# Patient Record
Sex: Female | Born: 1950 | Race: White | Hispanic: No | Marital: Married | Smoking: Former smoker
Health system: Southern US, Community
[De-identification: ages and names within clinical notes are randomized; demographics above are authoritative.]

## PROBLEM LIST (undated history)

## (undated) DIAGNOSIS — M797 Fibromyalgia: Secondary | ICD-10-CM

## (undated) DIAGNOSIS — M19072 Primary osteoarthritis, left ankle and foot: Secondary | ICD-10-CM

## (undated) DIAGNOSIS — F419 Anxiety disorder, unspecified: Secondary | ICD-10-CM

## (undated) DIAGNOSIS — F32A Depression, unspecified: Secondary | ICD-10-CM

## (undated) DIAGNOSIS — E785 Hyperlipidemia, unspecified: Secondary | ICD-10-CM

## (undated) DIAGNOSIS — F329 Major depressive disorder, single episode, unspecified: Secondary | ICD-10-CM

## (undated) DIAGNOSIS — D1803 Hemangioma of intra-abdominal structures: Secondary | ICD-10-CM

## (undated) DIAGNOSIS — I219 Acute myocardial infarction, unspecified: Secondary | ICD-10-CM

## (undated) DIAGNOSIS — K760 Fatty (change of) liver, not elsewhere classified: Secondary | ICD-10-CM

## (undated) DIAGNOSIS — T7840XA Allergy, unspecified, initial encounter: Secondary | ICD-10-CM

## (undated) DIAGNOSIS — N3281 Overactive bladder: Secondary | ICD-10-CM

## (undated) DIAGNOSIS — M7731 Calcaneal spur, right foot: Secondary | ICD-10-CM

## (undated) DIAGNOSIS — J45909 Unspecified asthma, uncomplicated: Secondary | ICD-10-CM

## (undated) DIAGNOSIS — M1990 Unspecified osteoarthritis, unspecified site: Secondary | ICD-10-CM

## (undated) DIAGNOSIS — K5790 Diverticulosis of intestine, part unspecified, without perforation or abscess without bleeding: Secondary | ICD-10-CM

## (undated) DIAGNOSIS — D251 Intramural leiomyoma of uterus: Secondary | ICD-10-CM

## (undated) DIAGNOSIS — K219 Gastro-esophageal reflux disease without esophagitis: Secondary | ICD-10-CM

## (undated) DIAGNOSIS — M19071 Primary osteoarthritis, right ankle and foot: Secondary | ICD-10-CM

## (undated) DIAGNOSIS — I639 Cerebral infarction, unspecified: Secondary | ICD-10-CM

## (undated) DIAGNOSIS — I10 Essential (primary) hypertension: Secondary | ICD-10-CM

## (undated) DIAGNOSIS — K648 Other hemorrhoids: Secondary | ICD-10-CM

## (undated) DIAGNOSIS — Z860101 Personal history of adenomatous and serrated colon polyps: Secondary | ICD-10-CM

## (undated) DIAGNOSIS — K222 Esophageal obstruction: Secondary | ICD-10-CM

## (undated) DIAGNOSIS — M7732 Calcaneal spur, left foot: Secondary | ICD-10-CM

## (undated) DIAGNOSIS — J40 Bronchitis, not specified as acute or chronic: Secondary | ICD-10-CM

## (undated) DIAGNOSIS — G250 Essential tremor: Secondary | ICD-10-CM

## (undated) DIAGNOSIS — K2270 Barrett's esophagus without dysplasia: Secondary | ICD-10-CM

## (undated) DIAGNOSIS — Z86010 Personal history of colonic polyps: Secondary | ICD-10-CM

## (undated) HISTORY — DX: Hyperlipidemia, unspecified: E78.5

## (undated) HISTORY — DX: Allergy, unspecified, initial encounter: T78.40XA

## (undated) HISTORY — DX: Primary osteoarthritis, left ankle and foot: M19.072

## (undated) HISTORY — DX: Barrett's esophagus without dysplasia: K22.70

## (undated) HISTORY — DX: Fibromyalgia: M79.7

## (undated) HISTORY — DX: Other hemorrhoids: K64.8

## (undated) HISTORY — DX: Personal history of colonic polyps: Z86.010

## (undated) HISTORY — DX: Esophageal obstruction: K22.2

## (undated) HISTORY — DX: Calcaneal spur, right foot: M77.31

## (undated) HISTORY — DX: Bronchitis, not specified as acute or chronic: J40

## (undated) HISTORY — DX: Hemangioma of intra-abdominal structures: D18.03

## (undated) HISTORY — DX: Primary osteoarthritis, right ankle and foot: M19.071

## (undated) HISTORY — DX: Anxiety disorder, unspecified: F41.9

## (undated) HISTORY — DX: Calcaneal spur, left foot: M77.32

## (undated) HISTORY — DX: Fatty (change of) liver, not elsewhere classified: K76.0

## (undated) HISTORY — DX: Diverticulosis of intestine, part unspecified, without perforation or abscess without bleeding: K57.90

## (undated) HISTORY — DX: Intramural leiomyoma of uterus: D25.1

## (undated) HISTORY — PX: OTHER SURGICAL HISTORY: SHX169

## (undated) HISTORY — PX: INCONTINENCE SURGERY: SHX676

## (undated) HISTORY — DX: Personal history of adenomatous and serrated colon polyps: Z86.0101

## (undated) HISTORY — DX: Cerebral infarction, unspecified: I63.9

## (undated) HISTORY — DX: Depression, unspecified: F32.A

## (undated) HISTORY — DX: Essential (primary) hypertension: I10

## (undated) HISTORY — DX: Acute myocardial infarction, unspecified: I21.9

## (undated) HISTORY — DX: Unspecified asthma, uncomplicated: J45.909

## (undated) HISTORY — DX: Gastro-esophageal reflux disease without esophagitis: K21.9

## (undated) HISTORY — DX: Unspecified osteoarthritis, unspecified site: M19.90

## (undated) HISTORY — DX: Major depressive disorder, single episode, unspecified: F32.9

## (undated) HISTORY — PX: TUBAL LIGATION: SHX77

## (undated) HISTORY — PX: ABDOMINAL HYSTERECTOMY: SHX81

---

## 1998-05-15 ENCOUNTER — Other Ambulatory Visit: Admission: RE | Admit: 1998-05-15 | Discharge: 1998-05-15 | Payer: Self-pay | Admitting: Obstetrics and Gynecology

## 1999-11-07 ENCOUNTER — Other Ambulatory Visit: Admission: RE | Admit: 1999-11-07 | Discharge: 1999-11-07 | Payer: Self-pay | Admitting: Obstetrics and Gynecology

## 1999-12-02 ENCOUNTER — Emergency Department (HOSPITAL_COMMUNITY): Admission: EM | Admit: 1999-12-02 | Discharge: 1999-12-02 | Payer: Self-pay | Admitting: Emergency Medicine

## 1999-12-03 ENCOUNTER — Encounter: Admission: RE | Admit: 1999-12-03 | Discharge: 1999-12-03 | Payer: Self-pay | Admitting: Family Medicine

## 1999-12-03 ENCOUNTER — Encounter: Payer: Self-pay | Admitting: Family Medicine

## 2000-10-01 ENCOUNTER — Encounter: Payer: Self-pay | Admitting: Obstetrics and Gynecology

## 2000-10-01 ENCOUNTER — Encounter: Admission: RE | Admit: 2000-10-01 | Discharge: 2000-10-01 | Payer: Self-pay | Admitting: Obstetrics and Gynecology

## 2001-12-15 ENCOUNTER — Other Ambulatory Visit: Admission: RE | Admit: 2001-12-15 | Discharge: 2001-12-15 | Payer: Self-pay | Admitting: Obstetrics and Gynecology

## 2002-05-16 ENCOUNTER — Emergency Department (HOSPITAL_COMMUNITY): Admission: EM | Admit: 2002-05-16 | Discharge: 2002-05-17 | Payer: Self-pay | Admitting: Emergency Medicine

## 2002-05-16 ENCOUNTER — Encounter: Payer: Self-pay | Admitting: Emergency Medicine

## 2003-01-09 ENCOUNTER — Other Ambulatory Visit: Admission: RE | Admit: 2003-01-09 | Discharge: 2003-01-09 | Payer: Self-pay | Admitting: Obstetrics and Gynecology

## 2004-03-25 ENCOUNTER — Other Ambulatory Visit: Admission: RE | Admit: 2004-03-25 | Discharge: 2004-03-25 | Payer: Self-pay | Admitting: Obstetrics and Gynecology

## 2004-05-16 ENCOUNTER — Encounter: Payer: Self-pay | Admitting: Internal Medicine

## 2004-09-01 DIAGNOSIS — I639 Cerebral infarction, unspecified: Secondary | ICD-10-CM

## 2004-09-01 HISTORY — DX: Cerebral infarction, unspecified: I63.9

## 2005-01-13 ENCOUNTER — Encounter (INDEPENDENT_AMBULATORY_CARE_PROVIDER_SITE_OTHER): Payer: Self-pay | Admitting: *Deleted

## 2005-01-13 ENCOUNTER — Emergency Department (HOSPITAL_COMMUNITY): Admission: EM | Admit: 2005-01-13 | Discharge: 2005-01-14 | Payer: Self-pay | Admitting: Emergency Medicine

## 2005-03-21 ENCOUNTER — Ambulatory Visit: Payer: Self-pay | Admitting: Family Medicine

## 2005-03-27 ENCOUNTER — Ambulatory Visit: Payer: Self-pay | Admitting: Family Medicine

## 2005-04-09 ENCOUNTER — Ambulatory Visit: Payer: Self-pay | Admitting: Neurology

## 2005-04-19 ENCOUNTER — Emergency Department (HOSPITAL_COMMUNITY): Admission: EM | Admit: 2005-04-19 | Discharge: 2005-04-19 | Payer: Self-pay | Admitting: Emergency Medicine

## 2005-04-25 ENCOUNTER — Ambulatory Visit: Payer: Self-pay | Admitting: Family Medicine

## 2005-05-08 ENCOUNTER — Ambulatory Visit (HOSPITAL_COMMUNITY): Admission: RE | Admit: 2005-05-08 | Discharge: 2005-05-08 | Payer: Self-pay | Admitting: Neurology

## 2005-05-08 ENCOUNTER — Encounter (INDEPENDENT_AMBULATORY_CARE_PROVIDER_SITE_OTHER): Payer: Self-pay | Admitting: Cardiology

## 2005-05-30 ENCOUNTER — Ambulatory Visit (HOSPITAL_BASED_OUTPATIENT_CLINIC_OR_DEPARTMENT_OTHER): Admission: RE | Admit: 2005-05-30 | Discharge: 2005-05-30 | Payer: Self-pay | Admitting: Neurology

## 2005-08-14 ENCOUNTER — Emergency Department (HOSPITAL_COMMUNITY): Admission: EM | Admit: 2005-08-14 | Discharge: 2005-08-14 | Payer: Self-pay | Admitting: Emergency Medicine

## 2006-05-26 ENCOUNTER — Encounter: Payer: Self-pay | Admitting: Internal Medicine

## 2006-05-26 ENCOUNTER — Ambulatory Visit: Payer: Self-pay | Admitting: Gastroenterology

## 2006-10-16 ENCOUNTER — Ambulatory Visit: Payer: Self-pay | Admitting: Specialist

## 2006-10-22 ENCOUNTER — Ambulatory Visit: Payer: Self-pay | Admitting: Specialist

## 2006-11-12 ENCOUNTER — Ambulatory Visit: Payer: Self-pay | Admitting: Internal Medicine

## 2006-11-17 ENCOUNTER — Encounter (INDEPENDENT_AMBULATORY_CARE_PROVIDER_SITE_OTHER): Payer: Self-pay | Admitting: *Deleted

## 2006-11-17 ENCOUNTER — Ambulatory Visit (HOSPITAL_COMMUNITY): Admission: RE | Admit: 2006-11-17 | Discharge: 2006-11-17 | Payer: Self-pay | Admitting: Internal Medicine

## 2006-12-09 ENCOUNTER — Ambulatory Visit: Payer: Self-pay | Admitting: Internal Medicine

## 2006-12-09 ENCOUNTER — Encounter (INDEPENDENT_AMBULATORY_CARE_PROVIDER_SITE_OTHER): Payer: Self-pay | Admitting: *Deleted

## 2006-12-09 ENCOUNTER — Encounter (INDEPENDENT_AMBULATORY_CARE_PROVIDER_SITE_OTHER): Payer: Self-pay | Admitting: Specialist

## 2006-12-09 DIAGNOSIS — K222 Esophageal obstruction: Secondary | ICD-10-CM | POA: Insufficient documentation

## 2006-12-09 DIAGNOSIS — K5730 Diverticulosis of large intestine without perforation or abscess without bleeding: Secondary | ICD-10-CM | POA: Insufficient documentation

## 2006-12-09 DIAGNOSIS — K2270 Barrett's esophagus without dysplasia: Secondary | ICD-10-CM | POA: Insufficient documentation

## 2006-12-09 DIAGNOSIS — K219 Gastro-esophageal reflux disease without esophagitis: Secondary | ICD-10-CM | POA: Insufficient documentation

## 2006-12-09 DIAGNOSIS — D126 Benign neoplasm of colon, unspecified: Secondary | ICD-10-CM | POA: Insufficient documentation

## 2007-01-03 ENCOUNTER — Emergency Department (HOSPITAL_COMMUNITY): Admission: EM | Admit: 2007-01-03 | Discharge: 2007-01-03 | Payer: Self-pay | Admitting: Emergency Medicine

## 2007-01-03 ENCOUNTER — Ambulatory Visit: Payer: Self-pay | Admitting: Vascular Surgery

## 2007-01-03 ENCOUNTER — Encounter: Payer: Self-pay | Admitting: Vascular Surgery

## 2007-01-19 ENCOUNTER — Encounter: Payer: Self-pay | Admitting: Physician Assistant

## 2007-02-05 ENCOUNTER — Ambulatory Visit: Payer: Self-pay | Admitting: Internal Medicine

## 2007-02-12 ENCOUNTER — Encounter: Admission: RE | Admit: 2007-02-12 | Discharge: 2007-02-12 | Payer: Self-pay | Admitting: Internal Medicine

## 2007-02-22 ENCOUNTER — Emergency Department (HOSPITAL_COMMUNITY): Admission: EM | Admit: 2007-02-22 | Discharge: 2007-02-22 | Payer: Self-pay | Admitting: Emergency Medicine

## 2009-03-06 ENCOUNTER — Ambulatory Visit: Payer: Self-pay

## 2009-03-22 ENCOUNTER — Encounter: Admission: RE | Admit: 2009-03-22 | Discharge: 2009-03-22 | Payer: Self-pay | Admitting: Obstetrics and Gynecology

## 2009-08-16 ENCOUNTER — Encounter: Admission: RE | Admit: 2009-08-16 | Discharge: 2009-08-16 | Payer: Self-pay | Admitting: Neurology

## 2009-09-24 ENCOUNTER — Ambulatory Visit: Payer: Self-pay | Admitting: Psychology

## 2009-11-02 ENCOUNTER — Encounter (INDEPENDENT_AMBULATORY_CARE_PROVIDER_SITE_OTHER): Payer: Self-pay | Admitting: *Deleted

## 2009-11-07 ENCOUNTER — Telehealth: Payer: Self-pay | Admitting: Internal Medicine

## 2009-11-12 ENCOUNTER — Encounter: Payer: Self-pay | Admitting: Internal Medicine

## 2009-11-20 ENCOUNTER — Ambulatory Visit: Payer: Self-pay | Admitting: Psychology

## 2009-11-21 ENCOUNTER — Encounter (INDEPENDENT_AMBULATORY_CARE_PROVIDER_SITE_OTHER): Payer: Self-pay | Admitting: *Deleted

## 2009-12-26 ENCOUNTER — Ambulatory Visit: Payer: Self-pay | Admitting: Internal Medicine

## 2010-04-08 ENCOUNTER — Encounter: Admission: RE | Admit: 2010-04-08 | Discharge: 2010-04-08 | Payer: Self-pay | Admitting: Obstetrics and Gynecology

## 2010-04-17 ENCOUNTER — Encounter: Payer: Self-pay | Admitting: Internal Medicine

## 2010-04-23 ENCOUNTER — Ambulatory Visit: Payer: Self-pay | Admitting: Internal Medicine

## 2010-04-23 DIAGNOSIS — R197 Diarrhea, unspecified: Secondary | ICD-10-CM | POA: Insufficient documentation

## 2010-04-23 DIAGNOSIS — I679 Cerebrovascular disease, unspecified: Secondary | ICD-10-CM | POA: Insufficient documentation

## 2010-04-30 ENCOUNTER — Ambulatory Visit: Payer: Self-pay | Admitting: Internal Medicine

## 2010-05-01 ENCOUNTER — Encounter: Payer: Self-pay | Admitting: Internal Medicine

## 2010-05-01 ENCOUNTER — Telehealth: Payer: Self-pay | Admitting: Internal Medicine

## 2010-09-22 ENCOUNTER — Encounter: Payer: Self-pay | Admitting: Internal Medicine

## 2010-10-03 NOTE — Progress Notes (Signed)
Summary: EGD?  Phone Note Call from Patient Call back at Work Phone [phone]   Caller: Patient Call For: Dr. Marina Goodell Reason for Call: Talk to Nurse Summary of Call: pt received recall letter for EGD, but is currently experiencing dysphagia... should she be seen in the office first?... please advise pt Initial call taken by: Vallarie Mare,  November 07, 2009 12:19 PM  Follow-up for Phone Call        Having mild dysphagia with solids .Got letter for recall for Barrett's ssys she feels esophageus need stretched.Do you want her to have ov first?. Follow-up by: Teryl Lucy RN,  November 07, 2009 12:32 PM  Additional Follow-up for Phone Call Additional follow up Details #1::        It depends. In reviewing her prior office note, I saw that she was on Plavix. If she is still on Plavix, or any other important medication as it relates to endoscopy, or has unstable significant medical problems, then she needs to be seen in the office. However, if her chronic medical problems are stable and she is on no significant medications (as it relates to endoscopy) then a direct EGD in the [facility] with plans for biopsy and dilation would be okay. She would of course, see a previous address first. Additional Follow-up by: Hilarie Fredrickson MD,  November 07, 2009 12:38 PM    Additional Follow-up for Phone Call Additional follow up Details #2::     Pt. 's message says she is not available at this time   Teryl Lucy RN  November 08, 2009 9:32 AM  Message left with husband for pt. to call back   Teryl Lucy RN  November 09, 2009 9:22 AM letter sent to pt. to call as she has not returned my call. Follow-up by: Teryl Lucy RN,  November 12, 2009 9:42 AM   Appended Document: EGD? Pt. scheduled for office visit as she is on Plavix.

## 2010-10-03 NOTE — Progress Notes (Signed)
Summary: follow up call,pt co hand discomfort  ---- Converted from flag ---- ---- 05/01/2010 7:56 AM, Weston Brass wrote: Dr. Marina Goodell, Follow up call this am. Pt. c/o pain in rt. hand with swelling. Pt. sta5ting her pain is 8/10. Pain mostly in thumb and wrist. Pt. denies redness or streaking.Pt. stating she did not have discomfort on insertion but did notice discomfort during injection of sedating meds. Pt.'s IV was a # 24 in rt. hand. No documentation of co pain or discomfort in nursing notes. Pt. received a total of 1000 ml of ns. Documented site without swelling on dc of iv. I instructed pt. to elevate and apply ice as needed. Please advise any additional orders. Thanks Dr. Marina Goodell, Weston Brass RN,BSN ------------------------------  Phone Note Outgoing Call   Summary of Call: nursing encounter noted. Agree with ice. She could also take anti-inflammatories such as Advil. Would expect discomfort to improve in a few days. If not she can let us know Initial call taken by: Hilarie Fredrickson MD,  May 01, 2010 9:04 AM     Appended Document: follow up call,pt co hand discomfort 12:15Follow up call placed to pt. Pt. stating it is still uncomfortable. Pt stating "I think what happened is that when I was getting my sedation they nurse was having trouble with the IV line and told me I would receive sedation once she got the air out of the IV line. I think that is why I am having this trouble, I got air in my line." Discussed with pt. Dr. Lamar Sprinkles orders, continue with ice, and add Advil for discomfort with follow up call to MD if pain persists  longer than a few days. Pt. stating she cannot take Advil related to Plavix. Instructed pt. to take Tylenol. Pt. agreed to instructions and verbalized understanding. Agreed to inform MD of any continued problems. Weston Brass RN

## 2010-10-03 NOTE — Letter (Signed)
Summary: Patient Notice-Barrett's [facility]  [address] [place], [place] [address]   Phone: [phone]  Fax: [phone]        May 01, 2010 MRN: [id number]    [facility] [place] [address] [place], [place]  [address]    Dear Ms. Nowaczyk,  I am pleased to inform you that the biopsies taken during your recent endoscopic examination did not show any evidence of cancer upon pathologic examination.  However, your biopsies indicate you have a condition known as Barrett's esophagus. While not cancer, it is pre-cancerous (can progress to cancer) and needs to be monitored with repeat endoscopic examination and biopsies.  Fortunately, it is quite rare that this develops into cancer, but careful monitoring of the condition along with taking your medication as prescribed is important in reducing the risk of developing cancer.  It is my recommendation that you have a repeat upper gastrointestinal endoscopic examination in 3 years.  Additional information/recommendations:  __Please call [phone] to schedule a return visit to further      evaluate your condition.  __Continue with treatment plan as outlined the day of your exam.  Please call us if you have or develop heartburn, reflux symptoms, any swallowing problems, or if you have questions about your condition that have not been fully answered at this time.  Sincerely,  Hilarie Fredrickson MD  This letter has been electronically signed by your physician.  Appended Document: Patient Notice-Barrett's Esopghagus letter mailed 9.2.11

## 2010-10-03 NOTE — Letter (Signed)
Summary: Appt Reminder 2  [facility]  [address] [place], [place] [address]   Phone: [phone]  Fax: [phone]        November 21, 2009 MRN: [id number]    [facility] [place] [address] [place], [place]  [address]    Dear Ms. Hosmer,   You have a return appointment with Dr. Yancey Flemings on Thursday 12/13/2009 at 3:45 p.m.  Please remember to bring a complete list of the medicines you are taking, your insurance card and your co-pay.  If you have to cancel or reschedule this appointment, please call before 5:00 pm the evening before to avoid a cancellation fee.  If you have any questions or concerns, please call [phone].    Sincerely,    Teryl Lucy RN

## 2010-10-03 NOTE — Procedures (Signed)
Summary: Upper Endoscopy  Patient: Robin Bryan Note: All result statuses are Final unless otherwise noted.  Tests: (1) Upper Endoscopy (EGD)   EGD Upper Endoscopy       DONE     [facility]     [address]     [place], [place]  [address]           ENDOSCOPY PROCEDURE REPORT           PATIENT:  [name], [name]  MR#:  [id number]     BIRTHDATE:  09/27/1950, 59 yrs. old  GENDER:  female           ENDOSCOPIST:  Wilhemina Bonito. Eda Keys, MD     Referred by:  Office           PROCEDURE DATE:  04/30/2010     PROCEDURE:  EGD with biopsy,     Elease Hashimoto Dilation of Esophagus 46F     ASA CLASS:  Class III     INDICATIONS:  h/o Barrett's Esophagus, dysphagia           MEDICATIONS:   Fentanyl 100 mcg IV, Versed 6 mg IV     TOPICAL ANESTHETIC:  Exactacain Spray           DESCRIPTION OF PROCEDURE:   After the risks benefits and     alternatives of the procedure were thoroughly explained, informed     consent was obtained.  The LB GIF-H180 [id number] endoscope was     introduced through the mouth and advanced to the second portion of     the duodenum, without limitations.  The instrument was slowly     withdrawn as the mucosa was fully examined.     <<PROCEDUREIMAGES>>           2cm segment of Barrett's esophagus was found in the distal     esophagus. Multiple bx taken.  The stomach was entered and closely     examined. The antrum, angularis, and lesser curvature were well     visualized, including a retroflexed view of the cardia and fundus.     The stomach wall was normally distensable. The scope passed easily     through the pylorus into the duodenum.  The duodenal bulb was     normal in appearance, as was the postbulbar duodenum.     Retroflexed views revealed no abnormalities.    The scope was then     withdrawn from the patient and the procedure completed.           THERAPY: 54 F MALONEY DILATION W/O RESISTANCE . + HEME (post-bx)           COMPLICATIONS:  None           ENDOSCOPIC  IMPRESSION:     1) Barrett's esophagus in the distal esophagus     2) Normal stomach     3) Normal duodenum     4) S/P Empiric Maloney dilation 46F           RECOMMENDATIONS:     1) Clear liquids until 2 pm, then soft foods rest of day. Resume     prior diet tomorrow.     2) Continue current medications for GERD     3) Resume Plavix today     4) Repeat EGD in 3 years if no dysplasia on biopsies           ______________________________     Wilhemina Bonito. Eda Keys, MD  CC:  Wonda Cheng, MD; The Patient           n.     eSIGNED:   Wilhemina Bonito. Eda Keys at 04/30/2010 12:20 PM           Armandina Stammer, [id number]  Note: An exclamation mark (!) indicates a result that was not dispersed into the flowsheet. Document Creation Date: 04/30/2010 12:22 PM _______________________________________________________________________  (1) Order result status: Final Collection or observation date-time: 04/30/2010 12:09 Requested date-time:  Receipt date-time:  Reported date-time:  Referring Physician:   Ordering Physician: Fransico Setters [id number]) Specimen Source:  Source: Launa Grill Order Number: [id number] Lab site:   Appended Document: Upper Endoscopy     Procedures Next Due Date:    EGD: 05/2013

## 2010-10-03 NOTE — Procedures (Signed)
Summary: Upper GI Endoscopy/[facility]  Upper GI Endoscopy/[facility]   Imported By: Sherian Rein 12/27/2009 14:22:34  _____________________________________________________________________  External Attachment:    Type:   Image     Comment:   External Document

## 2010-10-03 NOTE — Miscellaneous (Signed)
Summary: Medication Update  Clinical Lists Changes  Medications: Added new medication of BENICAR 20 MG TABS (OLMESARTAN MEDOXOMIL) 1 by mouth once daily Added new medication of NEXIUM 40 MG CPDR (ESOMEPRAZOLE MAGNESIUM) 1 capsule by mouth once daily Added new medication of PLAVIX 75 MG TABS (CLOPIDOGREL BISULFATE) 1 by mouth once daily Added new medication of CRESTOR 10 MG TABS (ROSUVASTATIN CALCIUM) 1 by mouth once daily Added new medication of ASPIRIN 325 MG TABS (ASPIRIN) 1 by mouth once daily

## 2010-10-03 NOTE — Letter (Signed)
Summary: Endoscopy Letter  [facility]  [address] [place], [place] [address]   Phone: [phone]  Fax: [phone]      November 02, 2009 MRN: [id number]   [facility] [place] [address] [place], [place]  [address]   Dear Ms. Hargreaves,   According to your medical record, it is time for you to schedule an Endoscopy. Endoscopic screening is recommended for patients with certain upper digestive tract conditions because of associated increased risk for cancers of the upper digestive system.  This letter has been generated based on the recommendations made at the time of your prior procedure. If you feel that in your particular situation this may no longer apply, please contact our office.  Please call our office at [phone]) to schedule this appointment or to update your records at your earliest convenience.  Thank you for cooperating with [place] to provide you with the very best care possible.   Sincerely,  Wilhemina Bonito. Marina Goodell, M.D.  [facility] [phone]

## 2010-10-03 NOTE — Letter (Signed)
Summary: Appointment Reminder  [facility]  [address] [place], [place] [address]   Phone: [phone]  Fax: [phone]        November 12, 2009 MRN: [id number]    [facility] [place] [address] [place], [place]  [address]    Dear [name],   We have been unable to reach you by phone to schedule an EGD.Dr.Abdulkadir Emmanuel has several questions about your current health history before he can determine if an office visit is needed prior to your endoscopy.Please call our office at [phone] and ask for his nurse-Cheryl. We hope that you allow [place] to participate in your health care needs.     Sincerely,    Teryl Lucy RN

## 2010-10-03 NOTE — Letter (Signed)
Summary: EGD Instructions  [facility]  [address] [place], [place] [address]   Phone: [phone]  Fax: [phone]       KEISA BLOW    17-Nov-1950    MRN: [id number]       Procedure Day /Date:TUESDAY 04/30/10     Arrival Time: 10:30 AM     Procedure Time:11:30 AM     Location of Procedure:                    [facility] (4th Floor)   PREPARATION FOR ENDOSCOPY/DILT   04/30/10 TUESDAY  THE DAY OF THE PROCEDURE:  , 1.   No solid foods, milk or milk products are allowed after midnight the night before your procedure.  2.   Do not drink anything colored red or purple.  Avoid juices with pulp.  No orange juice.  3.  You may drink clear liquids until 9:30 AM which is 2 hours before your procedure.                                                                                                CLEAR LIQUIDS INCLUDE: Water Jello Ice Popsicles Tea (sugar ok, no milk/cream) Powdered fruit flavored drinks Coffee (sugar ok, no milk/cream) Gatorade Juice: apple, white grape, white cranberry  Lemonade Clear bullion, consomm, broth Carbonated beverages (any kind) Strained chicken noodle soup Hard Candy   MEDICATION INSTRUCTIONS  Unless otherwise instructed, you should take regular prescription medications with a small sip of water as early as possible the morning of your procedure.   Stop taking Plavix  on  04/25/10 (5 days before procedure).      Additional medication instructions: _ START TAKING BABY ASPIRIN TODAY.             OTHER INSTRUCTIONS  You will need a responsible adult at least 58 years of age to accompany you and drive you home.   This person must remain in the waiting room during your procedure.  Wear loose fitting clothing that is easily removed.  Leave jewelry and other valuables at home.  However, you may wish to bring a book to read or an iPod/MP3 player to listen to music as you wait for your procedure to  start.  Remove all body piercing jewelry and leave at home.  Total time from sign-in until discharge is approximately 2-3 hours.  You should go home directly after your procedure and rest.  You can resume normal activities the day after your procedure.  The day of your procedure you should not:   Drive   Make legal decisions   Operate machinery   Drink alcohol   Return to work  You will receive specific instructions about eating, activities and medications before you leave.    The above instructions have been reviewed and explained to me by   _______________________    I fully understand and can verbalize these instructions _____________________________ Date _________

## 2010-10-03 NOTE — Assessment & Plan Note (Signed)
Summary: recall Endo (on Plavix) , dark stool   History of Present Illness Visit Type: new patient Primary GI MD: Yancey Flemings MD Primary Provider: Deberah Pelton, MD Chief Complaint: dysphagia-solid and liquid, pt is also having abdominal pain and diarrhea x 4 days History of Present Illness:   60 year old female with multiple medical problems including hypertension, hyperlipidemia, cerebrovascular disease with prior TIA, anxiety disorder, GERD complicated by Barrett esophagus and peptic stricture, degenerative joint disease, and adenomatous colon polyps. She was last seen in June of 2008 after having undergone both upper endoscopy and colonoscopy. As well imaging of the liver to characterize benign lesions. She was to continue Nexium and followup in one year. She has not followed up until this time after receiving a recall letter regarding her upper endoscopy surveillance. She is on Plavix therapy, and had been on Plavix at the time of her last procedures (which was stopped). She said no significant interval medical problems or change in her medical history. She does complain of recurrent intermittent solid and occasional liquid dysphagia. She also reports to me 3-4 day history of cramping abdominal pain associated with loose stools and dark diarrhea. She took Imodium yesterday and has not moved her bowels since. No nausea vomiting. Occasional breakthrough reflux for which she takes an additional Nexium. Chronic Celebrex but no other NSAIDs or aspirin.   GI Review of Systems    Reports abdominal pain, acid reflux, belching, chest pain, dysphagia with liquids, and  dysphagia with solids.      Denies bloating, heartburn, loss of appetite, nausea, vomiting, vomiting blood, weight loss, and  weight gain.      Reports black tarry stools, diarrhea, diverticulosis, and  irritable bowel syndrome.     Denies anal fissure, change in bowel habit, constipation, fecal incontinence, heme positive stool,  hemorrhoids, jaundice, light color stool, liver problems, rectal bleeding, and  rectal pain. Preventive Screening-Counseling & Management  Alcohol-Tobacco     Smoking Status: quit      Drug Use:  no.      Current Medications (verified): 1)  Nexium 40 Mg Cpdr (Esomeprazole Magnesium) .Marland Kitchen.. 1 Capsule By Mouth Once Daily 2)  Plavix 75 Mg Tabs (Clopidogrel Bisulfate) .Marland Kitchen.. 1 By Mouth Once Daily 3)  Simvastatin 10 Mg Tabs (Simvastatin) .... Take 1 Tablet By Mouth Once A Day 4)  Cozaar 25 Mg Tabs (Losartan Potassium) .... Take 1 Tablet By Mouth Once A Day 5)  Toviaz 4 Mg Xr24h-Tab (Fesoterodine Fumarate) .... Take 1 Tablet By Mouth Once A Day 6)  Celebrex 200 Mg Caps (Celecoxib) .... As Needed For Joint Pain 7)  Zoloft 100 Mg Tabs (Sertraline Hcl) .... Take 1 Tablet By Mouth Once A Day  Allergies (verified): No Known Drug Allergies  Past History:  Past Medical History: Current Problems:  GERD (ICD-530.81) ESOPHAGEAL STRICTURE (ICD-530.3) BARRETTS ESOPHAGUS (ICD-530.85) COLONIC POLYPS, ADENOMATOUS (ICD-211.3) DIVERTICULOSIS, COLON (ICD-562.10) Anxiety Disorder Hyperlipidemia Hypertension Stroke (mini) 2005  Past Surgical History: hysterectomy Tubal Ligation bladder sling w/rectal repair  Family History: Family History of Ovarian Cancer: Sister Family History of Colon Polyps: Mother Family History of Diabetes: Mother, sister  Social History: Married, 1 girl, 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  Alcohol Use - no Illicit Drug Use - no Smoking Status:  quit Drug Use:  no  Review of Systems       The patient complains of sore throat.  The patient denies allergy/sinus, anemia, anxiety-new, arthritis/joint pain, back pain, blood in urine, breast changes/lumps, confusion, cough,  coughing up blood, depression-new, fainting, fatigue, fever, headaches-new, hearing problems, heart murmur, heart rhythm changes, itching, menstrual pain, muscle pains/cramps, night sweats,  nosebleeds, pregnancy symptoms, shortness of breath, skin rash, sleeping problems, swelling of feet/legs, swollen lymph glands, thirst - excessive, urination - excessive, urination changes/pain, urine leakage, vision changes, and voice change.    Vital Signs:  Patient profile:   60 year old female Height:      68 inches Weight:      214 pounds BMI:     32.66 Pulse rate:   76 / minute Pulse rhythm:   regular BP sitting:   120 / 82  (left arm) Cuff size:   regular  Vitals Entered By: Francee Piccolo CMA Duncan Dull) (April 23, 2010 10:17 AM)  Physical Exam  General:  Well developed, well nourished, no acute distress. Head:  Normocephalic and atraumatic. Eyes:  PERRLA, no icterus. Mouth:  No deformity or lesions Lungs:  Clear throughout to auscultation. Heart:  Regular rate and rhythm; no murmurs, rubs,  or bruits. Abdomen:  Soft, obese,nontender and nondistended. No masses, hepatosplenomegaly or hernias noted. Normal bowel sounds. Rectal:  Normal exam without mass or tenderness. Stool is soft brown and Hemoccult negative. Msk:  Symmetrical with no gross deformities. Normal posture. Pulses:  Normal pulses noted. Extremities:  No clubbing, cyanosis, edema or deformities noted. Neurologic:  Alert and  oriented x4;  grossly normal neurologically. Skin:  Intact without significant lesions or rashes. Psych:  Alert and cooperative. Normal mood and affect.   Impression & Recommendations:  Problem # 1:  BARRETTS ESOPHAGUS (ICD-530.85) Barrett's esophagus without dysplasia. Last upper endoscopy 3 years ago. Due for routine surveillance.  Plan: #1. Upper endoscopy with surveillance biopsies. The nature of the procedure as well as the risks, benefits, and alternatives were reviewed. She understood and agreed to proceed #2. The patient is at high risk given her comorbidities and the need to address her Plavix therapy. We discussed the potential for bleeding complications with biopsies and  dilation. As well the possible risk for cerebrovascular event off drug temporarily. After weighing these considerations, patient wished to hold her Plavix 5 days prior to the procedure as was done previously. I instructed her to begin a baby aspirin one week prior to the procedures.  Problem # 2:  ESOPHAGEAL STRICTURE (ICD-530.3) now with significant recurrent dysphagia likely due to known peptic stricture.. She benefited previously from dilation and is interested in the same therapy.  Plan: #1. Esophageal dilation. The nature of the procedure as well as the risks, benefits, and alternatives were reviewed. She understood and agreed to proceed. #2. See above discussion regarding Plavix therapy  Problem # 3:  CEREBROVASCULAR DISEASE (ICD-437.9) history of TIA on Plavix. See above discussion regarding its impact on procedure work and the accompanying plan  Problem # 4:  GERD (ICD-530.81) on Nexium daily. Some breakthrough symptoms.in addition to continue Nexium, optimizing the time that it is taken (in a.m. 30 minutes before first meal), and strict adherence to reflux precautions with attention to weight loss  Problem # 5:  COLONIC POLYPS, ADENOMATOUS (ICD-211.3) due for routine followup in 2013.  Problem # 6:  DIARRHEA-PRESUMED INFECTIOUS (ICD-009.3) complaints dark loose stools and abdominal cramping. Somewhat better today without bowel movement. No evidence for blood on rectal exam. Suspect self-limited infectious diarrhea, likely viral. Supportive care only at this point.  Other Orders: EGD SAV (EGD SAV)  Patient Instructions: 1)  EGD with [facility] 04/30/10 11:30 am arrive at 10:30 am 2)  Hold Plavix 5 days prior starting 04/25/10 3)  Start baby aspirin today. 4)  Upper Endoscopy brochure given.  5)  The medication list was reviewed and reconciled.  All changed / newly prescribed medications were explained.  A complete medication list was provided to the patient / caregiver. 6)  Copy:  Dr. Deberah Pelton

## 2010-10-03 NOTE — Procedures (Signed)
Summary: Upper GI Endoscopy/[facility]  Upper GI Endoscopy/[facility]   Imported By: Sherian Rein 12/27/2009 14:18:07  _____________________________________________________________________  External Attachment:    Type:   Image     Comment:   External Document

## 2010-10-03 NOTE — Procedures (Signed)
Summary: Colonoscopy/[facility]  Colonoscopy/[facility]   Imported By: Sherian Rein 12/27/2009 14:19:48  _____________________________________________________________________  External Attachment:    Type:   Image     Comment:   External Document

## 2011-01-14 NOTE — Assessment & Plan Note (Signed)
[facility]                         GASTROENTEROLOGY OFFICE NOTE   Robin Bryan, Robin Bryan                          MRN:          [id number]  DATE:02/05/2007                            DOB:          1950/12/18    Robin Bryan presents today for followup.  She was evaluated November 12, 2006 for  reflux disease, dysphagia, abnormal hepatic imaging, and surveillance  colonoscopy.  See that dictation for details.  She underwent colonoscopy  December 09, 2006.  She was found to have a diminutive colon polyp and  diverticulosis.  Followup in 5 years recommended.  Upper endoscopy  revealed Barrett's esophagus.  Biopsies did not reveal dysplasia.  She  did have a distal esophageal stricture and was dilated with a 54 [place]  Maloney dilator.  She continues on Nexium for reflux.  She follows up at  this time.  Post procedure, she has done well.  No recurrent dysphagia.  No heartburn.  No lower abdominal complaints.  We have also been  following liver lesion in the right posterior lobe measuring 2.2 cm.  This was last evaluated with CT scan November 17, 2006.  Characteristics  were a little atypical for hemangioma, and followup MRI recommended.   CURRENT MEDICATIONS:  Benicar, Nexium, Plavix.  Crestor.  Aspirin.  Xanax p.r.n.   PHYSICAL EXAM:  Well-appearing female, in no acute distress.  Blood pressure 142/68, heart rate 88, weight 212.8 pounds.  HEENT:  Sclerae anicteric.  LUNGS:  Clear.  HEART:  Regular.  ABDOMEN:  Soft without tenderness.   IMPRESSION:  1. Gastroesophageal reflux disease complicated by Barrett's      esophagus.On chronic Nexium.  2. 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  Currently asymptomatic      post dilation.  3. History of adenomatous colon polyps.  4. Uncharacterized lesion of the right liver.   RECOMMENDATIONS:  1. Continue Nexium.  2. Surveillance endoscopy in 3 years.  3. Surveillance colonoscopy in 5 years.  4. Set up MRI of the liver in  the near future.  I have given her      limitted Xanax to help during the study.  5. Routine GI followup in 1 year assuming she continues to do well.     Wilhemina Bonito. Marina Goodell, MD  Electronically Signed   JNP/MedQ  DD: 02/05/2007  DT: 02/05/2007  Job #: [id number]   cc:   Wonda Cheng

## 2011-01-17 NOTE — Procedures (Signed)
Robin Bryan, Robin Bryan                 ACCOUNT NO.:  [id number]   MEDICAL RECORD NO.:  [id number]          PATIENT TYPE:  OUT   LOCATION:  SLEEP CENTER                 FACILITY:  [facility]   PHYSICIAN:  Melvyn Novas, M.D.  DATE OF BIRTH:  1950/10/19   DATE OF STUDY:  05/30/2005                              NOCTURNAL POLYSOMNOGRAM   [name] underwent this polysomnogram at the [facility] for evaluation of morning headaches and excessive sleepiness. The  patient had indicated that she also suffers from sleep insomnia, having  trouble with sleep initiation as well as sleep maintenance. She found her  sleep nonrefreshing. The question of apnea/hypopneas was raised.   This polysomnogram documents a sleep latency of 23 minutes, after which the  patient reached sleep with an efficiency of 89% for the total recorded time.  The patient wakeful during the night for 73 minutes out of the recorded for  464 minutes. Stage 1 and stage 2 sleep seem to have been over-represented in  review of sleep architecture. The patient showed a very low around of non-  REM sleep stage 3 and 4 at only 1% of the total recorded time, and a mild  decrease in REM sleep at 17% of the total recorded time.   Respiratory review showed a respiratory disturbance index of 6.1 which  increased during REM sleep phases to a disturbance the index of 16.9. The  overall respiratory disturbance index of 6.1 would be rated as mild. All  events seen were obstructive in nature. There were two frank obstructive  apneas and 19 hypopneas. During REM sleep there were six frank apneas and 13  hypopneas. Respiratory events lasted between 19 and 34 seconds and led to  mild oxygen desaturation at nadir of 91%.   Periodic limb movements in sleep were intermittently seen but were not  arousal causing with an a periodic limb movement index of 0.3 events per  hour. Review of the sleep EEG shows medial amplitude normal  shape sleep  architecture that is symmetric and synchronous. EKG shows normal sinus  rhythm throughout the night. The lowest heart rate at 54 beats per minute  occurred during REM sleep and was related to a preceding apnea.   Sleep fragmentation was seen and was related to so-called spontaneous  arousals which do not relate to physiologic factors during sleep. These  arousals occurs most frequently in the first 3 hours of the nocturnal study  but were still not infrequent for the last half of the study.   IMPRESSION:  The study shows fragmented sleep in a 60 year old female whose  medical history was not related to me. The reduction especially in slow wave  sleep is abnormal for a 60 year old female and could be related to a pain  syndrome, fibromyalgia, or psychological factors.           ______________________________  Melvyn Novas, M.D.  Diplomate, Biomedical engineer of Sleep  Medicine     CD/MEDQ  D:  07/02/2005 12:06:48  T:  07/02/2005 14:04:01  Job:  [id number]

## 2011-01-17 NOTE — Assessment & Plan Note (Signed)
HEALTHCARE                         GASTROENTEROLOGY OFFICE NOTE   Robin Bryan, Robin Bryan                          MRN:          [id number]  DATE:11/12/2006                            DOB:          July 27, 1951    REASON FOR CONSULTATION:  Abnormal hepatic imaging, dysphagia, and a  history of colon polyps.   HISTORY:  This is a 60 year old white female with hypertension,  hyperlipidemia, prior transient ischemic attack, for which she is on  Plavix and aspirin, gastroesophageal reflux disease complicated by  Barrett's esophagus, adenomatous colon polyps, and morbid obesity.  She  is referred through the courtesy of Dr. Marguerite Olea regarding the above  listed issues.  First, patient reports longstanding problems with  indigestion and heartburn.  She is currently on Nexium, which controls  her symptoms well.  She does report intermittent problems with solid  food dysphagia, easily causing food impaction.  Upper endoscopy  performed at the [facility] in September of 2005 revealed Barrett's  esophagus.  Biopsies confirmed Barrett's esophagus without dysplasia.  A  followup surveillance endoscopy in September of 2007 again revealed a 4-  cm segment of Barrett's esophagus.  Biopsies were taken, and no  dysplasia found.  Followup in 2 to 3 years recommended.  Patient has  undergone prior screening colonoscopy in September of 2005.  She was  found to have diverticulosis as well as an 8-mm sigmoid colon polyp,  which was removed and found to be an adenoma.  Followup in 2 to 3 years  recommended.  She inquires about followup.  Her GI review of systems  reveals occasional blood on the tissue, which she is not certain  represents  either hemorrhoidal rectal bleeding or possible gynecologic  bleeding.  She is planning to see her gynecologist next week.  She tends  to be constipated, and for this she uses stool softeners.  Finally,  after having had choked on a piece of  broccoli, she was referred to a  pulmonologist.  She underwent a CT scan of the chest.  No acute  pulmonary abnormalities noted.  However, she was incidentally noted to  have a mass-like area in the right lobe of the liver measuring about 1.5  cm in diameter.  To further evaluate this, MRI was recommended and  obtained at [facility] October 22, 2006.  The  exam was said to be limited due to the patient's inability to adequately  hold her breath.  The final impression suggested that the aforementioned  lesion in the liver lightly represents the sequelae of a hemangioma.  As well, there was a smaller lesion near the dome of the liver that was  likely the same.  With some level of uncertainty, consideration of  repeat or different imaging study such as triphasic CT scan was  suggested.  The patient has no symptoms referable to her liver.  Liver  function tests over the past several years have been normal, as have her  complete blood counts.  No personal history of hepatitis.  An ultrasound  obtained in May of 2006 revealed changes  in the liver consistent with  fatty liver.  No lesion was noted or mentioned on that exam.   PAST MEDICAL HISTORY:  1. Hypertension.  2. Asthma.  3. Dyslipidemia.  4. Transient ischemic attack.  5. Anxiety with depression.  6. Chronic headaches.  7. Gastroesophageal reflux disease with Barrett's esophagus.  8. History of adenomatous colon polyps.  9. Incidental diverticulosis.   PAST SURGICAL HISTORY:  1. Hysterectomy (ovaries remain intact).  2. Tubal ligation.   ALLERGIES:  NO KNOWN DRUG ALLERGIES.   CURRENT MEDICATIONS:  1. Benicar 20 mg daily.  2. Nexium 40 mg daily.  3. Plavix 75 mg daily.  4. Aspirin 325 mg daily.  5. Crestor 10 mg daily.  6. She also uses stool softeners p.r.n.  7. Xanax p.r.n.   FAMILY HISTORY:  No family history of gastrointestinal malignancy.  Sister with uterine cancer.   SOCIAL HISTORY:  Patient  is married with 2 children.  She lives with her  spouse.  She is currently employed in Insurance account manager for [employer]  She is a former smoker, though reports quitting about 4 years ago.  She  rarely uses alcohol when on vacation.   REVIEW OF SYSTEMS:  Per diagnostic evaluation form.   PHYSICAL EXAMINATION:  Well-appearing female in no acute distress.  She  is alert and oriented.  Blood pressure is 100/80.  Heart rate 72.  Weight is 218.4 pounds.  She  is 5 feet 8 inches in height.  HEENT:  Sclerae anicteric.  Conjunctivae are pink.  Oral mucosa intact.  There is no adenopathy.  LUNGS:  Clear.  HEART:  Regular.  ABDOMEN:  Obese and soft without tenderness, mass, or hernia.  No  organomegaly.  Good bowel sounds heard.  EXTREMITIES:  Without edema.   IMPRESSION:  1. Incidental lesion of the right lobe of the liver incidentally found      on recent imaging studies.  Radiographic interpretation suggests      hemangioma, though some level of uncertainty as described above.      Additional imaging to clarify the issue seems reasonable,      particularly since no mention of the lesion was made on an      ultrasound 18 months previous.  2. Gastroesophageal reflux disease complicated by Barrett's esophagus.      Chronic problems with intermittent dysphagia likely due to subtle      esophageal stricture that was not appreciated on previous      endoscopy.  3. Adenomatous colon polyps.  Due for surveillance per previous      recommendation.  4. Antiplatelet therapy in the form of Plavix and aspirin due to prior      history of transient ischemic attack.  Patient reports having gone      off Plavix temporarily in the past for procedural work, and prefers      to do the same if therapy is planned.  5. Multiple other general medical problems.   RECOMMENDATIONS:  1. Upper endoscopy with esophageal dilation.  The nature of the     procedure, as well as the risks, benefits, and alternatives  have      been reviewed.  She understood and agreed to proceed.  2. Schedule surveillance colonoscopy with polypectomy if necessary.      The nature of the procedure as well as the risks, benefits, and      alternatives have been reviewed.  She understood and agreed to      proceed.Procedures  will be planned together due anticoagulation      issues.  3. Continue Nexium for reflux. As well, reflux precautions.  4. Schedule triphasic CT scan of the liver to further elucidate right      hepatic lesion.  5. Ongoing general medical care with Dr. Marguerite Olea.     Wilhemina Bonito. Marina Goodell, MD  Electronically Signed    JNP/MedQ  DD: 11/13/2006  DT: 11/14/2006  Job #: [id number]   cc:   Wonda Cheng

## 2011-04-22 ENCOUNTER — Other Ambulatory Visit: Payer: Self-pay | Admitting: Family Medicine

## 2011-04-22 DIAGNOSIS — Z1231 Encounter for screening mammogram for malignant neoplasm of breast: Secondary | ICD-10-CM

## 2011-04-24 ENCOUNTER — Ambulatory Visit
Admission: RE | Admit: 2011-04-24 | Discharge: 2011-04-24 | Disposition: A | Discharge status: Home / Self Care | Payer: Managed Care, Other (non HMO) | Source: Ambulatory Visit | Attending: Family Medicine | Admitting: Family Medicine

## 2011-04-24 DIAGNOSIS — Z1231 Encounter for screening mammogram for malignant neoplasm of breast: Secondary | ICD-10-CM

## 2011-05-12 DIAGNOSIS — R413 Other amnesia: Secondary | ICD-10-CM

## 2011-06-18 LAB — URINALYSIS, ROUTINE W REFLEX MICROSCOPIC
Ketones, ur: NEGATIVE
Nitrite: NEGATIVE
Protein, ur: NEGATIVE
Urobilinogen, UA: 0.2
pH: 6.5

## 2011-06-18 LAB — CBC
MCV: 88.7
Platelets: 336
WBC: 16.9 — ABNORMAL HIGH

## 2011-07-03 ENCOUNTER — Other Ambulatory Visit: Payer: Self-pay | Admitting: Neurology

## 2011-07-03 DIAGNOSIS — R200 Anesthesia of skin: Secondary | ICD-10-CM

## 2011-07-03 DIAGNOSIS — R9389 Abnormal findings on diagnostic imaging of other specified body structures: Secondary | ICD-10-CM

## 2011-07-03 DIAGNOSIS — M791 Myalgia, unspecified site: Secondary | ICD-10-CM

## 2011-07-19 ENCOUNTER — Emergency Department: Payer: Self-pay | Admitting: Emergency Medicine

## 2011-08-10 ENCOUNTER — Other Ambulatory Visit: Payer: Self-pay | Admitting: Internal Medicine

## 2011-08-11 ENCOUNTER — Other Ambulatory Visit: Payer: Self-pay

## 2011-08-11 MED ORDER — ESOMEPRAZOLE MAGNESIUM 40 MG PO CPDR: 40.0000 mg | DELAYED_RELEASE_CAPSULE | Freq: Every day | ORAL | Status: DC

## 2011-08-11 NOTE — Telephone Encounter (Signed)
Refill of nexium

## 2011-08-19 ENCOUNTER — Telehealth: Payer: Self-pay | Admitting: Internal Medicine

## 2011-08-19 NOTE — Telephone Encounter (Signed)
Pt states that when she is eating she is choking easy. Reports she is having difficulty swallowing and that her throat burns at times. Pt scheduled to see Dr. Marina Goodell 08/22/11@2 :30pm. Pt aware of appt date and time.

## 2011-08-22 ENCOUNTER — Ambulatory Visit: Payer: Managed Care, Other (non HMO) | Admitting: Internal Medicine

## 2011-10-14 ENCOUNTER — Encounter: Payer: Self-pay | Admitting: Internal Medicine

## 2011-10-28 ENCOUNTER — Encounter: Payer: Self-pay | Admitting: Internal Medicine

## 2011-10-28 ENCOUNTER — Ambulatory Visit (INDEPENDENT_AMBULATORY_CARE_PROVIDER_SITE_OTHER): Payer: BC Managed Care – PPO | Admitting: Internal Medicine

## 2011-10-28 VITALS — BP 114/76 | HR 76 | Ht 68.0 in | Wt 234.6 lb

## 2011-10-28 DIAGNOSIS — I679 Cerebrovascular disease, unspecified: Secondary | ICD-10-CM

## 2011-10-28 DIAGNOSIS — R1310 Dysphagia, unspecified: Secondary | ICD-10-CM

## 2011-10-28 DIAGNOSIS — K219 Gastro-esophageal reflux disease without esophagitis: Secondary | ICD-10-CM

## 2011-10-28 DIAGNOSIS — Z86010 Personal history of colonic polyps: Secondary | ICD-10-CM

## 2011-10-28 DIAGNOSIS — K2270 Barrett's esophagus without dysplasia: Secondary | ICD-10-CM

## 2011-10-28 MED ORDER — PEG-KCL-NACL-NASULF-NA ASC-C 100 G PO SOLR: 1.0000 | Freq: Once | ORAL | Status: DC

## 2011-10-28 NOTE — Patient Instructions (Signed)
You have been scheduled for an endoscopy with dilation and colonoscopy with propofol. Please follow the written instructions given to you at your visit today. Please pick up your prep at the pharmacy within the next 1-3 days.  Please hold your plavix for 5 days prior to the procedure  In the mean time you may take one baby aspirin a day  We are increasing your nexium to twice a day; You may start with the samples we are supplying, let us know if you need a new prescription

## 2011-10-28 NOTE — Progress Notes (Signed)
HISTORY OF PRESENT ILLNESS:  Robin Bryan is a 61 y.o. female with multiple medical problems including hypertension, hyperlipidemia, obesity, cerebrovascular disease with prior TIA, degenerative joint disease, anxiety disorder, GERD complicated by Barrett esophagus and peptic stricture requiring dilation, and adenomatous colon polyps. She presents today regarding several issues. First, she received a recall letter for surveillance colonoscopy. She has a history of adenomatous colon polyps on her index colonoscopy in 2005. Adenomatous colon polyp along with diverticulosis on followup April 2008. Due for surveillance at this time. Next, she reports breakthrough reflux symptoms despite once daily Nexium. She describes pyrosis as well as regurgitation, particularly at night. She has had recurrent dysphagia since her last esophageal dilation in August of 2011 (54 [place] Maloney dilation). She did well with her swallowing until recent months. She did have biopsies at that time, for Barrett's esophagus. Routine followup in 3 years. Her GI review of systems is otherwise negative. She states that her medical health has been stable since her last endoscopy. No new medical problems, hospitalizations, or deterioration of chronic medical conditions.Marland Kitchen  REVIEW OF SYSTEMS:  All non-GI ROS reported to be entirely negative  Past Medical History  Diagnosis Date  . GERD (gastroesophageal reflux disease)   . Esophageal stricture   . Barrett's esophagus   . Hx of adenomatous colonic polyps   . Diverticulosis   . Anxiety   . Hyperlipidemia   . Hypertension   . Stroke     Past Surgical History  Procedure Date  . Abdominal hysterectomy   . Tubal ligation   . Incontinence surgery     Social History Robin Bryan  reports that she has quit smoking. She has never used smokeless tobacco. She reports that she does not drink alcohol or use illicit drugs.  family history includes Colon polyps in her mother; Diabetes in  her mother and sister; and Ovarian cancer in her sister.  No Known Allergies     PHYSICAL EXAMINATION: Vital signs: BP 114/76  Pulse 76  Ht 5\' 8"  (1.727 m)  Wt 234 lb 9.6 oz (106.414 kg)  BMI 35.67 kg/m2  Constitutional: obese,generally well-appearing, no acute distress Psychiatric: pleasant,alert and oriented x3, cooperative Eyes: extraocular movements intact, anicteric, conjunctiva pink Mouth: oral pharynx moist, no lesions Neck: supple no lymphadenopathy Cardiovascular: heart regular rate and rhythm, no murmur Lungs: clear to auscultation bilaterally Abdomen: soft, nontender, nondistended, no obvious ascites, no peritoneal signs, normal bowel sounds, no organomegaly Rectal:deferred until colonoscopy Extremities: no lower extremity edema bilaterally Skin: no lesions on visible extremities Neuro: No focal deficits.   ASSESSMENT:  #1. Personal history of adenomatous colon polyps. Due for surveillance colonoscopy. High-Risk given the need to address Plavix therapy for prior history of TIA. #2. GERD. Complicated by peptic stricture and Barrett's esophagus. Breakthrough symptoms despite once daily PPI #3. Recurrent dysphagia secondary to peptic stricture #4. Barrett's esophagus. Last surveillance exam 2011 #5. Multiple general medical problems. Stable   PLAN:  #1. Colonoscopy.The nature of the procedure, as well as the risks, benefits, and alternatives were carefully and thoroughly reviewed with the patient. Ample time for discussion and questions allowed. The patient understood, was satisfied, and agreed to proceed.  #2. Upper endoscopy with surveillance biopsies and esophageal dilation.The nature of the procedure, as well as the risks, benefits, and alternatives were carefully and thoroughly reviewed with the patient. Ample time for discussion and questions allowed. The patient understood, was satisfied, and agreed to proceed.  #3. Discussed pros and cons of holding Plavix  therapy.  Given the therapeutic nature of the exam, we will hold Plavix 5 days prior to the procedure parentheses as we have done previously) and initiate daily baby aspirin as overlap therapy, to be started several days prior to discontinuing Plavix and continue for several days after resuming Plavix post procedure #4. Reflux precautions #5. Increase Nexium to 40 mg twice a day. Multiple samples have been given for her to try until her procedures. At that time, if increased dosage of PPI helpful, we will prescribe such.

## 2011-11-25 ENCOUNTER — Ambulatory Visit (AMBULATORY_SURGERY_CENTER): Payer: BC Managed Care – PPO | Admitting: Internal Medicine

## 2011-11-25 ENCOUNTER — Encounter: Payer: Self-pay | Admitting: Internal Medicine

## 2011-11-25 VITALS — BP 125/75 | HR 62 | Temp 96.8°F | Resp 20 | Ht 68.0 in | Wt 234.0 lb

## 2011-11-25 DIAGNOSIS — D126 Benign neoplasm of colon, unspecified: Secondary | ICD-10-CM

## 2011-11-25 DIAGNOSIS — K2270 Barrett's esophagus without dysplasia: Secondary | ICD-10-CM

## 2011-11-25 DIAGNOSIS — Z1211 Encounter for screening for malignant neoplasm of colon: Secondary | ICD-10-CM

## 2011-11-25 DIAGNOSIS — R1310 Dysphagia, unspecified: Secondary | ICD-10-CM

## 2011-11-25 DIAGNOSIS — K222 Esophageal obstruction: Secondary | ICD-10-CM

## 2011-11-25 DIAGNOSIS — K219 Gastro-esophageal reflux disease without esophagitis: Secondary | ICD-10-CM

## 2011-11-25 DIAGNOSIS — Z86010 Personal history of colonic polyps: Secondary | ICD-10-CM

## 2011-11-25 MED ORDER — SODIUM CHLORIDE 0.9 % IV SOLN: 500.0000 mL | INTRAVENOUS | Status: DC

## 2011-11-25 NOTE — Op Note (Signed)
[facility] [address] [place], [place]  [address]  ENDOSCOPY PROCEDURE REPORT  PATIENT:  Diala, Waxman  MR#:  [id number] BIRTHDATE:  [birth year], 61 yrs. old  GENDER:  female  ENDOSCOPIST:  Wilhemina Bonito. Eda Keys, MD Referred by:  Office  PROCEDURE DATE:  11/25/2011 PROCEDURE:  EGD with biopsy, 43239, Maloney Dilation of Esophagus - 21F ASA CLASS:  Class III INDICATIONS:  dilation of esophageal stricture, dysphagia, h/o Barrett's Esophagus, GERD ; last exam 04-2010 (dilated 54 F)  MEDICATIONS:   MAC sedation, administered by CRNA, propofol (Diprivan) 180 mg IV TOPICAL ANESTHETIC:  none  DESCRIPTION OF PROCEDURE:   After the risks benefits and alternatives of the procedure were thoroughly explained, informed consent was obtained.  The LB GIF-H180 [id number] endoscope was introduced through the mouth and advanced to the second portion of the duodenum, without limitations.  The instrument was slowly withdrawn as the mucosa was fully examined. <<PROCEDUREIMAGES>>  1.5cm Barrett's esophagus was found in the distal esophagus. MULTIPLE BX TAKEN.A benign large caliber stricture was found as well.  Otherwise the examination was normal.    Retroflexed views revealed a hiatal hernia.    The scope was then withdrawn from the patient and the procedure completed.  THERAPY: 21F MALONEY DILATOR PASSED W/O RESISTANCE OR HEME. TOLERATED WELL.  COMPLICATIONS:  None  ENDOSCOPIC IMPRESSION: 1) Barrett's esophagus in the distal esophagus - S/P BX 2) Stricture - S/P DILATION 3) Otherwise normal examination 4) A hiatal hernia  RECOMMENDATIONS: 1) Clear liquids until 5PM, then soft foods rest of day. Resume prior diet tomorrow. 2) Continue NEXIUM 3) REPEAT SURVEILLANCE EGD IN 3 YEARS IF NO DYSPLASIA ON BX  ______________________________ Wilhemina Bonito. Eda Keys, MD  CC:  Wonda Cheng, MD; e Patient  n. eSIGNED:   Wilhemina Bonito. Eda Keys at 11/25/2011 03:54 PM  Armandina Stammer, [phone]

## 2011-11-25 NOTE — Op Note (Signed)
[facility] [address] [place], [place]  [address]  COLONOSCOPY PROCEDURE REPORT  PATIENT:  Robin Bryan, Robin Bryan  MR#:  [id number] BIRTHDATE:  1951-08-30, 61 yrs. old  GENDER:  female ENDOSCOPIST:  Wilhemina Bonito. Eda Keys, MD REF. BY:  Office PROCEDURE DATE:  11/25/2011 PROCEDURE:  Colonoscopy with snare polypectomyx 2 ASA CLASS:  Class III INDICATIONS:  history of pre-cancerous (adenomatous) colon polyps, surveillance and high-risk screening ; index 2005 and f/u 2008 w/ TAs MEDICATIONS:   MAC sedation, administered by CRNA, propofol (Diprivan) 200 mg IV  DESCRIPTION OF PROCEDURE:   After the risks benefits and alternatives of the procedure were thoroughly explained, informed consent was obtained.  Digital rectal exam was performed and revealed no abnormalities.   The LB 180AL [id number] endoscope was introduced through the anus and advanced to the cecum, which was identified by both the appendix and ileocecal valve, without limitations.  The quality of the prep was excellent, using MoviPrep.  The instrument was then slowly withdrawn as the colon was fully examined. <<PROCEDUREIMAGES>>  FINDINGS:  A diminutive polyp was found ascending colon and sigmoid colon. Polyps were snared without cautery. Retrieval was successful. Moderate diverticulosis was found in the sigmoid colon.  Otherwise normal colonoscopy without other polyps, masses, vascular ectasias, or inflammatory changes.   Retroflexed views in the rectum revealed no abnormalities.    The time to cecum = 3:06 minutes. The scope was then withdrawn in 9:30  minutes from the cecum and the procedure completed.  COMPLICATIONS:  None  ENDOSCOPIC IMPRESSION: 1) Diminutive polyp ascending colon and sigmoid colon - removed  2) Moderate diverticulosis in the sigmoid colon 3) Otherwise normal colonoscopy  RECOMMENDATIONS: 1) Follow up colonoscopy in 5 years  ______________________________ Wilhemina Bonito. Eda Keys, MD  CC:  Wonda Cheng, MD;  The Patient  n. eSIGNED:   Wilhemina Bonito. Eda Keys at 11/25/2011 03:42 PM  Armandina Stammer, [phone]

## 2011-11-25 NOTE — Progress Notes (Signed)
Patient did not experience any of the following events: a burn prior to discharge; a fall within the facility; wrong site/side/patient/procedure/implant event; or a hospital transfer or hospital admission upon discharge from the facility. (G8907) Patient did not have preoperative order for IV antibiotic SSI prophylaxis. (G8918)  

## 2011-11-25 NOTE — Patient Instructions (Signed)
YOU HAD AN ENDOSCOPIC PROCEDURE TODAY AT THE [facility]: Refer to the procedure report that was given to you for any specific questions about what was found during the examination.  If the procedure report does not answer your questions, please call your gastroenterologist to clarify.  If you requested that your care partner not be given the details of your procedure findings, then the procedure report has been included in a sealed envelope for you to review at your convenience later.  YOU SHOULD EXPECT: Some feelings of bloating in the abdomen. Passage of more gas than usual.  Walking can help get rid of the air that was put into your GI tract during the procedure and reduce the bloating. If you had a lower endoscopy (such as a colonoscopy or flexible sigmoidoscopy) you may notice spotting of blood in your stool or on the toilet paper. If you underwent a bowel prep for your procedure, then you may not have a normal bowel movement for a few days.  DIET: FOLLOW DILATATION DIET GIVEN TO YOU ,CLEAR LIQUIDS UNTIL 5:00PM , THEN SOFT FOODS ONLY THE REST OF TODAY. RESUME REGULAR DIET TOMORROW. .  Drink plenty of fluids but you should avoid alcoholic beverages for 24 hours.   ACTIVITY: Your care partner should take you home directly after the procedure.  You should plan to take it easy, moving slowly for the rest of the day.  You can resume normal activity the day after the procedure however you should NOT DRIVE or use heavy machinery for 24 hours (because of the sedation medicines used during the test).    SYMPTOMS TO REPORT IMMEDIATELY: A gastroenterologist can be reached at any hour.  During normal business hours, 8:30 AM to 5:00 PM Monday through Friday, call [phone].  After hours and on weekends, please call the GI answering service at [phone] who will take a message and have the physician on call contact you.   Following lower endoscopy (colonoscopy or flexible  sigmoidoscopy):  Excessive amounts of blood in the stool  Significant tenderness or worsening of abdominal pains  Swelling of the abdomen that is new, acute  Fever of 100F or higher  Following upper endoscopy (EGD)  Vomiting of blood or coffee ground material  New chest pain or pain under the shoulder blades  Painful or persistently difficult swallowing  New shortness of breath  Fever of 100F or higher  Black, tarry-looking stools  FOLLOW UP: If any biopsies were taken you will be contacted by phone or by letter within the next 1-3 weeks.  Call your gastroenterologist if you have not heard about the biopsies in 3 weeks.  Our staff will call the home number listed on your records the next business day following your procedure to check on you and address any questions or concerns that you may have at that time regarding the information given to you following your procedure. This is a courtesy call and so if there is no answer at the home number and we have not heard from you through the emergency physician on call, we will assume that you have returned to your regular daily activities without incident.  SIGNATURES/CONFIDENTIALITY: You and/or your care partner have signed paperwork which will be entered into your electronic medical record.  These signatures attest to the fact that that the information above on your After Visit Summary has been reviewed and is understood.  Full responsibility of the confidentiality of this discharge information lies with you and/or  your care-partner.    OK TO RESUME YOUR PLAVIX TODAY.    INFORMATION ON POLYPS,DIVERTICULOSIS, HIGH FIBER DIET , BARRETTS ESOPHAGUS, HIATAL HERNIA, ESOPHAGEAL STRICTURE, AND DILATATION DIET TO FOLLOW TODAY.

## 2011-11-26 ENCOUNTER — Telehealth: Payer: Self-pay | Admitting: *Deleted

## 2011-11-26 NOTE — Telephone Encounter (Signed)
Left message on number given yesterday in admitting as directed by patient. ewm

## 2011-12-08 ENCOUNTER — Encounter: Payer: Self-pay | Admitting: Internal Medicine

## 2012-03-19 ENCOUNTER — Other Ambulatory Visit: Payer: Self-pay | Admitting: Obstetrics and Gynecology

## 2012-03-19 DIAGNOSIS — Z1231 Encounter for screening mammogram for malignant neoplasm of breast: Secondary | ICD-10-CM

## 2012-04-27 ENCOUNTER — Ambulatory Visit: Payer: BC Managed Care – PPO

## 2012-05-10 ENCOUNTER — Ambulatory Visit
Admission: RE | Admit: 2012-05-10 | Discharge: 2012-05-10 | Disposition: A | Discharge status: Home / Self Care | Payer: BC Managed Care – PPO | Source: Ambulatory Visit | Attending: Obstetrics and Gynecology | Admitting: Obstetrics and Gynecology

## 2012-05-10 DIAGNOSIS — Z1231 Encounter for screening mammogram for malignant neoplasm of breast: Secondary | ICD-10-CM

## 2012-05-17 ENCOUNTER — Ambulatory Visit: Payer: BC Managed Care – PPO

## 2012-05-25 LAB — BASIC METABOLIC PANEL
Anion Gap: 11 (ref 7–16)
Chloride: 105 mmol/L (ref 98–107)
Co2: 24 mmol/L (ref 21–32)
Creatinine: 0.46 mg/dL — ABNORMAL LOW (ref 0.60–1.30)
Potassium: 4.1 mmol/L (ref 3.5–5.1)

## 2012-05-25 LAB — CBC
HCT: 38.9 % (ref 35.0–47.0)
MCH: 32.5 pg (ref 26.0–34.0)
MCHC: 35.8 g/dL (ref 32.0–36.0)
MCV: 91 fL (ref 80–100)
RDW: 13 % (ref 11.5–14.5)

## 2012-05-25 LAB — URINALYSIS, COMPLETE
Bacteria: NONE SEEN
Bilirubin,UR: NEGATIVE
Blood: NEGATIVE
Glucose,UR: NEGATIVE mg/dL (ref 0–75)
Leukocyte Esterase: NEGATIVE
Nitrite: NEGATIVE
Ph: 8 (ref 4.5–8.0)
RBC,UR: NONE SEEN /HPF (ref 0–5)
Squamous Epithelial: <1
WBC UR: 1 /HPF (ref 0–5)

## 2012-05-26 ENCOUNTER — Inpatient Hospital Stay: Payer: Self-pay | Admitting: Internal Medicine

## 2012-08-06 ENCOUNTER — Ambulatory Visit
Admission: RE | Admit: 2012-08-06 | Discharge: 2012-08-06 | Disposition: A | Discharge status: Home / Self Care | Payer: PRIVATE HEALTH INSURANCE | Source: Ambulatory Visit | Attending: Neurology | Admitting: Neurology

## 2012-08-06 ENCOUNTER — Other Ambulatory Visit: Payer: Self-pay | Admitting: Neurology

## 2012-08-06 DIAGNOSIS — M25519 Pain in unspecified shoulder: Secondary | ICD-10-CM

## 2012-08-11 ENCOUNTER — Other Ambulatory Visit: Payer: Self-pay | Admitting: Neurology

## 2012-08-11 DIAGNOSIS — R9089 Other abnormal findings on diagnostic imaging of central nervous system: Secondary | ICD-10-CM

## 2012-08-11 DIAGNOSIS — R200 Anesthesia of skin: Secondary | ICD-10-CM

## 2012-08-17 ENCOUNTER — Ambulatory Visit
Admission: RE | Admit: 2012-08-17 | Discharge: 2012-08-17 | Disposition: A | Discharge status: Home / Self Care | Payer: PRIVATE HEALTH INSURANCE | Source: Ambulatory Visit | Attending: Neurology | Admitting: Neurology

## 2012-08-17 ENCOUNTER — Other Ambulatory Visit: Payer: PRIVATE HEALTH INSURANCE

## 2012-08-17 VITALS — BP 111/72 | HR 61

## 2012-08-17 DIAGNOSIS — R9089 Other abnormal findings on diagnostic imaging of central nervous system: Secondary | ICD-10-CM

## 2012-08-17 DIAGNOSIS — R200 Anesthesia of skin: Secondary | ICD-10-CM

## 2012-08-17 NOTE — Progress Notes (Signed)
Blood drawn to go with spinal fluid as ordered. #21 g needle to left AC to obtain blood, site unremarkable and pt tolerated procedure well. Discharge instructions explained.

## 2012-08-18 LAB — CSF PANEL 1
Glucose, CSF: 67 mg/dL (ref 43–76)
RBC Count, CSF: 0 cu mm
Total Protein, CSF: 47 mg/dL — ABNORMAL HIGH (ref 15–45)
Tube #: 4
WBC, CSF: 2 cu mm (ref 0–5)

## 2012-08-19 LAB — ANGIOTENSIN CONVERTING ENZYME, CSF: ACE, CSF: 2 U/L (ref <=15)

## 2012-08-20 LAB — CNS IGG SYNTHESIS RATE, CSF+BLOOD
IgG Index, CSF: 0.47 (ref <0.66)
MS CNS IgG Synthesis Rate: -2.4 mg/24 h (ref <=3.3)

## 2012-08-26 ENCOUNTER — Telehealth: Payer: Self-pay | Admitting: Internal Medicine

## 2012-08-26 MED ORDER — ESOMEPRAZOLE MAGNESIUM 40 MG PO CPDR: 40.0000 mg | DELAYED_RELEASE_CAPSULE | Freq: Two times a day (BID) | ORAL | Status: DC

## 2012-08-26 NOTE — Telephone Encounter (Signed)
Refilled Nexium to [facility] in [place]

## 2012-10-16 ENCOUNTER — Other Ambulatory Visit: Payer: Self-pay

## 2012-12-01 ENCOUNTER — Telehealth: Payer: Self-pay | Admitting: Nurse Practitioner

## 2013-03-23 NOTE — Telephone Encounter (Signed)
noted 

## 2013-04-11 ENCOUNTER — Other Ambulatory Visit: Payer: Self-pay

## 2013-04-11 DIAGNOSIS — Z1231 Encounter for screening mammogram for malignant neoplasm of breast: Secondary | ICD-10-CM

## 2013-05-12 ENCOUNTER — Ambulatory Visit
Admission: RE | Admit: 2013-05-12 | Discharge: 2013-05-12 | Disposition: A | Discharge status: Home / Self Care | Payer: BC Managed Care – PPO | Source: Ambulatory Visit

## 2013-05-12 DIAGNOSIS — Z1231 Encounter for screening mammogram for malignant neoplasm of breast: Secondary | ICD-10-CM

## 2013-05-18 ENCOUNTER — Ambulatory Visit: Payer: Self-pay | Admitting: Family Medicine

## 2013-05-26 ENCOUNTER — Telehealth: Payer: Self-pay | Admitting: Internal Medicine

## 2013-05-26 MED ORDER — ESOMEPRAZOLE MAGNESIUM 40 MG PO CPDR: 40.0000 mg | DELAYED_RELEASE_CAPSULE | Freq: Two times a day (BID) | ORAL | Status: DC

## 2013-05-26 NOTE — Telephone Encounter (Signed)
Refilled Nexium 

## 2013-07-07 ENCOUNTER — Other Ambulatory Visit: Payer: Self-pay

## 2013-08-16 ENCOUNTER — Encounter: Payer: Self-pay | Admitting: Neurology

## 2013-08-16 ENCOUNTER — Encounter (INDEPENDENT_AMBULATORY_CARE_PROVIDER_SITE_OTHER): Payer: Self-pay

## 2013-08-16 ENCOUNTER — Ambulatory Visit (INDEPENDENT_AMBULATORY_CARE_PROVIDER_SITE_OTHER): Payer: BC Managed Care – PPO | Admitting: Neurology

## 2013-08-16 VITALS — BP 131/84 | HR 79 | Temp 98.5°F | Ht 67.5 in | Wt 230.0 lb

## 2013-08-16 DIAGNOSIS — M25561 Pain in right knee: Secondary | ICD-10-CM

## 2013-08-16 DIAGNOSIS — M797 Fibromyalgia: Secondary | ICD-10-CM

## 2013-08-16 DIAGNOSIS — Z8673 Personal history of transient ischemic attack (TIA), and cerebral infarction without residual deficits: Secondary | ICD-10-CM

## 2013-08-16 DIAGNOSIS — R251 Tremor, unspecified: Secondary | ICD-10-CM

## 2013-08-16 DIAGNOSIS — IMO0001 Reserved for inherently not codable concepts without codable children: Secondary | ICD-10-CM

## 2013-08-16 DIAGNOSIS — M25519 Pain in unspecified shoulder: Secondary | ICD-10-CM

## 2013-08-16 DIAGNOSIS — R259 Unspecified abnormal involuntary movements: Secondary | ICD-10-CM

## 2013-08-16 DIAGNOSIS — M25569 Pain in unspecified knee: Secondary | ICD-10-CM

## 2013-08-16 NOTE — Patient Instructions (Signed)
Continue walking regularly, and your medications as directed. As discussed, secondary prevention is key after a TIA. This means: taking care of blood sugar values or diabetes management, good blood pressure (hypertension) control and optimizing cholesterol management, exercising daily or regularly within your own mobility limitations of course and overall cardiovascular risk factor reduction, which includes screening for and treatment of obstructive sleep apnea (OSA); you have had a sleep study. I will recommend a referral to rheumatology.

## 2013-08-16 NOTE — Progress Notes (Signed)
Subjective:    Patient ID: Robin Bryan is a 62 y.o. female.  HPI    Interim history:   Dear Dr. Marguerite Olea,   I saw your patient, Robin Bryan, upon your kind request in my neurologic clinic today for followup consultation of her history of TIA. The patient is unaccompanied today. As you know, Ms. Duell is a very pleasant 62 year old right-handed woman with an underlying medical history of hypertension, fibromyalgia, hyperlipidemia and a TIA in October 2013 who previously followed with Dr. Avie Echevaria and was last seen by him on 08/06/2012, at which time he felt that she had fibromyalgia. He tapered her sertraline and started her on Cymbalta with build up to 60 mg daily. He did blood work including ESR and suggested x-rays of her shoulder. He also requested a lumbar puncture to rule out multiple sclerosis. She had a lumbar puncture done on 08/17/2012 and I reviewed those test results. She had normal findings, with a borderline protein of 47 and negative oligoclonal bands. She had a normal IgG index. Her blood work showed normal rheumatoid factor and ESR of 3. She had SE with the Cymbalta and had to stop that and went back to Zoloft, which recently was increased from 50 to 100 mg daily. She has no recent weakness, numbness or tingling, but reports nocturnal leg cramps. She had a sleep study and has no OSA, she states. She c/o joint pain in both shoulders and both knees and had shots under Dr. Sheppard Penton. She has never seen a rheumatologist.  I reviewed Dr. Imagene Gurney prior note. She was first seen by him in August 2006 for an abnormal brain MRI and complaints of memory loss, dizziness, headaches, weakness, balance and speech problems. She had episodes of blurry vision and episodes of difficulty getting her words out. She does not have a history of recurrent headaches. She was having about 3-4 episodes per month. She apparently has had prior strokes. She has small vessel disease on her brain MRI. She has been on Plavix.  She had further workup of cardiovascular disease with a 2-D echocardiogram which was reportedly normal per Dr. Imagene Gurney note and MR a head. She has been on cholesterol medication, first with atorvastatin, now with Crestor. She had neuropsychological studies in 2011 and 2012 showing some inconsistent findings and no evidence of a decline in her memory. She had EMG and nerve conduction velocity testing in October 2012 which were normal with the exception of isolated denervation of the right lower lumbosacral paraspinal muscles. Brain MRI and C-spine MRI in October 2012 showed minimal chronic small vessel disease. She had moderate disc bulging at C5-6 and C6-7. She [facility] October last year with slurring of speech and slowness in her thinking. Today, she reports no new TIA like Sx. She mainly complaints of joint achiness.   Her Past Medical History Is Significant For: Past Medical History  Diagnosis Date  . GERD (gastroesophageal reflux disease)   . Esophageal stricture   . Barrett's esophagus   . Hx of adenomatous colonic polyps   . Diverticulosis   . Anxiety   . Hyperlipidemia   . Hypertension   . Stroke   . Bronchitis     Her Past Surgical History Is Significant For: Past Surgical History  Procedure Laterality Date  . Abdominal hysterectomy    . Tubal ligation    . Incontinence surgery      Her Family History Is Significant For: Family History  Problem Relation Age  of Onset  . Ovarian cancer Sister   . Colon polyps Mother   . Diabetes Mother   . Diabetes Sister   . Parkinson's disease Cousin   . Lung cancer Sister     Her Social History Is Significant For: History   Social History  . Marital Status: Married    Spouse Name: N/A    Number of Children: N/A  . Years of Education: N/A   Social History Main Topics  . Smoking status: Former Games developer  . Smokeless tobacco: Never Used  . Alcohol Use: No  . Drug Use: No  . Sexual Activity: None    Other Topics Concern  . None   Social History Narrative  . None    Her Allergies Are:  No Known Allergies:   Her Current Medications Are:  Outpatient Encounter Prescriptions as of 08/16/2013  Medication Sig  . clopidogrel (PLAVIX) 75 MG tablet Take 75 mg by mouth daily.  Marland Kitchen esomeprazole (NEXIUM) 40 MG capsule Take 1 capsule (40 mg total) by mouth 2 (two) times daily.  Marland Kitchen losartan (COZAAR) 25 MG tablet Take 25 mg by mouth daily.  . NON FORMULARY   . oxybutynin (DITROPAN) 5 MG tablet Take 5 mg by mouth daily.  . rosuvastatin (CRESTOR) 10 MG tablet Take 10 mg by mouth daily.  . sertraline (ZOLOFT) 100 MG tablet Take 100 mg by mouth daily.  . [DISCONTINUED] atorvastatin (LIPITOR) 10 MG tablet Take 10 mg by mouth daily.   Review of Systems:  Out of a complete 14 point review of systems, all are reviewed and negative with the exception of these symptoms as listed below:   Review of Systems  Eyes: Positive for visual disturbance (blurred vision).  Respiratory: Negative.   Cardiovascular: Negative.   Gastrointestinal: Negative.   Endocrine: Negative.   Genitourinary: Negative.   Musculoskeletal: Positive for arthralgias and myalgias.       Muscle cramps  Skin: Negative.   Allergic/Immunologic: Negative.   Neurological: Positive for tremors, weakness, numbness and headaches.  Hematological: Negative.   Psychiatric/Behavioral: Positive for dysphoric mood.    Objective:  Neurologic Exam  Physical Exam Physical Examination:   Filed Vitals:   08/16/13 1005  BP: 131/84  Pulse: 79  Temp: 98.5 F (36.9 C)    General Examination: The patient is a very pleasant 62 y.o. female in no acute distress. She appears well-developed and well-nourished and adequately groomed. She is obese.    HEENT: Normocephalic, atraumatic, pupils are equal, round and reactive to light and accommodation. Funduscopic exam is normal with sharp disc margins noted. Extraocular tracking is good without  limitation to gaze excursion or nystagmus noted. Normal smooth pursuit is noted. Hearing is grossly intact. Tympanic membranes are clear bilaterally. Face is symmetric with normal facial animation and normal facial sensation. Speech is clear with no dysarthria noted. There is no hypophonia. There is no lip, neck/head, jaw or voice tremor. Neck is supple with full range of passive and active motion. There are no carotid bruits on auscultation. Oropharynx exam reveals: moderate mouth dryness, adequate dental hygiene and mild airway crowding, due to larger tongue. Mallampati is class II. Tongue protrudes centrally and palate elevates symmetrically. Tonsils are 1+.   Chest: Clear to auscultation without wheezing, rhonchi or crackles noted.  Heart: S1+S2+0, regular and normal without murmurs, rubs or gallops noted.   Abdomen: Soft, non-tender and non-distended with normal bowel sounds appreciated on auscultation.  Extremities: There is no pitting edema in the distal lower extremities  bilaterally. Pedal pulses are intact.  Skin: Warm and dry without trophic changes noted. There are no varicose veins.  Musculoskeletal: exam reveals no obvious joint deformities, but reports pain in both knees and both shoulders and does indeed have decrease in range of motion in the shoulders.  Neurologically:  Mental status: The patient is awake, alert and oriented in all 4 spheres. Her memory, attention, language and knowledge are appropriate. There is no aphasia, agnosia, apraxia or anomia. Speech is clear with normal prosody and enunciation. Thought process is linear. Mood is congruent and affect is blunted.  Cranial nerves are as described above under HEENT exam. In addition, shoulder shrug is normal with equal shoulder height noted. Motor exam: Normal bulk, strength and tone is noted. There is no drift, resting tremor or rebound, she has a minimal postural tremor in both UEs. Romberg is negative. Reflexes are 2+  throughout. Toes are downgoing bilaterally. Fine motor skills are intact with normal finger taps, normal hand movements, normal rapid alternating patting, normal foot taps and normal foot agility.  Cerebellar testing shows no dysmetria or intention tremor on finger to nose testing. Heel to shin is unremarkable bilaterally. There is no truncal or gait ataxia.  Sensory exam is intact to light touch, pinprick, vibration, temperature sense in the upper and lower extremities.  Gait, station and balance are unremarkable. No veering to one side is noted. No leaning to one side is noted. Posture is age-appropriate and stance is narrow based. No problems turning are noted. She turns en bloc. Tandem walk is unremarkable. Intact toe and heel stance is noted.               Assessment and Plan:   In summary, Larysa Pall is a very pleasant 62 y.o.-year old female with a history of FMS, depression, degenerative arthritis of shoulders and knees, hypertension, hyperlipidemia and history of TIA, who presents for FU consultation of her history of TIA . Her physical exam is stable and non-focal with the exception of a subtle postural tremor in both upper extremities. She reports an improvement in her tremor after she increased her Zoloft from 50-100 mg. Her tremor may be an enhanced physiologic or anxiety driven tremor. I did inform her that sometimes SSRI-type medications can indeed exacerbate tremors and she is just advised to look out for a change in her tremor, but so far she has had improvement in her tremor. She does not report a family history of tremor. As far as her TIA history is concerned she has been stable. She has not had any recurrence of symptoms. She is advised about secondary stroke and TIA prevention at this time. She had appropriate workup in the past and is currently on Plavix and Crestor and her blood pressure is under good control. She is advised to lose weight. She has previously had a sleep study and was  told she does not have sleep apnea she states. She is advised to continue with the current medication regimen. As far as her fibromyalgia symptoms, and joint pain is concerned. She has been following with orthopedics for her degenerative joint disease but she is advised to discuss with you a referral to rheumatology for further help with management of her fibromyalgia symptoms. She is in agreement. At this juncture, I can see her back on an as-needed basis. She is stable on her current medication regimen and I have suggested that she continue her prescriptions through you.  Most of my 30 minute visit today  was spent in counseling and coordination of care, reviewing test results and reviewing medication.  Thank you very much for allowing me to participate in the care of this nice patient. If I can be of any further assistance to you please do not hesitate to call me at [phone].  Sincerely,   Huston Foley, MD, PhD

## 2013-11-10 ENCOUNTER — Ambulatory Visit: Payer: Self-pay | Admitting: Family Medicine

## 2013-11-24 ENCOUNTER — Telehealth: Payer: Self-pay | Admitting: Neurology

## 2013-11-24 DIAGNOSIS — M797 Fibromyalgia: Secondary | ICD-10-CM

## 2013-11-24 NOTE — Telephone Encounter (Signed)
Referral to rheumatology placed for management of fibromyalgia.

## 2013-11-24 NOTE — Telephone Encounter (Signed)
Patient called to state that she was told by her PCP that our office is responsible to help find her a doctor who can treat her fibromyalgia. Please call and advise patient.

## 2013-11-24 NOTE — Telephone Encounter (Signed)
Pt calling stating that her PCP told her that out office is responsible for finding her a physician that would treat her for her fibromyalgia. Please advise

## 2013-11-29 ENCOUNTER — Telehealth: Payer: Self-pay | Admitting: Neurology

## 2013-11-29 NOTE — Telephone Encounter (Signed)
Amy from [facility] calling to state that the referral for patient that we sent over to their office has been declined. If questions, please call.

## 2013-12-07 NOTE — Telephone Encounter (Signed)
I contacted [facility] [phone]) and spoke with "Abigail Butts" regarding Ms. Grunow denied referral.  [facility] does not treat fibromyalgia.  I relayed this information to Ms. Koenigsberg and explained that Dr Rexene Alberts is out of the office this week and that we would attempt another referral for her next week.  I asked the pt. to call us if she has not heard from a referral by Thursday (12-15-13).  Ms. Jewell indicated that she understood and agreed to the plan.

## 2013-12-20 ENCOUNTER — Other Ambulatory Visit: Payer: Self-pay | Admitting: Internal Medicine

## 2013-12-20 ENCOUNTER — Telehealth: Payer: Self-pay

## 2013-12-20 MED ORDER — ESOMEPRAZOLE MAGNESIUM 40 MG PO CPDR: 40.0000 mg | DELAYED_RELEASE_CAPSULE | Freq: Two times a day (BID) | ORAL | Status: DC

## 2013-12-20 NOTE — Telephone Encounter (Signed)
Refilled Nexium 

## 2013-12-27 ENCOUNTER — Other Ambulatory Visit: Payer: Self-pay | Admitting: Neurology

## 2013-12-27 DIAGNOSIS — M797 Fibromyalgia: Secondary | ICD-10-CM

## 2013-12-27 DIAGNOSIS — IMO0001 Reserved for inherently not codable concepts without codable children: Secondary | ICD-10-CM

## 2014-03-31 ENCOUNTER — Telehealth: Payer: Self-pay | Admitting: Internal Medicine

## 2014-04-03 MED ORDER — ESOMEPRAZOLE MAGNESIUM 40 MG PO CPDR: 40.0000 mg | DELAYED_RELEASE_CAPSULE | Freq: Two times a day (BID) | ORAL | Status: DC

## 2014-04-03 NOTE — Telephone Encounter (Signed)
Refilled nexium 

## 2014-05-17 ENCOUNTER — Ambulatory Visit (INDEPENDENT_AMBULATORY_CARE_PROVIDER_SITE_OTHER)
Admission: RE | Admit: 2014-05-17 | Discharge: 2014-05-17 | Disposition: A | Discharge status: Home / Self Care | Payer: BC Managed Care – PPO | Source: Ambulatory Visit | Attending: Internal Medicine | Admitting: Internal Medicine

## 2014-05-17 ENCOUNTER — Encounter: Payer: Self-pay | Admitting: Internal Medicine

## 2014-05-17 ENCOUNTER — Ambulatory Visit (INDEPENDENT_AMBULATORY_CARE_PROVIDER_SITE_OTHER): Payer: BC Managed Care – PPO | Admitting: Internal Medicine

## 2014-05-17 VITALS — BP 126/86 | HR 84 | Ht 67.25 in | Wt 244.4 lb

## 2014-05-17 DIAGNOSIS — R109 Unspecified abdominal pain: Secondary | ICD-10-CM

## 2014-05-17 DIAGNOSIS — K219 Gastro-esophageal reflux disease without esophagitis: Secondary | ICD-10-CM

## 2014-05-17 DIAGNOSIS — K2270 Barrett's esophagus without dysplasia: Secondary | ICD-10-CM

## 2014-05-17 DIAGNOSIS — Z86010 Personal history of colonic polyps: Secondary | ICD-10-CM

## 2014-05-17 DIAGNOSIS — R1030 Lower abdominal pain, unspecified: Secondary | ICD-10-CM

## 2014-05-17 DIAGNOSIS — R159 Full incontinence of feces: Secondary | ICD-10-CM

## 2014-05-17 DIAGNOSIS — R1310 Dysphagia, unspecified: Secondary | ICD-10-CM

## 2014-05-17 MED ORDER — ESOMEPRAZOLE MAGNESIUM 40 MG PO CPDR: 40.0000 mg | DELAYED_RELEASE_CAPSULE | Freq: Two times a day (BID) | ORAL | Status: DC

## 2014-05-17 MED ORDER — IOHEXOL 300 MG/ML  SOLN
80.0000 mL | Freq: Once | INTRAMUSCULAR | Status: AC | PRN
  Administered 2014-05-17: 80 mL via INTRAVENOUS

## 2014-05-17 NOTE — Progress Notes (Signed)
HISTORY OF PRESENT ILLNESS:  Robin Bryan is a 63 y.o. female  with history of hypertension, hyperlipidemia, prior stroke, adenomatous colon polyps, and GERD complicated by Barrett esophagus and peptic stricture requiring esophageal dilation. The patient is referred today with multiple issues (to be described). She was last evaluated in the office 10/28/2011 at that time, she was due for surveillance colonoscopy and upper endoscopy. As well having issues with dysphagia. Colonoscopy and upper endoscopy were performed 11/25/2011. Colonoscopy revealed diminutive colon polyps and a moderate sigmoid diverticulosis. One polyp was an adenoma. Followup in 5 years recommended. Upper endoscopy revealed short segment Barrett's esophagus without dysplasia. Esophageal dilation was performed. She has continued on PPI. Currently requiring Nexium 40 mg twice daily to control reflux symptoms. She reports good control of reflux symptoms on medication. She does request medication refill. She also reports intermittent solid food dysphagia over the past 6 months. Overall this is minor, and she does not feel that dilation is needed at this time. She has had no weight loss. Actually, weight gain. Next, she reports a 3 month history of lower abdominal discomfort. Severe at times. Often exacerbated by certain movements. She is status post hysterectomy. She tells me that she was evaluated by her gynecologist, Dr. Ouida Bryan, who, am told, reported no abnormalities to explain the pain. Patient denies urologic complaints. No fevers. Finally, she mentions problems with fecal incontinence. She is not aware of the need to defecate. She does wear protective undergarments. Since her last visit, interval imaging and laboratory data of relevance reviewed. She inquires about disability (told to return back to her PCP regarding this issue)  REVIEW OF SYSTEMS:  All non-GI ROS negative except for anxiety, back pain, visual change, confusion, cough,  depression, fatigue, headaches, muscle cramps, night sweats, shortness of breath, sore throat  Past Medical History  Diagnosis Date  . GERD (gastroesophageal reflux disease)   . Esophageal stricture   . Barrett's esophagus   . Hx of adenomatous colonic polyps   . Diverticulosis   . Anxiety   . Hyperlipidemia   . Hypertension   . Stroke   . Bronchitis     Past Surgical History  Procedure Laterality Date  . Abdominal hysterectomy    . Tubal ligation    . Incontinence surgery      Social History Robin Bryan  reports that she has quit smoking. She has never used smokeless tobacco. She reports that she does not drink alcohol or use illicit drugs.  family history includes Colon polyps in her mother; Diabetes in her mother and sister; Heart disease in her sister; Lung cancer in her sister; Ovarian cancer in her sister; Parkinson's disease in her cousin.  No Known Allergies     PHYSICAL EXAMINATION: Vital signs: BP 126/86  Pulse 84  Ht 5' 7.25" (1.708 m)  Wt 244 lb 6 oz (110.848 kg)  BMI 38.00 kg/m2 General: Well-developed, well-nourished, no acute distress HEENT: Sclerae are anicteric, conjunctiva pink. Oral mucosa intact Lungs: Clear Heart: Regular Abdomen: soft, nontender, nondistended, no obvious ascites, no peritoneal signs, normal bowel sounds. No organomegaly. Extremities: No edema Psychiatric: alert and oriented x3. Cooperative Neurologic: No focal deficits   ASSESSMENT:  #1. GERD with a history of short segment Barrett's esophagus (nondysplastic). Last EGD with biopsies March 2013 #2. History of peptic stricture. Previous esophageal dilation with resolution of dysphagia. Now with minor recurrent dysphagia which the patient does not feel warrent repeat dilation currently #3. History of adenomatous colon polyps. Last surveillance  colonoscopy March 2013 #4. 3 month history of lower abdominal discomfort. Etiology uncertain. Question diverticulitis (possibly now  resolved with no pain at this particular time) #5. Fecal incontinence without awareness. May be neurologic related to prior CVA. #6. Multiple medical problems. On Plavix   PLAN:  #1. Reflux precautions with attention to weight loss #2. Refill Nexium 40 mg twice a day #3. Surveillance upper endoscopy with biopsies around March 2016. #4. Esophageal dilation as needed. Patient will contact the office if dysphagia worsens #5. Contrast-enhanced CT scan of the abdomen and pelvis to evaluate new problems with significant lower abdominal pain #6. Protective undergarments for incontinence #7. Surveillance colonoscopy around March 2018 #8. GI followup one year #9. Ongoing general medical care with PCP and other specialists

## 2014-05-17 NOTE — Patient Instructions (Signed)
You have been scheduled for a CT scan of the abdomen and pelvis at [facility] ([address]---this is in the same building as [facility]).   You are scheduled on 05/17/2014 at 1:15. You should arrive 15 minutes prior to your appointment time for registration. Please follow the written instructions below on the day of your exam:  WARNING: IF YOU ARE ALLERGIC TO IODINE/X-RAY DYE, PLEASE NOTIFY RADIOLOGY IMMEDIATELY AT [phone]! YOU WILL BE GIVEN A 13 HOUR PREMEDICATION PREP.  1) Do not drink anything starting now.(4 hours prior to your test) 2) You have been given 2 bottles of oral contrast to drink. The solution may taste  better if refrigerated, but do NOT add ice or any other liquid to this solution. Shake well before drinking.    Drink 1 bottle of contrast @ 11:15am (2 hours prior to your exam)  Drink 1 bottle of contrast @ 12:15am (1 hour prior to your exam)  You may take any medications as prescribed with a small amount of water except for the following: Metformin, Glucophage, Glucovance, Avandamet, Riomet, Fortamet, Actoplus Met, Janumet, Glumetza or Metaglip. The above medications must be held the day of the exam AND 48 hours after the exam.  The purpose of you drinking the oral contrast is to aid in the visualization of your intestinal tract. The contrast solution may cause some diarrhea. Before your exam is started, you will be given a small amount of fluid to drink. Depending on your individual set of symptoms, you may also receive an intravenous injection of x-ray contrast/dye. Plan on being at [facility] for 30 minutes or long, depending on the type of exam you are having performed.   We have sent the following medications to your pharmacy for you to pick up at your convenience: Nexium  Please follow up with Dr. Henrene Pastor in one year  If you have any questions regarding your exam or if you need to reschedule, you may call the CT department at  [phone] between the hours of 8:00 am and 5:00 pm, Monday-Friday.  ________________________________________________________________________

## 2014-05-26 ENCOUNTER — Ambulatory Visit: Payer: BC Managed Care – PPO | Attending: Gynecologic Oncology | Admitting: Gynecologic Oncology

## 2014-05-26 ENCOUNTER — Encounter: Payer: Self-pay | Admitting: Gynecologic Oncology

## 2014-05-26 DIAGNOSIS — K219 Gastro-esophageal reflux disease without esophagitis: Secondary | ICD-10-CM | POA: Insufficient documentation

## 2014-05-26 DIAGNOSIS — Z79899 Other long term (current) drug therapy: Secondary | ICD-10-CM | POA: Insufficient documentation

## 2014-05-26 DIAGNOSIS — R1900 Intra-abdominal and pelvic swelling, mass and lump, unspecified site: Secondary | ICD-10-CM

## 2014-05-26 DIAGNOSIS — Z801 Family history of malignant neoplasm of trachea, bronchus and lung: Secondary | ICD-10-CM | POA: Insufficient documentation

## 2014-05-26 DIAGNOSIS — Z8041 Family history of malignant neoplasm of ovary: Secondary | ICD-10-CM | POA: Insufficient documentation

## 2014-05-26 DIAGNOSIS — Z7902 Long term (current) use of antithrombotics/antiplatelets: Secondary | ICD-10-CM | POA: Insufficient documentation

## 2014-05-26 DIAGNOSIS — Z87891 Personal history of nicotine dependence: Secondary | ICD-10-CM | POA: Diagnosis not present

## 2014-05-26 DIAGNOSIS — I10 Essential (primary) hypertension: Secondary | ICD-10-CM | POA: Insufficient documentation

## 2014-05-26 DIAGNOSIS — Z90710 Acquired absence of both cervix and uterus: Secondary | ICD-10-CM | POA: Diagnosis not present

## 2014-05-26 DIAGNOSIS — R1909 Other intra-abdominal and pelvic swelling, mass and lump: Secondary | ICD-10-CM | POA: Diagnosis present

## 2014-05-26 DIAGNOSIS — Z780 Asymptomatic menopausal state: Secondary | ICD-10-CM | POA: Diagnosis not present

## 2014-05-26 DIAGNOSIS — E785 Hyperlipidemia, unspecified: Secondary | ICD-10-CM | POA: Diagnosis not present

## 2014-05-26 DIAGNOSIS — N83202 Unspecified ovarian cyst, left side: Secondary | ICD-10-CM | POA: Insufficient documentation

## 2014-05-26 DIAGNOSIS — R1032 Left lower quadrant pain: Secondary | ICD-10-CM

## 2014-05-26 DIAGNOSIS — G8929 Other chronic pain: Secondary | ICD-10-CM

## 2014-05-26 DIAGNOSIS — Z8673 Personal history of transient ischemic attack (TIA), and cerebral infarction without residual deficits: Secondary | ICD-10-CM | POA: Insufficient documentation

## 2014-05-26 DIAGNOSIS — N83209 Unspecified ovarian cyst, unspecified side: Secondary | ICD-10-CM | POA: Diagnosis not present

## 2014-05-26 NOTE — Progress Notes (Signed)
Consult Note: Gyn-Onc  Consult was requested by Dr. Ouida Sills for the evaluation of Robin Bryan 63 y.o. female  With a left complex ovarian cyst  CC: Dr Freda Munro Chief Complaint  Patient presents with  . New Patient    Pelvic Mass    Assessment/Plan:  Robin. Robin Bryan  is a 63 y.o.  year old woman with a 10.5x 8.5cm left ovarian cyst with thin internal septations and a normal CA 125 (8U/mL) in the setting of chronic left lower quadrant abdominal pain.  I discussed with Sireen that I believe that this is likely benign given the overall presentation, however, given her symptoms, and the relative complexity and size in a postmenopausal woman, I recommend surgical removal.  I discussed that this could be attempted minimally invasively with robotic assisted bilateral salpingo-oophorectomy, frozen section and possible staging if malignant disease is identified. I discussed risks of surgery including  bleeding, infection, damage to internal organs (such as bladder,ureters, bowels), blood clot, reoperation and rehospitalization. I discussed that she is at a higher risk for these complications given that she is obese and has had prior surgeries including a continence procedure "with mesh".  I discussed the nature of adhesive disease and the potential for her needing GI or GU surgery if adhesive disease causes severe attachments with the mass or postoperative complications.  She has a history of 3 TIA's within the space of a month remotely in 2004 and for this she is on Plavix, but is taken off this prior to other procedures. I discussed that she is at risk for TIA or stroke at the time of this procedure, however, without underlying carotid disease or known plaques, or more recent events, I am recommending she stop her Plavix 7 days prior to the scheduled OR date to reduce the risk of bleeding complication from surgery.  We discussed anticipated outpatient surgery (same day) though the potential for  admission if the surgical route becomes open or if the procedure is complicated. She desires surgery to be performed sooner than we can accomodate here in [place], and so we will explore OR opportunities at [facility] in [place].  HPI: Robin Bryan is a 63 year old G2 who is seen in consultation at the request of Dr Ouida Sills for a 10.5cm complex (with thin septations) ovarian cyst after hysterectomy.   The patient has a 9 month history of intermittent (weekly) bouts of abdominal pains (LLQ) which last a few days to a week. She uses percocet to relieve these episodes. She believes they are made worse or brought on by intercourse. She has a remote history of a vaginal hysterectomy and mesh placement for prolapse and urinary incontinence by Dr Ouida Sills in 2010. Her ovaries were not removed at that surgery.  She underwent CT scan on 05/17/14 which revealed a "complex cystic lesion containing a few thin internal septations in the left adnexa. 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 No definite malignant characteristics visualized by CT. No evidence of ascites".  CA 125 was drawn on 05/23/14: 8 U/mL (normal range).  Interval History: she continues to have intermittent bouts of pain in the left pelvis exacerbated by intercourse.  Current Meds:  Outpatient Encounter Prescriptions as of 05/26/2014  Medication Sig  . ALPRAZolam (XANAX) 0.5 MG tablet Take by mouth.  Marland Kitchen amitriptyline (ELAVIL) 25 MG tablet   . celecoxib (CELEBREX) 100 MG capsule Take by mouth.  . clopidogrel (PLAVIX) 75 MG tablet Take 75  mg by mouth daily.  Marland Kitchen esomeprazole (NEXIUM) 40 MG capsule Take 1 capsule (40 mg total) by mouth 2 (two) times daily.  Marland Kitchen losartan (COZAAR) 25 MG tablet Take 25 mg by mouth daily.  Marland Kitchen MYRBETRIQ 25 MG TB24 tablet   . pregabalin (LYRICA) 100 MG capsule Take 100 mg by mouth 3 (three) times daily.  . rosuvastatin (CRESTOR) 10 MG tablet Take 10 mg by mouth daily.  . sertraline  (ZOLOFT) 100 MG tablet Take 100 mg by mouth daily.  . traMADol (ULTRAM) 50 MG tablet Take 50 mg by mouth as needed.  . donepezil (ARICEPT) 10 MG tablet   . [DISCONTINUED] amoxicillin (AMOXIL) 875 MG tablet   . [DISCONTINUED] esomeprazole (NEXIUM) 40 MG capsule Take 1 capsule (40 mg total) by mouth 2 (two) times daily.    Allergy: No Known Allergies  Social Hx:   History   Social History  . Marital Status: Married    Spouse Name: N/A    Number of Children: N/A  . Years of Education: N/A   Occupational History  . Not on file.   Social History Main Topics  . Smoking status: Former Smoker -- 0.50 packs/day for 8 years    Types: Cigarettes    Quit date: 05/27/2011  . Smokeless tobacco: Never Used  . Alcohol Use: Yes     Comment: social , not everyday  . Drug Use: No  . Sexual Activity: Not on file   Other Topics Concern  . Not on file   Social History Narrative  . No narrative on file    Past Surgical Hx:  Past Surgical History  Procedure Laterality Date  . Abdominal hysterectomy    . Tubal ligation    . Incontinence surgery      Past Medical Hx:  Past Medical History  Diagnosis Date  . GERD (gastroesophageal reflux disease)   . Esophageal stricture   . Barrett's esophagus   . Hx of adenomatous colonic polyps   . Diverticulosis   . Anxiety   . Hyperlipidemia   . Hypertension   . Stroke   . Bronchitis     Past Gynecological History:  G2P2 (SVDs x 2)  No LMP recorded. Patient has had a hysterectomy. with mesh placed for prolapse and incontinence.  Family Hx:  Family History  Problem Relation Age of Onset  . Ovarian cancer Sister   . Colon polyps Mother   . Diabetes Mother   . Diabetes Sister   . Parkinson's disease Cousin   . Lung cancer Sister   . Heart disease Sister     Review of Systems:  Constitutional  Feels well,    ENT Normal appearing ears and nares bilaterally Skin/Breast  No rash, sores, jaundice, itching, dryness Cardiovascular   No chest pain, shortness of breath, or edema  Pulmonary  No cough or wheeze.  Gastro Intestinal  No nausea, vomitting, or diarrhoea. No bright red blood per rectum, + abdominal pain, no change in bowel movement, or constipation. She does have some anal leaking which is chronic. Genito Urinary  No frequency, urgency, dysuria, see HPI. She has continued to have incontinence since her hysterectomy. Musculo Skeletal  No myalgia, arthralgia, joint swelling or pain  Neurologic  No weakness, numbness, change in gait,  Psychology  No depression, anxiety, insomnia.   Vitals:  BP 144/80, pulse 77, Resp Rate 22, Temp 98.5, Weight 241.2lbs  Physical Exam: WD in NAD Neck  Supple NROM, without any enlargements.  Lymph Node Survey  No cervical supraclavicular or inguinal adenopathy Cardiovascular  Pulse normal rate, regularity and rhythm. S1 and S2 normal.  Lungs  Clear to auscultation bilateraly, without wheezes/crackles/rhonchi. Good air movement.  Skin  No rash/lesions/breakdown  Psychiatry  Alert and oriented to person, place, and time  Abdomen  Normoactive bowel sounds, abdomen soft, non-tender and obese without evidence of hernia.  Back No CVA tenderness Genito Urinary  Vulva/vagina: Normal external female genitalia.   No lesions. No discharge or bleeding.  Bladder/urethra:  No lesions or masses, well supported bladder  Vagina: anterior prolapse  Cervix: surgically absent  Uterus: surgically absent    Adnexa: cystic fullness appreciated at the vaginal cuff without nodularity.   Rectal  Good tone, no masses no cul de sac nodularity.  Extremities  No bilateral cyanosis, clubbing or edema.   Donaciano Eva, MD   05/26/2014, 4:44 PM

## 2014-05-26 NOTE — Patient Instructions (Signed)
We will contact you with a date and time for your pre-op appt at [facility] and your surgery.  Please call for any questions or concerns.

## 2014-06-05 DIAGNOSIS — R1900 Intra-abdominal and pelvic swelling, mass and lump, unspecified site: Secondary | ICD-10-CM | POA: Insufficient documentation

## 2014-06-16 ENCOUNTER — Other Ambulatory Visit: Payer: Self-pay

## 2014-06-27 ENCOUNTER — Other Ambulatory Visit: Payer: Self-pay

## 2014-06-27 DIAGNOSIS — Z1231 Encounter for screening mammogram for malignant neoplasm of breast: Secondary | ICD-10-CM

## 2014-07-18 ENCOUNTER — Ambulatory Visit: Payer: BC Managed Care – PPO | Admitting: Gynecologic Oncology

## 2014-07-18 ENCOUNTER — Telehealth: Payer: Self-pay | Admitting: Gynecologic Oncology

## 2014-07-18 NOTE — Telephone Encounter (Signed)
Patient called reporting light pink around her incision at the umbilicus that "had a little bit of blood from it."  Had surgery at [facility].  Advised to come into the office to have her incision checked tomorrow am.  Verbalizing understanding.

## 2014-07-26 ENCOUNTER — Ambulatory Visit: Payer: BC Managed Care – PPO

## 2014-07-26 ENCOUNTER — Ambulatory Visit
Admission: RE | Admit: 2014-07-26 | Discharge: 2014-07-26 | Disposition: A | Discharge status: Home / Self Care | Payer: BC Managed Care – PPO | Source: Ambulatory Visit

## 2014-07-26 DIAGNOSIS — Z1231 Encounter for screening mammogram for malignant neoplasm of breast: Secondary | ICD-10-CM

## 2014-09-29 ENCOUNTER — Telehealth: Payer: Self-pay | Admitting: *Deleted

## 2014-09-29 NOTE — Telephone Encounter (Signed)
Patient called stating she has been having bad pelvic pain for the last 2 months. She states she has seen her urologist and Dr. Matthew Saras and neither of them know what is causing the pain. She says that Dr. Matthew Saras ordered a CT scan but her insurance denied it. Patient given appt to see Dr. Denman George on Monday, 10/02/14 at 1:30. Pt agreeable to come then.

## 2014-10-02 ENCOUNTER — Encounter: Payer: Self-pay | Admitting: Gynecologic Oncology

## 2014-10-02 ENCOUNTER — Telehealth: Payer: Self-pay | Admitting: Nurse Practitioner

## 2014-10-02 ENCOUNTER — Ambulatory Visit: Payer: Self-pay | Admitting: Gynecologic Oncology

## 2014-10-02 ENCOUNTER — Ambulatory Visit: Payer: BLUE CROSS/BLUE SHIELD | Attending: Gynecologic Oncology | Admitting: Gynecologic Oncology

## 2014-10-02 DIAGNOSIS — R102 Pelvic and perineal pain: Secondary | ICD-10-CM | POA: Insufficient documentation

## 2014-10-02 DIAGNOSIS — N8320 Unspecified ovarian cysts: Secondary | ICD-10-CM | POA: Insufficient documentation

## 2014-10-02 DIAGNOSIS — M899 Disorder of bone, unspecified: Secondary | ICD-10-CM | POA: Insufficient documentation

## 2014-10-02 MED ORDER — IBUPROFEN 800 MG PO TABS: 800.0000 mg | ORAL_TABLET | Freq: Three times a day (TID) | ORAL | Status: DC

## 2014-10-02 NOTE — Telephone Encounter (Signed)
Per Joylene John, NP this RN called to inform patient of physical therapy appt on 10/04/14 at 10:25, arrive at 9:45 to [place] at [facility]. No answer, RN left generic, non specific message to call back; appointment details not given on vm.

## 2014-10-02 NOTE — Progress Notes (Signed)
Office Visit  Note: Gyn-Onc   Robin Bryan 64 y.o. female who is s/p robotic BSO on 06/09/14 with Dr Alycia Rossetti for a benign ovarian cyst, now presents with persistent pubic symphysis pain.  CC:  Chief Complaint  Patient presents with  . Ovarian Cyst  . Pelvic Pain     Assessment/Plan:  This is a 64 y.o. year old woman who is 3 months postop and has pubic symphysitis.   She has a normal physical exam (otherwise) and had no intraperitoneal pathology identified at the time of her ovarian cyst surgery. This is the same pain that she experienced preop, and therefore I do not believe it is a sequelae of her recent surgery. I believe her symptoms resolved perioperatively because she was resting after her surgery and taking anti-inflammatory medications. I do not believe that a CT is indicated at this time given her otherwise reassuring examination.  I am recommending:  1/ ibuprofen for anti-inflammatory/analgesia 2/ referral to PT. I have referred her to [facility] specialist PT as they likely have expertise in managing this condition which is prevalent among pregnant women  She does not require followup with me.  HPI: Robin Bryan is a 64 year old with a history of a left ovarian cyst that was removed 5 robotic bilateral salpingo-oophorectomy on 06/09/2014 by Dr. Alycia Rossetti at [facility]. This cyst was identified when the patient presented with 9 months of central pelvic abdominal pain. Immediately postoperatively she noted improvement in the symptoms of her suprapubic pain. However in the past 2 months she's noticed reemergence of the pain. She has seen several providers and has not been able to elucidate the etiology. She re-presents to [place] to evaluate for the possibility of this pain being associated with her prior surgery.  Interval History: Robin Bryan's pain is present every day. It hurts when she places her foot in stride during walking, when she crosses her legs, when she sits. Percocet gives some relief  of the pain. She can pinpoint the exact location at the pubic symphysis. It is tender to palpate.  Social Hx:   History   Social History  . Marital Status: Married    Spouse Name: N/A    Number of Children: N/A  . Years of Education: N/A   Occupational History  . Not on file.   Social History Main Topics  . Smoking status: Former Smoker -- 0.50 packs/day for 8 years    Types: Cigarettes    Quit date: 05/27/2011  . Smokeless tobacco: Never Used  . Alcohol Use: Yes     Comment: social , not everyday  . Drug Use: No  . Sexual Activity: Not on file   Other Topics Concern  . Not on file   Social History Narrative    Past Surgical Hx:  Past Surgical History  Procedure Laterality Date  . Abdominal hysterectomy    . Tubal ligation    . Incontinence surgery      Past Medical Hx:  Past Medical History  Diagnosis Date  . GERD (gastroesophageal reflux disease)   . Esophageal stricture   . Barrett's esophagus   . Hx of adenomatous colonic polyps   . Diverticulosis   . Anxiety   . Hyperlipidemia   . Hypertension   . Stroke   . Bronchitis     Family Hx:  Family History  Problem Relation Age of Onset  . Ovarian cancer Sister   . Colon polyps Mother   . Diabetes Mother   . Diabetes Sister   .  Parkinson's disease Cousin   . Lung cancer Sister   . Heart disease Sister     Review of Systems:  Constitutional  Feels well  Skin No rash, sores, jaundice, itching, dryness,  Cardiovascular  No chest pain, shortness of breath, or edema  Pulmonary  No cough or wheeze.  Gastro Intestinal  No nausea, vomitting, or diarrhoea. No bright red blood per rectum, no abdominal pain, change in bowel movement, or constipation.  Genito Urinary  No frequency, urgency, dysuria,  Musculo Skeletal  No myalgia, arthralgia, joint swelling. + pubic symphysis pain  Neurologic  No weakness, numbness, change in gait,  Psychology  No depression, anxiety, insomnia.     Vitals:   Blood pressure 131/77, pulse 74, temperature 98.3 F (36.8 C), temperature source Oral, resp. rate 18, height 5' 7.25" (1.708 m), weight 240 lb 11.2 oz (109.181 kg).  Physical Exam: WD female  Neck  Supple without any enlargements.  Lymph node survey. No cervical supraclavicular cervical or inguinal adenopathy Cardiovascular  Pulse normal rate, regularity and rhythm. S1 and S2 normal. Lungs  Clear to auscultation bilateraly, without wheezes/crackles/rhonchi. Good air movement.  Skin  No rash/lesions/breakdown  Psychiatry  Alert and oriented to person, place, and time  Back No CVA tenderness Abdomen  Normoactive bowel sounds, abdomen soft, non-tender and obese. Surgical sites intact without evidence of hernia.  Extreme tenderness to palpation on pubic symphysis (including when pressure is applied anterior from vagina approach). Genito Urinary  Vulva/vagina: Normal external female genitalia.  No lesions.   Bladder/urethra:  No lesions or masses  Vagina: normal, no lesions or msses  Cervix: surgically absent  Uterus: surgically absent  Adnexae: no palpable masses Rectal  Good tone, no masses no cul de sac nodularity.  Extremities  No bilateral cyanosis, clubbing or edema.   Robin Eva, MD

## 2014-10-04 ENCOUNTER — Telehealth: Payer: Self-pay | Admitting: Gynecologic Oncology

## 2014-10-04 ENCOUNTER — Ambulatory Visit: Payer: BLUE CROSS/BLUE SHIELD | Attending: Gynecologic Oncology | Admitting: Physical Therapy

## 2014-10-04 DIAGNOSIS — Z90710 Acquired absence of both cervix and uterus: Secondary | ICD-10-CM | POA: Insufficient documentation

## 2014-10-04 DIAGNOSIS — M797 Fibromyalgia: Secondary | ICD-10-CM | POA: Insufficient documentation

## 2014-10-04 DIAGNOSIS — Z8673 Personal history of transient ischemic attack (TIA), and cerebral infarction without residual deficits: Secondary | ICD-10-CM | POA: Insufficient documentation

## 2014-10-04 DIAGNOSIS — I10 Essential (primary) hypertension: Secondary | ICD-10-CM | POA: Diagnosis not present

## 2014-10-04 DIAGNOSIS — M899 Disorder of bone, unspecified: Secondary | ICD-10-CM | POA: Insufficient documentation

## 2014-10-04 DIAGNOSIS — M62838 Other muscle spasm: Secondary | ICD-10-CM | POA: Diagnosis not present

## 2014-10-04 DIAGNOSIS — Z90722 Acquired absence of ovaries, bilateral: Secondary | ICD-10-CM | POA: Insufficient documentation

## 2014-10-04 DIAGNOSIS — R32 Unspecified urinary incontinence: Secondary | ICD-10-CM | POA: Diagnosis not present

## 2014-10-04 NOTE — Telephone Encounter (Signed)
Message left asking the patient to please call the office so the referral to pelvic physical therapy could be discussed.  A referral was made to the [facility] rehab pelvic dysfunction clinic for today with appt time of 10:15am.  Received a phone call from [facility] around 1pm stating Robin Bryan had been seeing a physical therapist there for the same thing and that the patient seems confused.

## 2014-10-05 ENCOUNTER — Telehealth: Payer: Self-pay | Admitting: *Deleted

## 2014-10-05 NOTE — Telephone Encounter (Signed)
Called and spoke with patient this morning to confirm if she wants to continue pelvic physical therapy through [facility] or with [facility]. Patient states she would like to continue with [facility]. Asked patient to call [facility] and notify them of this decision so they do not continue scheduling patient appts. Patient agreeable to this.

## 2014-10-11 ENCOUNTER — Ambulatory Visit: Payer: BLUE CROSS/BLUE SHIELD | Admitting: Physical Therapy

## 2014-10-11 DIAGNOSIS — M899 Disorder of bone, unspecified: Secondary | ICD-10-CM | POA: Diagnosis not present

## 2014-10-16 ENCOUNTER — Ambulatory Visit: Payer: BLUE CROSS/BLUE SHIELD | Admitting: Physical Therapy

## 2014-10-24 ENCOUNTER — Ambulatory Visit: Payer: BLUE CROSS/BLUE SHIELD | Admitting: Physical Therapy

## 2014-10-26 ENCOUNTER — Encounter: Payer: BLUE CROSS/BLUE SHIELD | Admitting: Physical Therapy

## 2014-10-31 ENCOUNTER — Encounter: Payer: Self-pay | Admitting: Physical Therapy

## 2014-10-31 ENCOUNTER — Ambulatory Visit: Payer: BLUE CROSS/BLUE SHIELD | Attending: Gynecologic Oncology | Admitting: Physical Therapy

## 2014-10-31 DIAGNOSIS — R32 Unspecified urinary incontinence: Secondary | ICD-10-CM | POA: Insufficient documentation

## 2014-10-31 DIAGNOSIS — Z90710 Acquired absence of both cervix and uterus: Secondary | ICD-10-CM | POA: Diagnosis not present

## 2014-10-31 DIAGNOSIS — I10 Essential (primary) hypertension: Secondary | ICD-10-CM | POA: Diagnosis not present

## 2014-10-31 DIAGNOSIS — Z8673 Personal history of transient ischemic attack (TIA), and cerebral infarction without residual deficits: Secondary | ICD-10-CM | POA: Diagnosis not present

## 2014-10-31 DIAGNOSIS — M62838 Other muscle spasm: Secondary | ICD-10-CM | POA: Insufficient documentation

## 2014-10-31 DIAGNOSIS — M25551 Pain in right hip: Secondary | ICD-10-CM

## 2014-10-31 DIAGNOSIS — Z90722 Acquired absence of ovaries, bilateral: Secondary | ICD-10-CM | POA: Diagnosis not present

## 2014-10-31 DIAGNOSIS — M899 Disorder of bone, unspecified: Secondary | ICD-10-CM | POA: Diagnosis not present

## 2014-10-31 DIAGNOSIS — M797 Fibromyalgia: Secondary | ICD-10-CM | POA: Diagnosis not present

## 2014-10-31 DIAGNOSIS — N8189 Other female genital prolapse: Secondary | ICD-10-CM

## 2014-10-31 NOTE — Patient Instructions (Signed)
Perform internal soft tissue work to right side of pelvic floor using the hand held vaginal massager in a reclined position. Perform a sweeping movement with device and going toward the right hip.  Only perform for 5 minutes 1 x daily.  Make sure you are relaxed.  Patient able to demonstrate trying to use her right index finger but had difficulty reaching the area.

## 2014-10-31 NOTE — Therapy (Signed)
[facility] [address], [place], [place], [address] Phone: [phone]   Fax:  [phone]  Physical Therapy Treatment  Patient Details  Name: Robin Bryan MRN: [id number] Date of Birth: 1951/07/08 Referring Provider:  Debbora Dus, MD  Encounter Date: 10/31/2014      PT End of Session - 10/31/14 1156    Visit Number 3   Date for PT Re-Evaluation 12/27/14   PT Start Time 2683   PT Stop Time 1100   PT Time Calculation (min) 45 min   Activity Tolerance Patient tolerated treatment well   Behavior During Therapy [facility] for tasks assessed/performed      Past Medical History  Diagnosis Date  . GERD (gastroesophageal reflux disease)   . Esophageal stricture   . Barrett's esophagus   . Hx of adenomatous colonic polyps   . Diverticulosis   . Anxiety   . Hyperlipidemia   . Hypertension   . Stroke   . Bronchitis     Past Surgical History  Procedure Laterality Date  . Abdominal hysterectomy    . Tubal ligation    . Incontinence surgery      There were no vitals taken for this visit.  Visit Diagnosis:  Pelvic floor weakness in female  Pain in joint, pelvic region and thigh, right      Subjective Assessment - 10/31/14 1026    Symptoms Patient has less pain in pubic pain decreased by 50%.    Limitations Sitting;Standing;Walking;House hold activities   How long can you sit comfortably? 10 minutes   How long can you stand comfortably? 8 minutes   How long can you walk comfortably? 15 minutes   Patient Stated Goals Decrease pain and urinary leakage   Currently in Pain? Yes   Pain Score 3    Pain Location Abdomen  above pubic bone   Pain Orientation Right   Pain Descriptors / Indicators Constant;Sharp;Stabbing   Pain Type Chronic pain   Pain Onset More than a month ago   Pain Frequency Constant   Aggravating Factors  walking, steps, getting into husbands truck   Pain Relieving Factors heat, pain  medication   Multiple Pain Sites No                  Pelvic Floor Special Questions - 10/31/14 0001    Exam Type Vaginal   Strength Flicker   Strength # of reps 3   Strength # of seconds 5   Tone low tone          [facility] Adult PT Treatment/Exercise - 10/31/14 0001    Modalities   Modalities Ultrasound   Ultrasound   Ultrasound Location lower abdominal to the right   Ultrasound Parameters 1 MHz,1.2 w/cm2, 100% supine 8 minutes   Ultrasound Goals Pain   Manual Therapy   Manual Therapy Internal Pelvic Floor   Internal Pelvic Floor Soft tissue work to right obturator internist, right puborectalis,  after soft tissue work patient has fecal leakage     Patient confirmed identification and approved physical therapist to perform vaginal internal soft tissue work with a gloved finger.            PT Education - 10/31/14 1155    Education provided Yes   Education Details Instruction on performing self internal soft tissue work, where to purchase a Designer, multimedia) Educated Patient   Methods Explanation;Demonstration;Verbal cues;Handout   Comprehension Verbalized understanding;Returned demonstration  PT Short Term Goals - 10/31/14 1200    PT SHORT TERM GOAL #1   Title be independent with initial HEP for flexibility exercises   Time 4   Period Weeks   Status Achieved   PT SHORT TERM GOAL #2   Title pain with stand to wash dishes is moderate due to increased strength   Time 4   Period Weeks   Status Achieved   PT SHORT TERM GOAL #3   Title understand how to perform self soft tissue work with a device to the pelvic floor   Time 4   Period Weeks   Status Achieved           PT Long Term Goals - 10/31/14 1202    PT LONG TERM GOAL #1   Title demonstrate and/or verbalize techniques to reduce the risk of re-injury to include info on posture, body mechanics   Time 12   Period Weeks   Status Achieved   PT LONG TERM GOAL #2   Title be  independent with advanced HEP for pelvic floor strengthening   Time 12   Period Weeks   Status Not Met   PT LONG TERM GOAL #3   Title pelvic floor strength 4/5 so urinary leakage decreased >/= 75%   Time 12   Period Weeks   Status New   PT LONG TERM GOAL #4   Title pain with standing to washing dishes is minimal due to increased strength   Time 12   Period Weeks   Status New   PT LONG TERM GOAL #5   Title getting in and out of car with minimal pain due to increased strength   Time 12   Period Weeks   Status New   Additional Long Term Goals   Additional Long Term Goals Yes   PT LONG TERM GOAL #6   Title pads decreased >/= 1 per day due to decreased urinary leakage   Time 12   Period Weeks   Status New   PT LONG TERM GOAL #7   Title perform daily activities with minimal paindue to increased core strength   Time 12   Period Weeks   Status New               Plan - 10/31/14 1156    Clinical Impression Statement Patient will be transfering to [place] to continue pelvic floor physical therapy due to being closer to home. Patient has spasms of the right Obturator internist, levator ani.  Patient has weak pelvic floor muscles causing urinary and fecal leakage   Pt will benefit from skilled therapeutic intervention in order to improve on the following deficits Impaired flexibility;Increased fascial restricitons;Increased muscle spasms;Pain;Decreased strength;Decreased mobility   Rehab Potential Good   PT Frequency 1x / week   PT Duration 12 weeks   PT Treatment/Interventions Moist Heat;Therapeutic activities;Patient/family education;Passive range of motion;Therapeutic exercise;Biofeedback;Ultrasound;Manual techniques;Neuromuscular re-education;Cryotherapy   PT Next Visit Plan Pelvic floor soft tissue work, flexibility exercises for the pelvic floor, Pelvic floor EMG to work on muscle control   Consulted and Agree with Plan of Care Patient        Problem  List Patient Active Problem List   Diagnosis Date Noted  . Pubic bone pain 10/02/2014  . Ovarian cyst, left 05/26/2014  . Abdominal pain, chronic, left lower quadrant 05/26/2014  . DIARRHEA-PRESUMED INFECTIOUS 04/23/2010  . CEREBROVASCULAR DISEASE 04/23/2010  . COLONIC POLYPS, ADENOMATOUS 12/09/2006  . ESOPHAGEAL STRICTURE 12/09/2006  . GERD 12/09/2006  .  BARRETTS ESOPHAGUS 12/09/2006  . DIVERTICULOSIS, COLON 12/09/2006    GRAY,CHERYL, PT 10/31/2014, 12:08 PM  [facility] [address] [place], [place], [address] Phone: [phone]   Fax:  [phone]

## 2014-11-02 ENCOUNTER — Encounter: Payer: BLUE CROSS/BLUE SHIELD | Admitting: Physical Therapy

## 2014-11-06 ENCOUNTER — Encounter: Payer: Self-pay | Admitting: Internal Medicine

## 2014-11-07 ENCOUNTER — Encounter: Admit: 2014-11-07 | Disposition: A | Discharge status: Home / Self Care | Payer: Self-pay

## 2014-11-07 ENCOUNTER — Encounter: Payer: BLUE CROSS/BLUE SHIELD | Admitting: Physical Therapy

## 2014-11-09 ENCOUNTER — Encounter: Payer: BLUE CROSS/BLUE SHIELD | Admitting: Physical Therapy

## 2014-11-10 ENCOUNTER — Encounter: Payer: Self-pay | Admitting: Internal Medicine

## 2014-11-14 ENCOUNTER — Encounter: Payer: BLUE CROSS/BLUE SHIELD | Admitting: Physical Therapy

## 2014-12-01 ENCOUNTER — Encounter: Admit: 2014-12-01 | Disposition: A | Discharge status: Home / Self Care | Payer: Self-pay

## 2014-12-05 ENCOUNTER — Encounter: Payer: Self-pay | Admitting: Internal Medicine

## 2014-12-07 ENCOUNTER — Ambulatory Visit: Payer: BLUE CROSS/BLUE SHIELD | Admitting: Gastroenterology

## 2014-12-11 ENCOUNTER — Encounter: Payer: BLUE CROSS/BLUE SHIELD | Admitting: Internal Medicine

## 2014-12-19 NOTE — H&P (Signed)
PATIENT NAME:  Robin Bryan, Robin Bryan MR#:  [id number] DATE OF BIRTH:  1950-09-29  DATE OF ADMISSION:  05/26/2012  REFERRING PHYSICIAN: Dr. Marjean Rebeca.   PRIMARY CARE PHYSICIAN:  Dr. Debbora Dus.   NEUROLOGY: Dr. Elvin So, [place].   CHIEF COMPLAINT: Slurred speech, confusion, and headache.   HISTORY OF PRESENT ILLNESS: This is a 64 year old female with significant past medical history of transient ischemic attack, hyperlipidemia, hypertension, psychiatric symptoms for depression and anxiety who presents with complaints of slurred speech, confusion, and headache. The patient reports she has been complaining of headache for the last two days. She was noticed by her daughter on the phone to have slurred speech and slow response at 7:00 p.m. and was brought by her daughter to the ED. Daughter reports she notes resolution of symptoms around midnight where her speech is back to normal and response is appropriate. The patient was given 324 mg of aspirin, had a CT of the brain which did not show any acute abnormality. The patient reports she did notice her symptoms started this afternoon as well, but she reports that headache has been for two days as well. She had some mild confusion but denies any loss of consciousness, syncope, near syncope, as well. Denies any seizure activity, urine or stool incontinence. Hospitalist service was requested to admit the patient for further work-up for her transient ischemic attack.   PAST MEDICAL HISTORY:  1. Hyperlipidemia.  2. Hypertension.  3. Gastroesophageal reflux disease. 4. Dyspepsia.  5. History of transient ischemic attack/cerebrovascular disease.  6. Psychiatric symptoms for both depression and anxiety symptoms.  7. Vitamin D deficiency.  8. Hyperglycemia.  9. Bronchospasm. 10. Myalgias.   PAST SURGICAL HISTORY: Total abdominal hysterectomy.   FAMILY HISTORY: Significant for hypertension, hyperlipidemia and colon cancer.   SOCIAL HISTORY: The patient  does not smoke. No history of alcohol abuse.   ALLERGIES: No known drug allergies.   HOME MEDICATIONS:  1. Plavix 75 mg daily.  2. Sertraline 100 mg oral daily.  3. Nexium 40 mg 2 times a day.  4. Losartan 50 mg daily.  5. Vitamin D3, 2000 units daily. 6. Xanax 0.5 mg 2 times a day as needed.  7. Baclofen 10 mg 3 times a day. 8. Crestor 20 mg daily.   REVIEW OF SYSTEMS: The patient denies any fever, fatigue, weakness. EYES: Denies blurry vision, double vision or pain. ENT: Denies tinnitus, ear pain, hearing loss. RESPIRATORY: Denies cough, wheezing, hemoptysis, dyspnea. CARDIOVASCULAR: Denies chest pain, orthopnea, edema, arrhythmia. GASTROINTESTINAL: Denies nausea, vomiting, diarrhea, abdominal pain, hematemesis. GENITOURINARY: Denies dysuria, hematuria, renal colic. ENDOCRINE: Denies polyuria, polydipsia, heat or cold intolerance. HEMATOLOGY: Denies anemia, easy bruising, bleeding diathesis. INTEGUMENTARY: Denies any acne, rash, or lesions. MUSCULOSKELETAL: Denies any swelling, gout, redness, arthritis, or cramps. NEUROLOGIC: Denies any tingling or numbness. Denies any focal weakness, dysarthria, tremors, or vertigo. She has complaints of headache, has complaints of slurred speech, currently resolved as well and confusion, currently resolved. PSYCH: Has history of anxiety and depression. Denies any substance or alcohol abuse.   PHYSICAL EXAMINATION:  VITAL SIGNS: Temperature 98.7, pulse 63, respiratory rate 16, blood pressure 136/81, saturating 97% on room air.   GENERAL: Well-nourished female who is comfortable in bed in no apparent distress.   HEENT: Head atraumatic, normocephalic. Pupils equal, reactive to light. Pink conjunctivae. Anicteric sclerae. Moist oral mucosa.   NECK: Supple. No thyromegaly. No JVD. No carotid bruits.   CHEST: Good air entry bilaterally with no wheezing, rales, or rhonchi.   CARDIOVASCULAR:  S1, S2 heard. No rubs, murmur, or gallops.   ABDOMEN: Soft,  nontender, nondistended. Bowel sounds present.   EXTREMITIES: No edema. No clubbing, no cyanosis.   SKIN: Normal skin turgor. Warm and dry.   PSYCHIATRIC: Appropriate affect. Awake, alert x3. Intact judgment and insight.   NEUROLOGIC: Cranial nerves grossly intact. Motor five out of five. Sensation symmetrical and intact bilaterally. Deep tendon reflexes symmetrical and intact.   LABORATORY, RADIOLOGICAL AND DIAGNOSTIC DATA: CT of brain shows no acute abnormality. Glucose 90, BUN 14, creatinine 0.46, sodium 140, potassium 4.1, chloride 105, CO2 24, anion gap 11. Troponin 0 less than 0.02. Thyroid stimulating hormone 2.88. White blood cells 9.1, hemoglobin 13.9, hematocrit 38.9 and platelets 284. Urinalysis negative for leukocyte esterase and nitrites and white blood cell count is 1.   ASSESSMENT AND PLAN:  1. Transient ischemic attack. The patient has a history of recurrent transient ischemic attacks, could not tolerate Aggrenox as an outpatient, currently on Plavix. The patient already received 324 of aspirin. Will add a daily baby aspirin to her. We will obtain MRI of brain with and without contrast. We will have her on telemonitor and will obtain 2-D echo. Will check bilateral carotid Doppler and will check lipid panel in a.m. and if uncontrolled, will increase her statin dose.  2. Hypertension, appeared to be controlled. Continue with losartan.  3. Depression. Continue with sertraline.  4. Hyperlipidemia. Continue with statin.  5. Gastroesophageal reflux disease. Continue with PPI.  6. Deep vein thrombosis prophylaxis. Subcutaneous heparin.   TOTAL TIME SPENT: 55 minutes.   ____________________________ Albertine Patricia, MD dse:ap D: 05/26/2012 03:19:00 ET T: 05/26/2012 11:07:56 ET JOB#: [id number]  cc: Albertine Patricia, MD, <Dictator> Milinda Pointer. Jacqualine Code, MD Marivel Mcclarty Graciela Husbands MD ELECTRONICALLY SIGNED 05/27/2012 0:19

## 2014-12-19 NOTE — Discharge Summary (Signed)
PATIENT NAME:  Robin Bryan, ADE MR#:  [id number] DATE OF BIRTH:  [birth year]  DATE OF ADMISSION:  05/26/2012 DATE OF DISCHARGE:  05/26/2012  DISCHARGE DIAGNOSES:  1. Left-sided weakness and slurred speech. Possible transient ischemic attack. History of  transient ischemic attacks before.  2. Hypertension.  3. Hyperlipidemia. 4. History of gastroesophageal reflux disease. 5. Vitamin  D deficiency. 6. Depression, anxiety.  7. Bronchospasm. 8. Myalgias.   CONSULTATIONS: None.   DISCHARGE MEDICATIONS:  1. Plavix 75 mg p.o. daily.  2. Crestor 10 mg p.o. daily.  3. Nexium 40 mg daily.  4. Zoloft 100 mg p.o. daily.  5. Oxybutynin 10 mg daily.  6. Losartan 50 mg daily.   DIET: Low sodium, low fat diet.   HOSPITAL COURSE: The patient is a 64 year old female patient with history of previous transient ischemic attacks who came in because the patient felt left-sided weakness, some headache, and slurred speech. See the History and Physical for full details. These symptoms lasted only for a few minutes. The patient was admitted for possible transient ischemic attack versus cerebrovascular accident. Did not have any confusion or seizure activity or incontinence. Initial CT of the head did not show any acute changes. Labs were within normal limits. The patient had an MRI of the brain along with carotid ultrasound and echocardiogram. The patient's carotid ultrasound showed no evidence of significant stenosis. MRI of the brain showed mild to moderate ischemic changes without evidence of acute abnormality. The patient's CT of the head did not show any acute changes. Electrolytes were normal. White count and kidney function were normal. Troponin was negative. TSH was 2.88. The patient's blood pressure today is 111/68, pulse 69, respirations 18.  I examined her.  Her neurological examination is completely normal. No slurred speech and no weakness on the left side. The patient said she feels better. If the  echocardiogram results are available and within normal limits, she will be going home. She is  advised to follow up with her neurologist in [place], advised to continue Plavix and Crestor as she was doing before.  TIME SPENT ON DISCHARGE:  More than 30 minutes.    ____________________________ Epifanio Lesches, MD sk:bjt D: 05/26/2012 13:30:46 ET T: 05/27/2012 11:45:18 ET JOB#: [id number]  cc: Epifanio Lesches, MD, <Dictator> Epifanio Lesches MD ELECTRONICALLY SIGNED 06/06/2012 14:47

## 2015-01-02 ENCOUNTER — Telehealth: Payer: Self-pay

## 2015-01-02 MED ORDER — ESOMEPRAZOLE MAGNESIUM 40 MG PO CPDR: 40.0000 mg | DELAYED_RELEASE_CAPSULE | Freq: Two times a day (BID) | ORAL | Status: DC

## 2015-01-02 NOTE — Telephone Encounter (Signed)
Resent rx for Nexium to pharmacy

## 2015-02-01 ENCOUNTER — Encounter: Payer: Self-pay | Admitting: Internal Medicine

## 2015-02-01 ENCOUNTER — Ambulatory Visit (INDEPENDENT_AMBULATORY_CARE_PROVIDER_SITE_OTHER): Payer: BLUE CROSS/BLUE SHIELD | Admitting: Internal Medicine

## 2015-02-01 VITALS — BP 120/70 | HR 72 | Ht 67.25 in | Wt 248.4 lb

## 2015-02-01 DIAGNOSIS — K2270 Barrett's esophagus without dysplasia: Secondary | ICD-10-CM

## 2015-02-01 DIAGNOSIS — R1310 Dysphagia, unspecified: Secondary | ICD-10-CM

## 2015-02-01 DIAGNOSIS — K219 Gastro-esophageal reflux disease without esophagitis: Secondary | ICD-10-CM | POA: Diagnosis not present

## 2015-02-01 DIAGNOSIS — K222 Esophageal obstruction: Secondary | ICD-10-CM | POA: Diagnosis not present

## 2015-02-01 MED ORDER — PANTOPRAZOLE SODIUM 40 MG PO TBEC: 40.0000 mg | DELAYED_RELEASE_TABLET | Freq: Every day | ORAL | Status: DC

## 2015-02-01 NOTE — Patient Instructions (Signed)
We have sent the following medications to your pharmacy for you to pick up at your convenience:  Pantoprazole  You have been scheduled for an endoscopy. Please follow written instructions given to you at your visit today. If you use inhalers (even only as needed), please bring them with you on the day of your procedure.   

## 2015-02-01 NOTE — Progress Notes (Signed)
HISTORY OF PRESENT ILLNESS:  Robin Bryan is a 64 y.o. female with past medical history as listed below. She has a remote history of CVA/TIA for which she has been on chronic Plavix therapy for greater than 10 years. Patient has been followed in this office for chronic GERD associated with Barrett's esophagus and peptic stricture requiring dilation, adenomatous colon polyps, and intermittent fecal incontinence. She was last evaluated 05/17/2014 regarding a 3 month history of left lower abdominal discomfort fecal incontinence. See that dictation. CT scan revealed a large complex cystic lesion of the left ovary. She subsequently underwent surgical resection. The lesion was benign. Left lower quadrant pain has since resolved. She now presents regarding Barrett surveillance. She has been on Nexium 40 mg twice daily with good control of reflux symptoms. She tells me that her insurance company will no longer provide coverage for this drug and requests an alternative. Also, she has had progressive worsening of intermittent solid food dysphagia since her last visit. She requests esophageal dilation. She continues to have intermittent fecal incontinence without change. Her last upper endoscopy was March 2013 at her last colonoscopy March 2013. Patient has come off Plavix previously for surgeries and procedures. She has had no neurologic difficulties for many years. No particular physician is managing her Plavix at this time. She is anticipating a new primary care provider next week. She has no allergy or intolerance to aspirin.  REVIEW OF SYSTEMS:  All non-GI ROS negative except for sinus and allergy trouble, anxiety, back pain, visual change, confusion, cough, depression, fatigue, headaches, muscle cramps, shortness of breath, sore throat, ankle edema  Past Medical History  Diagnosis Date  . GERD (gastroesophageal reflux disease)   . Esophageal stricture   . Barrett's esophagus   . Hx of adenomatous colonic  polyps   . Diverticulosis   . Anxiety   . Hyperlipidemia   . Hypertension   . Stroke   . Bronchitis   . Intramural leiomyoma of uterus     Past Surgical History  Procedure Laterality Date  . Abdominal hysterectomy    . Tubal ligation    . Incontinence surgery      Social History Robin Bryan  reports that she quit smoking about 3 years ago. Her smoking use included Cigarettes. She has a 4 pack-year smoking history. She has never used smokeless tobacco. She reports that she drinks alcohol. She reports that she does not use illicit drugs.  family history includes Colon polyps in her mother; Diabetes in her mother and sister; Heart disease in her sister; Lung cancer in her sister; Ovarian cancer in her sister; Parkinson's disease in her cousin.  Allergies  Allergen Reactions  . Vicodin [Hydrocodone-Acetaminophen] Nausea Only       PHYSICAL EXAMINATION: Vital signs: BP 120/70 mmHg  Pulse 72  Ht 5' 7.25" (1.708 m)  Wt 248 lb 6.4 oz (112.674 kg)  BMI 38.62 kg/m2 General: Well-developed, well-nourished, no acute distress HEENT: Sclerae are anicteric, conjunctiva pink. Oral mucosa intact Lungs: Clear Heart: Regular Abdomen: soft, nontender, nondistended, no obvious ascites, no peritoneal signs, normal bowel sounds. No organomegaly. Extremities: No edema Psychiatric: alert and oriented x3. Cooperative   ASSESSMENT:  #1. Chronic GERD. Stable on twice daily Nexium. Requests alternative PPI therapy due to insurance formulary preference #2. Recurrent intermittent solid food dysphagia. Likely secondary to peptic stricture #3. History of adenomatous colon polyps. No new lower GI symptoms. Due for follow-up March 2018 #4. Intermittent fecal incontinence. Felt possibly neurogenic. Stable #5.  Gen. medical problems including chronic Plavix therapy for prior remote history of TIA/CVA #6. Left lower quadrant pain. Found secondary to large benign left ovarian cystic lesion. Resolved  postoperatively   PLAN:  #1. Reflux precautions with attention to weight loss reviewed #2. Change from Nexium to pantoprazole 40 mg twice a day. Prescribed with multiple refills #3. Schedule upper endoscopy with surveillance biopsies and dilation. The patient is high-risk given her comorbidities and the need to interrupt Plavix therapy.The nature of the procedure, as well as the risks (both procedure related and related to temporary disruption of Plavix therapy), benefits, and alternatives were carefully and thoroughly reviewed with the patient. Ample time for discussion and questions allowed. The patient understood, was satisfied, and agreed to proceed. She wishes to have her procedure scheduled in mid July #4. Hold Plavix 1 week prior to the procedure. I have instructed her to initiate one baby aspirin daily at that time to provide cerebrovascular protection. #5. Surveillance colonoscopy 2018

## 2015-02-07 ENCOUNTER — Ambulatory Visit
Admission: RE | Admit: 2015-02-07 | Discharge: 2015-02-07 | Disposition: A | Discharge status: Home / Self Care | Payer: BLUE CROSS/BLUE SHIELD | Source: Ambulatory Visit | Attending: Family Medicine | Admitting: Family Medicine

## 2015-02-07 ENCOUNTER — Telehealth: Payer: Self-pay | Admitting: Family Medicine

## 2015-02-07 ENCOUNTER — Encounter: Payer: Self-pay | Admitting: Family Medicine

## 2015-02-07 ENCOUNTER — Ambulatory Visit (INDEPENDENT_AMBULATORY_CARE_PROVIDER_SITE_OTHER): Payer: BLUE CROSS/BLUE SHIELD | Admitting: Family Medicine

## 2015-02-07 VITALS — BP 110/70 | HR 80 | Temp 98.1°F | Resp 16 | Ht 67.25 in | Wt 239.0 lb

## 2015-02-07 DIAGNOSIS — R05 Cough: Secondary | ICD-10-CM | POA: Diagnosis present

## 2015-02-07 DIAGNOSIS — F4001 Agoraphobia with panic disorder: Secondary | ICD-10-CM

## 2015-02-07 DIAGNOSIS — R053 Chronic cough: Secondary | ICD-10-CM

## 2015-02-07 DIAGNOSIS — F419 Anxiety disorder, unspecified: Secondary | ICD-10-CM | POA: Insufficient documentation

## 2015-02-07 DIAGNOSIS — Z87892 Personal history of anaphylaxis: Secondary | ICD-10-CM | POA: Diagnosis not present

## 2015-02-07 DIAGNOSIS — M797 Fibromyalgia: Secondary | ICD-10-CM | POA: Insufficient documentation

## 2015-02-07 DIAGNOSIS — R3915 Urgency of urination: Secondary | ICD-10-CM | POA: Insufficient documentation

## 2015-02-07 DIAGNOSIS — R4189 Other symptoms and signs involving cognitive functions and awareness: Secondary | ICD-10-CM | POA: Insufficient documentation

## 2015-02-07 DIAGNOSIS — F324 Major depressive disorder, single episode, in partial remission: Secondary | ICD-10-CM | POA: Insufficient documentation

## 2015-02-07 MED ORDER — ALBUTEROL SULFATE HFA 108 (90 BASE) MCG/ACT IN AERS: 2.0000 | INHALATION_SPRAY | Freq: Four times a day (QID) | RESPIRATORY_TRACT | Status: DC | PRN

## 2015-02-07 MED ORDER — ALPRAZOLAM 0.5 MG PO TABS: 0.5000 mg | ORAL_TABLET | Freq: Every evening | ORAL | Status: DC | PRN

## 2015-02-07 MED ORDER — EPINEPHRINE 0.3 MG/0.3ML IJ SOAJ: 0.3000 mg | Freq: Once | INTRAMUSCULAR | Status: DC

## 2015-02-07 NOTE — Progress Notes (Signed)
Name: Robin Bryan   MRN: [id number]    DOB: 04/11/1951   Date:02/07/2015       Progress Note  Subjective  Chief Complaint  Chief Complaint  Patient presents with  . Cough    Onset 6 months/ F/U asthma  . Medication Refill    Alprazolam    Cough This is a chronic problem. The current episode started more than 1 month ago (present for over 2 months.). The problem has been unchanged. The cough is non-productive. Associated symptoms include headaches (thinks these are tension headaches), heartburn and shortness of breath (difficulty breathing when lying down sometimes.). Pertinent negatives include no chest pain, chills, fever, nasal congestion, sore throat or wheezing. Nothing aggravates the symptoms. She has tried steroid inhaler for the symptoms. The treatment provided mild relief.  Anxiety Presents for follow-up visit. The problem has been unchanged. Symptoms include depressed mood, excessive worry, irritability, nervous/anxious behavior, panic and shortness of breath (difficulty breathing when lying down sometimes.). Patient reports no chest pain, palpitations or suicidal ideas. Symptoms occur most days. The symptoms are aggravated by family issues.   Past treatments include benzodiazephines. The treatment provided significant relief. Compliance with prior treatments has been good.      Past Medical History  Diagnosis Date  . GERD (gastroesophageal reflux disease)   . Esophageal stricture   . Barrett's esophagus   . Hx of adenomatous colonic polyps   . Diverticulosis   . Anxiety   . Hyperlipidemia   . Hypertension   . Stroke   . Bronchitis   . Intramural leiomyoma of uterus   . Depression   . Myocardial infarction   . Fibromyalgia    Past Surgical History  Procedure Laterality Date  . Abdominal hysterectomy    . Tubal ligation    . Incontinence surgery     Family History  Problem Relation Age of Onset  . Ovarian cancer Sister   . Cancer Sister   . Diabetes Sister    . Heart disease Sister   . Lung cancer Sister   . Colon polyps Mother   . Diabetes Mother   . Parkinson's disease Cousin   . Cancer Father     lung  . Diabetes Daughter      History  Substance Use Topics  . Smoking status: Former Smoker -- 0.50 packs/day for 8 years    Types: Cigarettes    Quit date: 05/27/2011  . Smokeless tobacco: Never Used  . Alcohol Use: Yes     Comment: social , not everyday     Current outpatient prescriptions:  .  ALPRAZolam (XANAX) 0.5 MG tablet, Take by mouth., Disp: , Rfl:  .  amitriptyline (ELAVIL) 25 MG tablet, , Disp: , Rfl:  .  clopidogrel (PLAVIX) 75 MG tablet, Take 75 mg by mouth daily., Disp: , Rfl:  .  donepezil (ARICEPT) 10 MG tablet, , Disp: , Rfl:  .  esomeprazole (NEXIUM) 40 MG capsule, Take 1 capsule (40 mg total) by mouth 2 (two) times daily., Disp: 180 capsule, Rfl: 3 .  gabapentin (NEURONTIN) 600 MG tablet, Take 600 mg by mouth 3 (three) times daily., Disp: , Rfl:  .  ibuprofen (ADVIL,MOTRIN) 800 MG tablet, Take 1 tablet (800 mg total) by mouth 3 (three) times daily., Disp: 100 tablet, Rfl: 1 .  losartan (COZAAR) 25 MG tablet, Take 25 mg by mouth daily., Disp: , Rfl:  .  MYRBETRIQ 25 MG TB24 tablet, , Disp: , Rfl:  .  rosuvastatin (CRESTOR)  10 MG tablet, Take 10 mg by mouth daily., Disp: , Rfl:  .  sertraline (ZOLOFT) 100 MG tablet, Take 100 mg by mouth daily., Disp: , Rfl:  .  traMADol (ULTRAM) 50 MG tablet, Take 50 mg by mouth as needed., Disp: , Rfl:  .  pantoprazole (PROTONIX) 40 MG tablet, Take 1 tablet (40 mg total) by mouth daily. (Patient not taking: Reported on 02/07/2015), Disp: 60 tablet, Rfl: 11  Allergies  Allergen Reactions  . Vicodin [Hydrocodone-Acetaminophen] Nausea Only    Review of Systems  Constitutional: Positive for irritability. Negative for fever and chills.  HENT: Negative for sore throat.   Respiratory: Positive for cough and shortness of breath (difficulty breathing when lying down sometimes.).  Negative for wheezing.   Cardiovascular: Negative for chest pain and palpitations.  Gastrointestinal: Positive for heartburn.  Neurological: Positive for headaches (thinks these are tension headaches).  Psychiatric/Behavioral: Negative for suicidal ideas. The patient is nervous/anxious.       Objective  Filed Vitals:   02/07/15 1036  BP: 110/70  Pulse: 80  Temp: 98.1 F (36.7 C)  TempSrc: Oral  Resp: 16  Height: 5' 7.25" (1.708 m)  Weight: 239 lb (108.41 kg)  SpO2: 94%     Physical Exam  Constitutional: She is well-developed, well-nourished, and in no distress.  HENT:  Head: Normocephalic and atraumatic.  Nose: Right sinus exhibits no maxillary sinus tenderness and no frontal sinus tenderness. Left sinus exhibits no maxillary sinus tenderness and no frontal sinus tenderness.  Mouth/Throat: Oropharynx is clear and moist and mucous membranes are normal.  Cardiovascular: Normal rate and regular rhythm.   Pulmonary/Chest: Effort normal and breath sounds normal. No respiratory distress. She has no wheezes. She has no rales.  Psychiatric: Memory, affect and judgment normal.         Assessment & Plan  1. Panic disorder with agoraphobia Patient has anxiety and panic attacks. Responding well to present therapy. Refills provided in follow-up in 3 months - ALPRAZolam (XANAX) 0.5 MG tablet; Take 1 tablet (0.5 mg total) by mouth at bedtime as needed for anxiety.  Dispense: 30 tablet; Refill: 0  2. Persistent dry cough Persistent dry cough of unknown etiology. We will obtain a chest x-ray and have started patient on short acting beta agonist. - DG Chest 2 View; Future - albuterol (PROVENTIL HFA;VENTOLIN HFA) 108 (90 BASE) MCG/ACT inhaler; Inhale 2 puffs into the lungs every 6 (six) hours as needed for wheezing or shortness of breath.  Dispense: 1 Inhaler; Refill: 0  3. 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 Prescription for EpiPen  provided. - EPINEPHrine 0.3 mg/0.3 mL IJ SOAJ injection; Inject 0.3 mLs (0.3 mg total) into the muscle once.  Dispense: 1 Device; Refill: 0   Marlon Vonruden [facility] 02/07/2015 10:58 AM

## 2015-02-09 NOTE — Telephone Encounter (Signed)
DR [facility] ORDERED MEDS TO [facility]

## 2015-03-16 ENCOUNTER — Ambulatory Visit: Payer: BLUE CROSS/BLUE SHIELD | Admitting: Family Medicine

## 2015-03-19 ENCOUNTER — Ambulatory Visit (INDEPENDENT_AMBULATORY_CARE_PROVIDER_SITE_OTHER): Payer: BLUE CROSS/BLUE SHIELD | Admitting: Family Medicine

## 2015-03-19 ENCOUNTER — Encounter: Payer: Self-pay | Admitting: Family Medicine

## 2015-03-19 VITALS — BP 112/69 | HR 82 | Temp 98.1°F | Resp 18 | Ht 67.0 in | Wt 235.0 lb

## 2015-03-19 DIAGNOSIS — E785 Hyperlipidemia, unspecified: Secondary | ICD-10-CM | POA: Diagnosis not present

## 2015-03-19 DIAGNOSIS — M6248 Contracture of muscle, other site: Secondary | ICD-10-CM | POA: Diagnosis not present

## 2015-03-19 DIAGNOSIS — R7309 Other abnormal glucose: Secondary | ICD-10-CM

## 2015-03-19 DIAGNOSIS — F410 Panic disorder [episodic paroxysmal anxiety] without agoraphobia: Secondary | ICD-10-CM | POA: Diagnosis not present

## 2015-03-19 DIAGNOSIS — M62838 Other muscle spasm: Secondary | ICD-10-CM

## 2015-03-19 MED ORDER — ALPRAZOLAM 0.5 MG PO TABS: 0.5000 mg | ORAL_TABLET | Freq: Every evening | ORAL | Status: DC | PRN

## 2015-03-19 NOTE — Progress Notes (Signed)
Name: Robin Bryan   MRN: [id number]    DOB: 04/28/1951   Date:03/19/2015       Progress Note  Subjective  Chief Complaint  Chief Complaint  Patient presents with  . Neck Pain    Rt Side    Neck Pain  This is a recurrent problem. The current episode started in the past 7 days (three days ago). The problem has been resolved. The pain is at a severity of 0/10. The patient is experiencing no pain. Associated symptoms include chest pain. Pertinent negatives include no headaches, leg pain or visual change. She has tried NSAIDs (took Ibuprofen 800mg  which helped a great deal) for the symptoms.  Anxiety Presents for follow-up visit. Symptoms include chest pain, dizziness, irritability, nervous/anxious behavior, panic and shortness of breath. Patient reports no insomnia. The severity of symptoms is severe. The quality of sleep is poor.   Past treatments include benzodiazephines and SSRIs. The treatment provided significant relief. Compliance with prior treatments has been good.  Hyperlipidemia This is a chronic problem. The problem is controlled. Recent lipid tests were reviewed and are normal. Associated symptoms include chest pain and shortness of breath. Pertinent negatives include no leg pain or myalgias. Current antihyperlipidemic treatment includes statins. The current treatment provides moderate improvement of lipids. There are no compliance problems.       Past Medical History  Diagnosis Date  . GERD (gastroesophageal reflux disease)   . Esophageal stricture   . Barrett's esophagus   . Hx of adenomatous colonic polyps   . Diverticulosis   . Anxiety   . Hyperlipidemia   . Hypertension   . Stroke   . Bronchitis   . Intramural leiomyoma of uterus   . Depression   . Myocardial infarction   . Fibromyalgia     Past Surgical History  Procedure Laterality Date  . Abdominal hysterectomy    . Tubal ligation    . Incontinence surgery      Family History  Problem Relation Age of  Onset  . Ovarian cancer Sister   . Cancer Sister   . Diabetes Sister   . Heart disease Sister   . Lung cancer Sister   . Colon polyps Mother   . Diabetes Mother   . Parkinson's disease Cousin   . Cancer Father     lung  . Diabetes Daughter     History   Social History  . Marital Status: Married    Spouse Name: N/A  . Number of Children: N/A  . Years of Education: N/A   Occupational History  . Not on file.   Social History Main Topics  . Smoking status: Former Smoker -- 0.50 packs/day for 8 years    Types: Cigarettes    Quit date: 05/27/2011  . Smokeless tobacco: Never Used  . Alcohol Use: Yes     Comment: social , not everyday  . Drug Use: No  . Sexual Activity: Not on file   Other Topics Concern  . Not on file   Social History Narrative     Current outpatient prescriptions:  .  albuterol (PROVENTIL HFA;VENTOLIN HFA) 108 (90 BASE) MCG/ACT inhaler, Inhale 2 puffs into the lungs every 6 (six) hours as needed for wheezing or shortness of breath., Disp: 1 Inhaler, Rfl: 0 .  ALPRAZolam (XANAX) 0.5 MG tablet, Take 1 tablet (0.5 mg total) by mouth at bedtime as needed for anxiety., Disp: 30 tablet, Rfl: 0 .  clopidogrel (PLAVIX) 75 MG tablet, Take 75 mg  by mouth daily., Disp: , Rfl:  .  donepezil (ARICEPT) 10 MG tablet, , Disp: , Rfl: 5 .  EPINEPHrine 0.3 mg/0.3 mL IJ SOAJ injection, Inject 0.3 mLs (0.3 mg total) into the muscle once., Disp: 1 Device, Rfl: 0 .  esomeprazole (NEXIUM) 40 MG capsule, Take 1 capsule (40 mg total) by mouth 2 (two) times daily., Disp: 180 capsule, Rfl: 3 .  gabapentin (NEURONTIN) 600 MG tablet, Take 600 mg by mouth 3 (three) times daily., Disp: , Rfl:  .  ibuprofen (ADVIL,MOTRIN) 800 MG tablet, Take 1 tablet (800 mg total) by mouth 3 (three) times daily., Disp: 100 tablet, Rfl: 1 .  losartan (COZAAR) 25 MG tablet, Take 25 mg by mouth daily., Disp: , Rfl:  .  MYRBETRIQ 25 MG TB24 tablet, , Disp: , Rfl:  .  pantoprazole (PROTONIX) 40 MG tablet,  Take 1 tablet (40 mg total) by mouth daily., Disp: 60 tablet, Rfl: 11 .  rosuvastatin (CRESTOR) 10 MG tablet, Take 10 mg by mouth daily., Disp: , Rfl:  .  sertraline (ZOLOFT) 100 MG tablet, Take 100 mg by mouth daily., Disp: , Rfl:  .  traMADol (ULTRAM) 50 MG tablet, Take 50 mg by mouth as needed., Disp: , Rfl:   Allergies  Allergen Reactions  . Vicodin [Hydrocodone-Acetaminophen] Nausea Only     Review of Systems  Constitutional: Positive for irritability.  Respiratory: Positive for shortness of breath.   Cardiovascular: Positive for chest pain.  Musculoskeletal: Positive for neck pain. Negative for myalgias.  Neurological: Positive for dizziness. Negative for headaches.  Psychiatric/Behavioral: The patient is nervous/anxious. The patient does not have insomnia.       Objective  Filed Vitals:   03/19/15 1033  BP: 112/69  Pulse: 82  Temp: 98.1 F (36.7 C)  TempSrc: Oral  Resp: 18  Height: 5\' 7"  (1.702 m)  Weight: 235 lb (106.595 kg)  SpO2: 93%    Physical Exam  Constitutional: She is oriented to person, place, and time and well-developed, well-nourished, and in no distress.  HENT:  Head: Normocephalic and atraumatic.  Neck: Normal range of motion. Neck supple.  Cardiovascular: Normal rate and regular rhythm.   Murmur heard.  Systolic murmur is present with a grade of 1/6  Pulmonary/Chest: Effort normal and breath sounds normal.  Abdominal: Soft. Bowel sounds are normal. There is tenderness (generalized soreness on palpation.).  Neurological: She is alert and oriented to person, place, and time.  Skin: Skin is warm and dry.  Psychiatric: Mood, memory, affect and judgment normal.  Nursing note and vitals reviewed.     Assessment & Plan 1. Panic disorder without agoraphobia Continue alprazolam 0.5 mg twice a day when necessary for symptoms of insomnia. Patient understands the dependence potential of benzodiazepines and is taking the medication only as needed. -  ALPRAZolam (XANAX) 0.5 MG tablet; Take 1 tablet (0.5 mg total) by mouth at bedtime as needed for anxiety.  Dispense: 30 tablet; Refill: 2  2. Hyperlipidemia  - Lipid Profile - Comprehensive metabolic panel  3. Elevated hemoglobin A1c  - HgB A1c  4. Neck muscle spasm Neck pain has resolved. Based on history and presentation, it was most likely caused by muscle spasm.   Jereld Presti Asad A. [facility] 03/19/2015 10:56 AM

## 2015-03-21 LAB — COMPREHENSIVE METABOLIC PANEL
A/G RATIO: 2.3 (ref 1.1–2.5)
ALK PHOS: 75 IU/L (ref 39–117)
ALT: 21 IU/L (ref 0–32)
AST: 14 IU/L (ref 0–40)
Albumin: 4.5 g/dL (ref 3.6–4.8)
BILIRUBIN TOTAL: 0.4 mg/dL (ref 0.0–1.2)
BUN/Creatinine Ratio: 32 — ABNORMAL HIGH (ref 11–26)
BUN: 20 mg/dL (ref 8–27)
CHLORIDE: 104 mmol/L (ref 97–108)
CO2: 24 mmol/L (ref 18–29)
CREATININE: 0.63 mg/dL (ref 0.57–1.00)
Calcium: 9.3 mg/dL (ref 8.7–10.3)
GFR calc Af Amer: 111 mL/min/{1.73_m2} (ref >59)
GFR, EST NON AFRICAN AMERICAN: 96 mL/min/{1.73_m2} (ref >59)
Globulin, Total: 2 g/dL (ref 1.5–4.5)
Glucose: 126 mg/dL — ABNORMAL HIGH (ref 65–99)
Potassium: 4.7 mmol/L (ref 3.5–5.2)
SODIUM: 143 mmol/L (ref 134–144)
Total Protein: 6.5 g/dL (ref 6.0–8.5)

## 2015-03-21 LAB — HEMOGLOBIN A1C
Est. average glucose Bld gHb Est-mCnc: 131 mg/dL
Hgb A1c MFr Bld: 6.2 % — ABNORMAL HIGH (ref 4.8–5.6)

## 2015-03-21 LAB — LIPID PANEL
CHOL/HDL RATIO: 3.4 ratio (ref 0.0–4.4)
Cholesterol, Total: 124 mg/dL (ref 100–199)
HDL: 36 mg/dL — ABNORMAL LOW (ref >39)
LDL CALC: 63 mg/dL (ref 0–99)
TRIGLYCERIDES: 126 mg/dL (ref 0–149)
VLDL CHOLESTEROL CAL: 25 mg/dL (ref 5–40)

## 2015-03-23 ENCOUNTER — Telehealth: Payer: Self-pay | Admitting: Family Medicine

## 2015-03-23 NOTE — Telephone Encounter (Signed)
PT WANTS LAB RESULTS

## 2015-03-23 NOTE — Telephone Encounter (Signed)
Returned patient call and gave her test results.

## 2015-03-26 ENCOUNTER — Ambulatory Visit: Payer: BLUE CROSS/BLUE SHIELD | Admitting: Family Medicine

## 2015-04-02 ENCOUNTER — Telehealth: Payer: Self-pay | Admitting: Family Medicine

## 2015-04-02 MED ORDER — LOSARTAN POTASSIUM 100 MG PO TABS: 100.0000 mg | ORAL_TABLET | Freq: Every day | ORAL | Status: DC

## 2015-04-02 NOTE — Telephone Encounter (Signed)
Medication has been refilled and sent to [facility] [address]

## 2015-04-05 ENCOUNTER — Encounter: Payer: Self-pay | Admitting: Internal Medicine

## 2015-04-05 ENCOUNTER — Ambulatory Visit (AMBULATORY_SURGERY_CENTER): Payer: BLUE CROSS/BLUE SHIELD | Admitting: Internal Medicine

## 2015-04-05 VITALS — BP 100/56 | HR 61 | Temp 97.2°F | Resp 16 | Ht 67.0 in | Wt 248.0 lb

## 2015-04-05 DIAGNOSIS — K222 Esophageal obstruction: Secondary | ICD-10-CM

## 2015-04-05 DIAGNOSIS — K219 Gastro-esophageal reflux disease without esophagitis: Secondary | ICD-10-CM

## 2015-04-05 DIAGNOSIS — R1310 Dysphagia, unspecified: Secondary | ICD-10-CM | POA: Diagnosis not present

## 2015-04-05 DIAGNOSIS — K2270 Barrett's esophagus without dysplasia: Secondary | ICD-10-CM

## 2015-04-05 NOTE — Patient Instructions (Signed)
YOU HAD AN ENDOSCOPIC PROCEDURE TODAY AT [facility]:   Refer to the procedure report that was given to you for any specific questions about what was found during the examination.  If the procedure report does not answer your questions, please call your gastroenterologist to clarify.  If you requested that your care partner not be given the details of your procedure findings, then the procedure report has been included in a sealed envelope for you to review at your convenience later.  YOU SHOULD EXPECT: Some feelings of bloating in the abdomen. Passage of more gas than usual.  Walking can help get rid of the air that was put into your GI tract during the procedure and reduce the bloating. If you had a lower endoscopy (such as a colonoscopy or flexible sigmoidoscopy) you may notice spotting of blood in your stool or on the toilet paper. If you underwent a bowel prep for your procedure, you may not have a normal bowel movement for a few days.  Please Note:  You might notice some irritation and congestion in your nose or some drainage.  This is from the oxygen used during your procedure.  There is no need for concern and it should clear up in a day or so.  SYMPTOMS TO REPORT IMMEDIATELY:   Following upper endoscopy (EGD)  Vomiting of blood or coffee ground material  New chest pain or pain under the shoulder blades  Painful or persistently difficult swallowing  New shortness of breath  Fever of 100F or higher  Black, tarry-looking stools  For urgent or emergent issues, a gastroenterologist can be reached at any hour by calling [phone].   DIET: Follow Dilation diet instructions  ACTIVITY:  You should plan to take it easy for the rest of today and you should NOT DRIVE or use heavy machinery until tomorrow (because of the sedation medicines used during the test).    FOLLOW UP: Our staff will call the number listed on your records the next business day following your  procedure to check on you and address any questions or concerns that you may have regarding the information given to you following your procedure. If we do not reach you, we will leave a message.  However, if you are feeling well and you are not experiencing any problems, there is no need to return our call.  We will assume that you have returned to your regular daily activities without incident.  If any biopsies were taken you will be contacted by phone or by letter within the next 1-3 weeks.  Please call us at [phone] if you have not heard about the biopsies in 3 weeks.    SIGNATURES/CONFIDENTIALITY: You and/or your care partner have signed paperwork which will be entered into your electronic medical record.  These signatures attest to the fact that that the information above on your After Visit Summary has been reviewed and is understood.  Full responsibility of the confidentiality of this discharge information lies with you and/or your care-partner.  Clear liquids until 10 am, then soft foods for the rest of the day Continue current medication. Resume Plavix today Repeat EGD in 3 years

## 2015-04-05 NOTE — Op Note (Signed)
[place]  [address] [place], [id number]   ENDOSCOPY PROCEDURE REPORT  PATIENT: Robin, Bryan  MR#: [id number] BIRTHDATE: Jun 03, 1951 , 64  yrs. old GENDER: female ENDOSCOPIST: Eustace Quail, MD REFERRED BY:  .  Self / Office PROCEDURE DATE:  04/05/2015 PROCEDURE:  EGD w/ biopsy and Maloney dilation of esophagus  -54 ASA CLASS:     Class III INDICATIONS:  dyspepsia and history of Barrett's esophagus(last examination March 2013 with nondysplastic Barrett's). MEDICATIONS: Monitored anesthesia care and Propofol 200 mg IV TOPICAL ANESTHETIC: none  DESCRIPTION OF PROCEDURE: After the risks benefits and alternatives of the procedure were thoroughly explained, informed consent was obtained.  The LB [id number] endoscope was introduced through the mouth and advanced to the second portion of the duodenum , Without limitations.  The instrument was slowly withdrawn as the mucosa was fully examined.  EXAM:Esophagus revealed a 1.5 cm segment of Barrett's type mucosa in the most distal portion.  No active inflammation, nodules, or other abnormality.  Multiple biopsies taken.  It was mild benign stricturing at the gastroesophageal junction.  The stomach was normal except for some nonspecific antral erythema.  The duodenal bulb was normal.  Retroflexed views revealed a hiatal hernia. The scope was then withdrawn from the patient and the procedure completed. THERAPY: 54 French Maloney dilator was passed into the esophagus without resistance or heme. Tolerated well  COMPLICATIONS: There were no immediate complications.  ENDOSCOPIC IMPRESSION: 1. GERD with short segment Barrett's esophagus status post biopsies 2. Large-caliber esophageal stricture status post [facility] dilator 54 French  RECOMMENDATIONS: 1.  Clear liquids until 10 AM, then soft foods rest of day.  Resume prior diet tomorrow. 2.  Continue current medications. Resume Plavix today 3.  REPEAT  SURVEILLANCE EGD IN 3 YEARS  , if no dysplasia on biopsies  REPEAT EXAM:  eSigned:  Eustace Quail, MD 04/05/2015 8:23 AM    CC:The Patient  ; Keith Rake, MD

## 2015-04-05 NOTE — Progress Notes (Signed)
Called to room to assist during endoscopic procedure.  Patient ID and intended procedure confirmed with present staff. Received instructions for my participation in the procedure from the performing physician.  

## 2015-04-05 NOTE — Progress Notes (Signed)
Report to PACU, RN, vss, BBS= Clear.  

## 2015-04-06 ENCOUNTER — Telehealth: Payer: Self-pay | Admitting: *Deleted

## 2015-04-06 NOTE — Telephone Encounter (Signed)
  Follow up Call-  Call back number 04/05/2015  Post procedure Call Back phone  # [phone]  Permission to leave phone message Yes     Patient questions:  Message left to call if necessary.

## 2015-04-17 ENCOUNTER — Encounter: Payer: Self-pay | Admitting: Internal Medicine

## 2015-04-28 DIAGNOSIS — F419 Anxiety disorder, unspecified: Secondary | ICD-10-CM | POA: Insufficient documentation

## 2015-04-28 DIAGNOSIS — K219 Gastro-esophageal reflux disease without esophagitis: Secondary | ICD-10-CM | POA: Insufficient documentation

## 2015-04-28 DIAGNOSIS — E7800 Pure hypercholesterolemia, unspecified: Secondary | ICD-10-CM | POA: Insufficient documentation

## 2015-04-28 DIAGNOSIS — I10 Essential (primary) hypertension: Secondary | ICD-10-CM | POA: Insufficient documentation

## 2015-05-02 ENCOUNTER — Encounter: Payer: Self-pay | Admitting: Family Medicine

## 2015-05-02 ENCOUNTER — Ambulatory Visit (INDEPENDENT_AMBULATORY_CARE_PROVIDER_SITE_OTHER): Payer: BLUE CROSS/BLUE SHIELD | Admitting: Family Medicine

## 2015-05-02 DIAGNOSIS — F3341 Major depressive disorder, recurrent, in partial remission: Secondary | ICD-10-CM | POA: Diagnosis not present

## 2015-05-02 MED ORDER — SERTRALINE HCL 100 MG PO TABS: 150.0000 mg | ORAL_TABLET | Freq: Every day | ORAL | Status: DC

## 2015-05-02 NOTE — Progress Notes (Signed)
Name: Robin Bryan   MRN: [id number]    DOB: 1950/12/12   Date:05/02/2015       Progress Note  Subjective  Chief Complaint  Chief Complaint  Patient presents with  . Follow-up    Bad nerves / depression  . Hyperlipidemia  . Anxiety    Depression      The patient presents with depression.  This is a chronic problem.  Associated symptoms include fatigue, hopelessness, decreased interest and myalgias.  Past treatments include SSRIs - Selective serotonin reuptake inhibitors.  Compliance with treatment is good.  Previous treatment provided moderate relief.  Past medical history includes fibromyalgia, anxiety and depression.     Past Medical History  Diagnosis Date  . GERD (gastroesophageal reflux disease)   . Esophageal stricture   . Barrett's esophagus   . Hx of adenomatous colonic polyps   . Diverticulosis   . Anxiety   . Hyperlipidemia   . Hypertension   . Stroke   . Bronchitis   . Intramural leiomyoma of uterus   . Depression   . Myocardial infarction   . Fibromyalgia   . Arthritis   . Asthma     Past Surgical History  Procedure Laterality Date  . Abdominal hysterectomy    . Tubal ligation    . Incontinence surgery      Family History  Problem Relation Age of Onset  . Ovarian cancer Sister   . Cancer Sister   . Diabetes Sister   . Heart disease Sister   . Lung cancer Sister   . Colon polyps Mother   . Diabetes Mother   . Parkinson's disease Cousin   . Cancer Father     lung  . Diabetes Daughter     Social History   Social History  . Marital Status: Married    Spouse Name: N/A  . Number of Children: N/A  . Years of Education: N/A   Occupational History  . Not on file.   Social History Main Topics  . Smoking status: Former Smoker -- 0.50 packs/day for 8 years    Types: Cigarettes    Quit date: 05/27/2011  . Smokeless tobacco: Never Used  . Alcohol Use: Yes     Comment: social , not everyday  . Drug Use: No  . Sexual Activity: Not on file    Other Topics Concern  . Not on file   Social History Narrative     Current outpatient prescriptions:  .  albuterol (PROVENTIL HFA;VENTOLIN HFA) 108 (90 BASE) MCG/ACT inhaler, Inhale 2 puffs into the lungs every 6 (six) hours as needed for wheezing or shortness of breath., Disp: 1 Inhaler, Rfl: 0 .  ALPRAZolam (XANAX) 0.5 MG tablet, Take 1 tablet (0.5 mg total) by mouth at bedtime as needed for anxiety., Disp: 30 tablet, Rfl: 2 .  azithromycin (ZITHROMAX) 250 MG tablet, , Disp: , Rfl: 0 .  clopidogrel (PLAVIX) 75 MG tablet, Take 75 mg by mouth daily., Disp: , Rfl:  .  EPINEPHrine 0.3 mg/0.3 mL IJ SOAJ injection, Inject 0.3 mLs (0.3 mg total) into the muscle once., Disp: 1 Device, Rfl: 0 .  esomeprazole (NEXIUM) 40 MG capsule, Take 1 capsule (40 mg total) by mouth 2 (two) times daily., Disp: 180 capsule, Rfl: 3 .  gabapentin (NEURONTIN) 600 MG tablet, Take 600 mg by mouth 3 (three) times daily., Disp: , Rfl:  .  HYDROcodone-homatropine (HYCODAN) 5-1.5 MG/5ML syrup, , Disp: , Rfl: 0 .  ibuprofen (ADVIL,MOTRIN) 800 MG tablet,  Take 1 tablet (800 mg total) by mouth 3 (three) times daily., Disp: 100 tablet, Rfl: 1 .  losartan (COZAAR) 100 MG tablet, Take 1 tablet (100 mg total) by mouth daily., Disp: 90 tablet, Rfl: 0 .  MYRBETRIQ 25 MG TB24 tablet, , Disp: , Rfl:  .  pantoprazole (PROTONIX) 40 MG tablet, Take 1 tablet (40 mg total) by mouth daily., Disp: 60 tablet, Rfl: 11 .  predniSONE (DELTASONE) 20 MG tablet, , Disp: , Rfl: 0 .  rosuvastatin (CRESTOR) 10 MG tablet, Take 10 mg by mouth daily., Disp: , Rfl:  .  sertraline (ZOLOFT) 100 MG tablet, Take 100 mg by mouth daily., Disp: , Rfl:   Allergies  Allergen Reactions  . Vicodin [Hydrocodone-Acetaminophen] Nausea Only     Review of Systems  Constitutional: Positive for fatigue.  Musculoskeletal: Positive for myalgias.  Psychiatric/Behavioral: Positive for depression.   Objective  Filed Vitals:   05/02/15 1338  BP: 118/80   Pulse: 80  Temp: 97.8 F (36.6 C)  TempSrc: Oral  Resp: 18  Height: 5\' 9"  (1.753 m)  Weight: 236 lb 3.2 oz (107.14 kg)  SpO2: 94%    Physical Exam  Constitutional: She is oriented to person, place, and time and well-developed, well-nourished, and in no distress.  Neurological: She is alert and oriented to person, place, and time.  Psychiatric: Affect and judgment normal.  Nursing note and vitals reviewed.  Assessment & Plan  1. Major depressive disorder, recurrent episode, in partial remission Increased Zoloft to 150 mg daily for optimal treatment of symptoms of depression. Follow-up in one month. - sertraline (ZOLOFT) 100 MG tablet; Take 1.5 tablets (150 mg total) by mouth daily.  Dispense: 45 tablet; Refill: 2   Robin Bryan Asad A. [facility] 05/02/2015 1:48 PM

## 2015-05-04 ENCOUNTER — Telehealth: Payer: Self-pay | Admitting: Family Medicine

## 2015-05-04 NOTE — Telephone Encounter (Signed)
PHARM REQUESTING REFILL ON CRESTOR 20 MG AND NEEDS 90 DAY SUPPLY AND THE OTHER CLOPIDOGREL 75MG  90 DAY SUPPLY. [place]

## 2015-05-09 MED ORDER — CLOPIDOGREL BISULFATE 75 MG PO TABS: 75.0000 mg | ORAL_TABLET | Freq: Every day | ORAL | Status: DC

## 2015-05-09 MED ORDER — ROSUVASTATIN CALCIUM 10 MG PO TABS: 10.0000 mg | ORAL_TABLET | Freq: Every day | ORAL | Status: DC

## 2015-05-09 NOTE — Telephone Encounter (Signed)
Crestor 20 mg and clopidegrel 75 mg has been refilled and sent to [facility] [address]

## 2015-06-13 ENCOUNTER — Ambulatory Visit (INDEPENDENT_AMBULATORY_CARE_PROVIDER_SITE_OTHER): Payer: BLUE CROSS/BLUE SHIELD | Admitting: Family Medicine

## 2015-06-13 ENCOUNTER — Encounter: Payer: Self-pay | Admitting: Family Medicine

## 2015-06-13 VITALS — BP 118/76 | HR 89 | Temp 98.5°F | Resp 18 | Ht 69.0 in | Wt 231.2 lb

## 2015-06-13 DIAGNOSIS — F3341 Major depressive disorder, recurrent, in partial remission: Secondary | ICD-10-CM

## 2015-06-13 NOTE — Progress Notes (Signed)
Name: Robin Bryan   MRN: [id number]    DOB: [birth year]   Date:06/13/2015       Progress Note  Subjective  Chief Complaint  Chief Complaint  Patient presents with  . Follow-up    6 wk  . Hyperlipidemia  . Depression  . Anxiety   Depression      The patient presents with depression.  This is a chronic problem.  The problem has been gradually improving since onset.  Associated symptoms include decreased concentration, fatigue, hopelessness, insomnia and body aches.  Past treatments include SSRIs - Selective serotonin reuptake inhibitors.  Previous treatment provided moderate relief.  Past medical history includes chronic pain, fibromyalgia, anxiety and depression.     Past Medical History  Diagnosis Date  . GERD (gastroesophageal reflux disease)   . Esophageal stricture   . Barrett's esophagus   . Hx of adenomatous colonic polyps   . Diverticulosis   . Anxiety   . Hyperlipidemia   . Hypertension   . Stroke ([place])   . Bronchitis   . Intramural leiomyoma of uterus   . Depression   . Myocardial infarction ([place])   . Fibromyalgia   . Arthritis   . Asthma     Past Surgical History  Procedure Laterality Date  . Abdominal hysterectomy    . Tubal ligation    . Incontinence surgery      Family History  Problem Relation Age of Onset  . Ovarian cancer Sister   . Cancer Sister   . Diabetes Sister   . Heart disease Sister   . Lung cancer Sister   . Colon polyps Mother   . Diabetes Mother   . Parkinson's disease Cousin   . Cancer Father     lung  . Diabetes Daughter     Social History   Social History  . Marital Status: Married    Spouse Name: N/A  . Number of Children: N/A  . Years of Education: N/A   Occupational History  . Not on file.   Social History Main Topics  . Smoking status: Former Smoker -- 0.50 packs/day for 8 years    Types: Cigarettes    Quit date: 05/27/2011  . Smokeless tobacco: Never Used  . Alcohol Use: Yes     Comment: social , not  everyday  . Drug Use: No  . Sexual Activity: Not on file   Other Topics Concern  . Not on file   Social History Narrative    Current outpatient prescriptions:  .  albuterol (PROVENTIL HFA;VENTOLIN HFA) 108 (90 BASE) MCG/ACT inhaler, Inhale 2 puffs into the lungs every 6 (six) hours as needed for wheezing or shortness of breath., Disp: 1 Inhaler, Rfl: 0 .  ALPRAZolam (XANAX) 0.5 MG tablet, Take 1 tablet (0.5 mg total) by mouth at bedtime as needed for anxiety., Disp: 30 tablet, Rfl: 2 .  clopidogrel (PLAVIX) 75 MG tablet, Take 1 tablet (75 mg total) by mouth daily., Disp: 30 tablet, Rfl: 2 .  EPINEPHrine 0.3 mg/0.3 mL IJ SOAJ injection, Inject 0.3 mLs (0.3 mg total) into the muscle once., Disp: 1 Device, Rfl: 0 .  esomeprazole (NEXIUM) 40 MG capsule, Take 1 capsule (40 mg total) by mouth 2 (two) times daily., Disp: 180 capsule, Rfl: 3 .  gabapentin (NEURONTIN) 600 MG tablet, Take 600 mg by mouth 3 (three) times daily., Disp: , Rfl:  .  ibuprofen (ADVIL,MOTRIN) 800 MG tablet, Take 1 tablet (800 mg total) by mouth 3 (three) times daily.,  Disp: 100 tablet, Rfl: 1 .  losartan (COZAAR) 100 MG tablet, Take 1 tablet (100 mg total) by mouth daily., Disp: 90 tablet, Rfl: 0 .  MYRBETRIQ 25 MG TB24 tablet, , Disp: , Rfl:  .  pantoprazole (PROTONIX) 40 MG tablet, Take 1 tablet (40 mg total) by mouth daily., Disp: 60 tablet, Rfl: 11 .  rosuvastatin (CRESTOR) 10 MG tablet, Take 1 tablet (10 mg total) by mouth daily., Disp: 30 tablet, Rfl: 2 .  sertraline (ZOLOFT) 100 MG tablet, Take 1.5 tablets (150 mg total) by mouth daily., Disp: 45 tablet, Rfl: 2  Allergies  Allergen Reactions  . Vicodin [Hydrocodone-Acetaminophen] Nausea Only    Review of Systems  Constitutional: Positive for fatigue.  Psychiatric/Behavioral: Positive for depression and decreased concentration. The patient has insomnia.    Objective  Filed Vitals:   06/13/15 1212  BP: 118/76  Pulse: 89  Temp: 98.5 F (36.9 C)  TempSrc:  Oral  Resp: 18  Height: 5\' 9"  (1.753 m)  Weight: 231 lb 3.2 oz (104.872 kg)  SpO2: 96%    Physical Exam  Constitutional: She is oriented to person, place, and time and well-developed, well-nourished, and in no distress.  HENT:  Head: Normocephalic and atraumatic.  Cardiovascular: Normal rate and regular rhythm.   Pulmonary/Chest: Effort normal and breath sounds normal.  Neurological: She is alert and oriented to person, place, and time.  Psychiatric: Mood, memory, affect and judgment normal.  Nursing note and vitals reviewed.  Assessment & Plan  1. Recurrent major depressive disorder, in partial remission (HCC) Symptoms of depression are stable and relatively controlled on Zoloft 150 mg daily. We discussed fibromyalgia and possible therapies including Savella, which may help both her mood and pain symptoms. We will follow-up in one month.   Abdulhadi Stopa Asad A. [facility] 06/13/2015 12:21 PM

## 2015-06-29 ENCOUNTER — Telehealth: Payer: Self-pay | Admitting: Family Medicine

## 2015-06-29 ENCOUNTER — Ambulatory Visit
Admission: RE | Admit: 2015-06-29 | Discharge: 2015-06-29 | Disposition: A | Discharge status: Home / Self Care | Payer: BLUE CROSS/BLUE SHIELD | Source: Ambulatory Visit | Attending: Family Medicine | Admitting: Family Medicine

## 2015-06-29 ENCOUNTER — Encounter: Payer: Self-pay | Admitting: Family Medicine

## 2015-06-29 ENCOUNTER — Other Ambulatory Visit: Payer: Self-pay | Admitting: Family Medicine

## 2015-06-29 ENCOUNTER — Ambulatory Visit (INDEPENDENT_AMBULATORY_CARE_PROVIDER_SITE_OTHER): Payer: BLUE CROSS/BLUE SHIELD | Admitting: Family Medicine

## 2015-06-29 VITALS — BP 124/78 | HR 88 | Temp 97.6°F | Resp 18 | Ht 69.0 in | Wt 224.0 lb

## 2015-06-29 DIAGNOSIS — R222 Localized swelling, mass and lump, trunk: Secondary | ICD-10-CM | POA: Insufficient documentation

## 2015-06-29 DIAGNOSIS — D179 Benign lipomatous neoplasm, unspecified: Secondary | ICD-10-CM | POA: Insufficient documentation

## 2015-06-29 NOTE — Progress Notes (Signed)
Name: Robin Bryan   MRN: [id number]    DOB: Oct 25, 1950   Date:06/29/2015       Progress Note  Subjective  Chief Complaint  Chief Complaint  Patient presents with  . Cyst    on left side of upper body    HPI  Pt. Is here for a mass over the left  Posterior side of her chest wall. It feels round, non-painful except on firm touching, first noticed it 2 weeks ago. It has not grown in size or otherwise changed in character.  Past Medical History  Diagnosis Date  . GERD (gastroesophageal reflux disease)   . Esophageal stricture   . Barrett's esophagus   . Hx of adenomatous colonic polyps   . Diverticulosis   . Anxiety   . Hyperlipidemia   . Hypertension   . Stroke ([place])   . Bronchitis   . Intramural leiomyoma of uterus   . Depression   . Myocardial infarction ([place])   . Fibromyalgia   . Arthritis   . Asthma     Past Surgical History  Procedure Laterality Date  . Abdominal hysterectomy    . Tubal ligation    . Incontinence surgery      Family History  Problem Relation Age of Onset  . Ovarian cancer Sister   . Cancer Sister   . Diabetes Sister   . Heart disease Sister   . Lung cancer Sister   . Colon polyps Mother   . Diabetes Mother   . Parkinson's disease Cousin   . Cancer Father     lung  . Diabetes Daughter     Social History   Social History  . Marital Status: Married    Spouse Name: N/A  . Number of Children: N/A  . Years of Education: N/A   Occupational History  . Not on file.   Social History Main Topics  . Smoking status: Former Smoker -- 0.50 packs/day for 8 years    Types: Cigarettes    Quit date: 05/27/2011  . Smokeless tobacco: Never Used  . Alcohol Use: Yes     Comment: social , not everyday  . Drug Use: No  . Sexual Activity: Not on file   Other Topics Concern  . Not on file   Social History Narrative     Current outpatient prescriptions:  .  albuterol (PROVENTIL HFA;VENTOLIN HFA) 108 (90 BASE) MCG/ACT inhaler, Inhale 2  puffs into the lungs every 6 (six) hours as needed for wheezing or shortness of breath., Disp: 1 Inhaler, Rfl: 0 .  ALPRAZolam (XANAX) 0.5 MG tablet, Take 1 tablet (0.5 mg total) by mouth at bedtime as needed for anxiety., Disp: 30 tablet, Rfl: 2 .  clopidogrel (PLAVIX) 75 MG tablet, Take 1 tablet (75 mg total) by mouth daily., Disp: 30 tablet, Rfl: 2 .  EPINEPHrine 0.3 mg/0.3 mL IJ SOAJ injection, Inject 0.3 mLs (0.3 mg total) into the muscle once., Disp: 1 Device, Rfl: 0 .  esomeprazole (NEXIUM) 40 MG capsule, Take 1 capsule (40 mg total) by mouth 2 (two) times daily., Disp: 180 capsule, Rfl: 3 .  gabapentin (NEURONTIN) 600 MG tablet, Take 600 mg by mouth 3 (three) times daily., Disp: , Rfl:  .  ibuprofen (ADVIL,MOTRIN) 800 MG tablet, Take 1 tablet (800 mg total) by mouth 3 (three) times daily., Disp: 100 tablet, Rfl: 1 .  losartan (COZAAR) 100 MG tablet, Take 1 tablet (100 mg total) by mouth daily., Disp: 90 tablet, Rfl: 0 .  MYRBETRIQ 25 MG TB24 tablet, , Disp: , Rfl:  .  oxyCODONE-acetaminophen (PERCOCET/ROXICET) 5-325 MG tablet, , Disp: , Rfl: 0 .  pantoprazole (PROTONIX) 40 MG tablet, Take 1 tablet (40 mg total) by mouth daily., Disp: 60 tablet, Rfl: 11 .  rosuvastatin (CRESTOR) 10 MG tablet, Take 1 tablet (10 mg total) by mouth daily., Disp: 30 tablet, Rfl: 2 .  sertraline (ZOLOFT) 100 MG tablet, Take 1.5 tablets (150 mg total) by mouth daily., Disp: 45 tablet, Rfl: 2  Allergies  Allergen Reactions  . Vicodin [Hydrocodone-Acetaminophen] Nausea Only     Review of Systems  Constitutional: Negative for fever, chills, weight loss and malaise/fatigue.    Objective  Filed Vitals:   06/29/15 1051  BP: 124/78  Pulse: 88  Temp: 97.6 F (36.4 C)  Resp: 18  Height: 5\' 9"  (1.753 m)  Weight: 224 lb (101.606 kg)  SpO2: 94%    Physical Exam  Pulmonary/Chest: She exhibits mass.    Cystic, mobile, subcutaneous, mildly tender to palpation mass on the left lateral to posterior chest  wall. Location of the mass was marked with Sharpie.  Nursing note and vitals reviewed.   Assessment & Plan  1. Mass of chest wall, left Subcutaneous mobile, non-tender mass. Obtain ultrasound for evaluation. - [place] Chest   Trent Theisen [facility] 06/29/2015 11:02 AM

## 2015-06-29 NOTE — Telephone Encounter (Signed)
Since this order was STAT they had to make a verbal report to on call or ordering provider. Soft tissue chest wall ultrasound of mass reported to be most likely a lipoma however additional imaging may be done if you want further investigation. See official report for details. Patient informed it was most likely a lipoma but you would call her next week and determine if you want to do further testing.

## 2015-07-06 ENCOUNTER — Other Ambulatory Visit: Payer: Self-pay

## 2015-07-06 DIAGNOSIS — Z1231 Encounter for screening mammogram for malignant neoplasm of breast: Secondary | ICD-10-CM

## 2015-07-09 ENCOUNTER — Telehealth: Payer: Self-pay | Admitting: Family Medicine

## 2015-07-09 NOTE — Telephone Encounter (Signed)
Requesting a return call pertaining to her CT referral. I informed her that she had an appointment scheduled for 07-12-15 but she would like to get it done before then because she is leaving town. [phone]

## 2015-07-10 NOTE — Telephone Encounter (Signed)
Spoke with pt and she is ok with her appt, she has follow-appt here with Dr. Manuella Ghazi on friday

## 2015-07-12 ENCOUNTER — Ambulatory Visit
Admission: RE | Admit: 2015-07-12 | Discharge: 2015-07-12 | Disposition: A | Discharge status: Home / Self Care | Payer: BLUE CROSS/BLUE SHIELD | Source: Ambulatory Visit | Attending: Family Medicine | Admitting: Family Medicine

## 2015-07-12 DIAGNOSIS — I313 Pericardial effusion (noninflammatory): Secondary | ICD-10-CM | POA: Insufficient documentation

## 2015-07-12 DIAGNOSIS — D179 Benign lipomatous neoplasm, unspecified: Secondary | ICD-10-CM | POA: Diagnosis not present

## 2015-07-12 DIAGNOSIS — R222 Localized swelling, mass and lump, trunk: Secondary | ICD-10-CM

## 2015-07-13 ENCOUNTER — Ambulatory Visit (INDEPENDENT_AMBULATORY_CARE_PROVIDER_SITE_OTHER): Payer: BLUE CROSS/BLUE SHIELD | Admitting: Family Medicine

## 2015-07-13 ENCOUNTER — Encounter: Payer: Self-pay | Admitting: Family Medicine

## 2015-07-13 VITALS — BP 118/68 | HR 83 | Temp 98.3°F | Resp 18 | Ht 69.0 in | Wt 224.6 lb

## 2015-07-13 DIAGNOSIS — I313 Pericardial effusion (noninflammatory): Secondary | ICD-10-CM

## 2015-07-13 DIAGNOSIS — Z23 Encounter for immunization: Secondary | ICD-10-CM

## 2015-07-13 DIAGNOSIS — D179 Benign lipomatous neoplasm, unspecified: Secondary | ICD-10-CM | POA: Diagnosis not present

## 2015-07-13 DIAGNOSIS — I319 Disease of pericardium, unspecified: Secondary | ICD-10-CM | POA: Diagnosis not present

## 2015-07-13 DIAGNOSIS — I3139 Other pericardial effusion (noninflammatory): Secondary | ICD-10-CM

## 2015-07-13 NOTE — Progress Notes (Signed)
Name: Robin Bryan   MRN: [id number]    DOB: 1950/12/02   Date:07/13/2015       Progress Note  Subjective  Chief Complaint  Chief Complaint  Patient presents with  . Lipoma    pt here for 1 month follow up    HPI  Pt. Is here for follow up of Chest CT. She presented with a soft mobile, non tender mass on her left posterior chest wall. Ultrasound revealed a possible lipoma and a CT scan was orderd for confirmation. CT Scan did confirm the diagnosis. Pt. Will be referred to General Surgery. CT Scan also showed a small pericardial effusion. Pt. Had one episode of left sided chest pain last week, which resolved after a few days. No symptoms today.  Past Medical History  Diagnosis Date  . GERD (gastroesophageal reflux disease)   . Esophageal stricture   . Barrett's esophagus   . Hx of adenomatous colonic polyps   . Diverticulosis   . Anxiety   . Hyperlipidemia   . Hypertension   . Stroke ([place])   . Bronchitis   . Intramural leiomyoma of uterus   . Depression   . Myocardial infarction ([place])   . Fibromyalgia   . Arthritis   . Asthma     Past Surgical History  Procedure Laterality Date  . Abdominal hysterectomy    . Tubal ligation    . Incontinence surgery      Family History  Problem Relation Age of Onset  . Ovarian cancer Sister   . Cancer Sister   . Diabetes Sister   . Heart disease Sister   . Lung cancer Sister   . Colon polyps Mother   . Diabetes Mother   . Parkinson's disease Cousin   . Cancer Father     lung  . Diabetes Daughter     Social History   Social History  . Marital Status: Married    Spouse Name: N/A  . Number of Children: N/A  . Years of Education: N/A   Occupational History  . Not on file.   Social History Main Topics  . Smoking status: Former Smoker -- 0.50 packs/day for 8 years    Types: Cigarettes    Quit date: 05/27/2011  . Smokeless tobacco: Never Used  . Alcohol Use: Yes     Comment: social , not everyday  . Drug Use: No   . Sexual Activity: Not on file   Other Topics Concern  . Not on file   Social History Narrative     Current outpatient prescriptions:  .  albuterol (PROVENTIL HFA;VENTOLIN HFA) 108 (90 BASE) MCG/ACT inhaler, Inhale 2 puffs into the lungs every 6 (six) hours as needed for wheezing or shortness of breath., Disp: 1 Inhaler, Rfl: 0 .  ALPRAZolam (XANAX) 0.5 MG tablet, Take 1 tablet (0.5 mg total) by mouth at bedtime as needed for anxiety., Disp: 30 tablet, Rfl: 2 .  clopidogrel (PLAVIX) 75 MG tablet, Take 1 tablet (75 mg total) by mouth daily., Disp: 30 tablet, Rfl: 2 .  EPINEPHrine 0.3 mg/0.3 mL IJ SOAJ injection, Inject 0.3 mLs (0.3 mg total) into the muscle once., Disp: 1 Device, Rfl: 0 .  esomeprazole (NEXIUM) 40 MG capsule, Take 1 capsule (40 mg total) by mouth 2 (two) times daily., Disp: 180 capsule, Rfl: 3 .  gabapentin (NEURONTIN) 600 MG tablet, Take 600 mg by mouth 3 (three) times daily., Disp: , Rfl:  .  ibuprofen (ADVIL,MOTRIN) 800 MG tablet, Take 1  tablet (800 mg total) by mouth 3 (three) times daily., Disp: 100 tablet, Rfl: 1 .  losartan (COZAAR) 100 MG tablet, Take 1 tablet (100 mg total) by mouth daily., Disp: 90 tablet, Rfl: 0 .  MYRBETRIQ 25 MG TB24 tablet, , Disp: , Rfl:  .  oxyCODONE-acetaminophen (PERCOCET/ROXICET) 5-325 MG tablet, , Disp: , Rfl: 0 .  pantoprazole (PROTONIX) 40 MG tablet, Take 1 tablet (40 mg total) by mouth daily., Disp: 60 tablet, Rfl: 11 .  rosuvastatin (CRESTOR) 10 MG tablet, Take 1 tablet (10 mg total) by mouth daily., Disp: 30 tablet, Rfl: 2 .  sertraline (ZOLOFT) 100 MG tablet, Take 1.5 tablets (150 mg total) by mouth daily., Disp: 45 tablet, Rfl: 2  Allergies  Allergen Reactions  . Vicodin [Hydrocodone-Acetaminophen] Nausea Only     Review of Systems  Constitutional: Negative for fever and chills.  Respiratory: Negative for cough and shortness of breath.   Cardiovascular: Positive for chest pain.    Objective  Filed Vitals:   07/13/15  1134  BP: 118/68  Pulse: 83  Temp: 98.3 F (36.8 C)  Resp: 18  Height: 5\' 9"  (1.753 m)  Weight: 224 lb 9 oz (101.861 kg)  SpO2: 96%    Physical Exam  Constitutional: She is oriented to person, place, and time and well-developed, well-nourished, and in no distress.  HENT:  Head: Normocephalic and atraumatic.  Cardiovascular: Normal rate, regular rhythm, S1 normal and S2 normal.   Murmur heard.  Systolic murmur is present with a grade of 2/6  Pulmonary/Chest: Effort normal and breath sounds normal. She has no wheezes.  Neurological: She is alert and oriented to person, place, and time.  Nursing note and vitals reviewed.  Assessment & Plan  1. Need for influenza vaccination  - Flu Vaccine QUAD 36+ mos PF IM (Fluarix & Fluzone Quad PF)  2. Pericardial effusion New finding on CT scan of chest. Asymptomatic. Referral to cardiology. - Ambulatory referral to Cardiology  3. 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 Referral to Gen. surgery for excision. - Ambulatory referral to General Surgery   Junius Faucett [facility] 07/13/2015 12:10 PM

## 2015-07-16 ENCOUNTER — Encounter: Payer: Self-pay | Admitting: *Deleted

## 2015-07-16 DIAGNOSIS — R945 Abnormal results of liver function studies: Secondary | ICD-10-CM | POA: Insufficient documentation

## 2015-07-16 DIAGNOSIS — G939 Disorder of brain, unspecified: Secondary | ICD-10-CM | POA: Insufficient documentation

## 2015-07-16 DIAGNOSIS — K219 Gastro-esophageal reflux disease without esophagitis: Secondary | ICD-10-CM | POA: Insufficient documentation

## 2015-07-16 DIAGNOSIS — R079 Chest pain, unspecified: Secondary | ICD-10-CM | POA: Insufficient documentation

## 2015-07-16 DIAGNOSIS — E785 Hyperlipidemia, unspecified: Secondary | ICD-10-CM | POA: Insufficient documentation

## 2015-07-16 DIAGNOSIS — M79673 Pain in unspecified foot: Secondary | ICD-10-CM | POA: Insufficient documentation

## 2015-07-16 DIAGNOSIS — R7989 Other specified abnormal findings of blood chemistry: Secondary | ICD-10-CM | POA: Insufficient documentation

## 2015-07-16 DIAGNOSIS — I10 Essential (primary) hypertension: Secondary | ICD-10-CM | POA: Insufficient documentation

## 2015-07-16 DIAGNOSIS — R739 Hyperglycemia, unspecified: Secondary | ICD-10-CM | POA: Insufficient documentation

## 2015-07-16 DIAGNOSIS — F4320 Adjustment disorder, unspecified: Secondary | ICD-10-CM | POA: Insufficient documentation

## 2015-07-16 DIAGNOSIS — R252 Cramp and spasm: Secondary | ICD-10-CM | POA: Insufficient documentation

## 2015-07-16 DIAGNOSIS — J40 Bronchitis, not specified as acute or chronic: Secondary | ICD-10-CM | POA: Insufficient documentation

## 2015-07-16 DIAGNOSIS — I6782 Cerebral ischemia: Secondary | ICD-10-CM | POA: Insufficient documentation

## 2015-07-17 ENCOUNTER — Ambulatory Visit (INDEPENDENT_AMBULATORY_CARE_PROVIDER_SITE_OTHER): Payer: BLUE CROSS/BLUE SHIELD | Admitting: General Surgery

## 2015-07-17 ENCOUNTER — Encounter: Payer: Self-pay | Admitting: General Surgery

## 2015-07-17 VITALS — BP 119/73 | HR 71 | Temp 97.5°F | Ht 68.0 in | Wt 222.0 lb

## 2015-07-17 DIAGNOSIS — D179 Benign lipomatous neoplasm, unspecified: Secondary | ICD-10-CM

## 2015-07-17 NOTE — Progress Notes (Signed)
Patient ID: Robin Bryan, female   DOB: [birth year], 63 y.o.   MRN: [id number]  CC: LIPOMA  HPI Robin Bryan is a 64 y.o. female presents to clinic for evaluation of a left lateral chest soft tissue mass consistent with a lipoma. Patient states that it was found on recent physical and recommended she have surgical consultation. She states that it does not really bother her and she only really notices it when palpated and looked for. She doesn't think is Larger since it was found that she has recently lost some weight which makes it more palpable. It has never been red or infected she's never had anything done for this before. She denies any fevers, chills, nausea, vomiting, diarrhea, constipation. She is currently being worked up and has referral pending cardiology for a pericardial effusion as well as she is currently on Plavix for history of multiple mini strokes. No acute complaints without any of these today.  HPI  Past Medical History  Diagnosis Date  . GERD (gastroesophageal reflux disease)   . Esophageal stricture   . Barrett's esophagus   . Hx of adenomatous colonic polyps   . Diverticulosis   . Anxiety   . Hyperlipidemia   . Hypertension   . Stroke ([place])   . Bronchitis   . Intramural leiomyoma of uterus   . Depression   . Myocardial infarction ([place])   . Fibromyalgia   . Arthritis   . Asthma     Past Surgical History  Procedure Laterality Date  . Abdominal hysterectomy    . Tubal ligation    . Incontinence surgery      Family History  Problem Relation Age of Onset  . Ovarian cancer Sister   . Cancer Sister   . Diabetes Sister   . Heart disease Sister   . Lung cancer Sister   . Colon polyps Mother   . Diabetes Mother   . Parkinson's disease Cousin   . Cancer Father     lung  . Diabetes Daughter     Social History Social History  Substance Use Topics  . Smoking status: Former Smoker -- 0.50 packs/day for 8 years    Types: Cigarettes    Quit date:  05/27/2011  . Smokeless tobacco: Never Used  . Alcohol Use: Yes     Comment: social , not everyday    Allergies  Allergen Reactions  . Vicodin [Hydrocodone-Acetaminophen] Nausea Only    Current Outpatient Prescriptions  Medication Sig Dispense Refill  . albuterol (PROVENTIL HFA;VENTOLIN HFA) 108 (90 BASE) MCG/ACT inhaler Inhale 2 puffs into the lungs every 6 (six) hours as needed for wheezing or shortness of breath. 1 Inhaler 0  . ALPRAZolam (XANAX) 0.5 MG tablet Take 1 tablet (0.5 mg total) by mouth at bedtime as needed for anxiety. 30 tablet 2  . clopidogrel (PLAVIX) 75 MG tablet Take 1 tablet (75 mg total) by mouth daily. 30 tablet 2  . EPINEPHrine 0.3 mg/0.3 mL IJ SOAJ injection Inject 0.3 mLs (0.3 mg total) into the muscle once. 1 Device 0  . gabapentin (NEURONTIN) 600 MG tablet Take 600 mg by mouth 3 (three) times daily.    Marland Kitchen losartan (COZAAR) 100 MG tablet Take 1 tablet (100 mg total) by mouth daily. 90 tablet 0  . MYRBETRIQ 25 MG TB24 tablet     . oxyCODONE-acetaminophen (PERCOCET/ROXICET) 5-325 MG tablet 1 tablet 1 day or 1 dose.   0  . pantoprazole (PROTONIX) 40 MG tablet Take 1 tablet (40  mg total) by mouth daily. 60 tablet 11  . rosuvastatin (CRESTOR) 10 MG tablet Take 1 tablet (10 mg total) by mouth daily. 30 tablet 2  . sertraline (ZOLOFT) 100 MG tablet Take 1.5 tablets (150 mg total) by mouth daily. 45 tablet 2   No current facility-administered medications for this visit.     Review of Systems A multipoint review of systems was completed. All pertinent positives and negatives within the history of present illness remainder were negative.  Physical Exam Blood pressure 119/73, pulse 71, temperature 97.5 F (36.4 C), temperature source Oral, height 5\' 8"  (1.727 m), weight 100.699 kg (222 lb). CONSTITUTIONAL: No acute distress. EYES: Pupils are equal, round, and reactive to light, Sclera are non-icteric. EARS, NOSE, MOUTH AND THROAT: The oropharynx is clear. The oral  mucosa is pink and moist. Hearing is intact to voice. LYMPH NODES:  Lymph nodes in the neck are normal. RESPIRATORY:  Lungs are clear. There is normal respiratory effort, with equal breath sounds bilaterally, and without pathologic use of accessory muscles. CARDIOVASCULAR: Heart is regular without murmurs, gallops, or rubs. GI: The abdomen is  soft, nontender, and nondistended. There are no palpable masses. There is no hepatosplenomegaly. There are normal bowel sounds in all quadrants. GU: Rectal deferred.   MUSCULOSKELETAL: Normal muscle strength and tone. No cyanosis or edema.   SKIN: Palpable 2 cm soft tissue mass on the left lateral upper back just below the bra line. It is soft and mobile consistent with a lipoma .Turgor is good and there are no pathologic skin lesions or ulcers. NEUROLOGIC: Motor and sensation is grossly normal. Cranial nerves are grossly intact. PSYCH:  Oriented to person, place and time. Affect is normal.  Data Reviewed CT scan reviewed after examining patient with a barely visible 2 cm soft tissue mass that this that correlates with physical findings I have personally reviewed the patient's imaging, laboratory findings and medical records.    Assessment    64 year old female with a soft tissue mass consistent with lipoma.    Plan    Discussed with patient the 2 indications for excision all biopsy of a soft tissue mass are growth and discomfort. Patient does not believes that the area has grown however we will start patient attention to it and if it does grow return for removal. It does not really bother her and she's not really interested in having it removed at this time. Discussed that with her other multiple medical problems that they are more important to be fully investigated than this small soft tissue mass. She'll return to clinic should the area bother her anymore or she thinks a gross. Follow-up as needed.     Time spent with the patient was 30 minutes,  with more than 50% of the time spent in face-to-face education, counseling and care coordination.     Clayburn Pert 07/17/2015, 11:55 AM

## 2015-07-17 NOTE — Patient Instructions (Signed)
This lipoma is 2 cm and if you or your primary care doctor notices getting it bigger, please let us know.  Please contact your primary care doctor and ask when they will schedule the appointment with your cardiologist.

## 2015-07-18 ENCOUNTER — Telehealth: Payer: Self-pay | Admitting: Family Medicine

## 2015-07-18 NOTE — Telephone Encounter (Signed)
Patient states that the cardio referral was sent to the wrong place and now they are not able to see her until next month. She would like to know if you could recommend another doctor someone who could possibly get her in sooner.

## 2015-07-19 NOTE — Telephone Encounter (Signed)
Routed to [facility] to see if she can find another place to send where patient can get an earlier appointment

## 2015-07-20 NOTE — Telephone Encounter (Signed)
I have the patient scheduled with Dr. Saralyn Pilar on 07-24-15 at 3:15pm. Patient has been notified of this. Will fax them all the office notes.

## 2015-07-24 ENCOUNTER — Other Ambulatory Visit: Payer: Self-pay | Admitting: Family Medicine

## 2015-08-08 ENCOUNTER — Ambulatory Visit: Payer: BLUE CROSS/BLUE SHIELD | Admitting: Cardiovascular Disease

## 2015-08-10 NOTE — Progress Notes (Signed)
SPOKE WITH   PT HAD ECHO DONE ON 08-06-15  FOR FLUID  AROUND HEART   HAS  F/U   ON 08-14-15 TO DISCUSS  FINDINGS WILL  AWAIT  FINDINGS  AS PT  MAY NOT   NEED   FURTHER   CARDIO  FOLLOWING .Adonis Housekeeper

## 2015-08-23 ENCOUNTER — Ambulatory Visit
Admission: RE | Admit: 2015-08-23 | Discharge: 2015-08-23 | Disposition: A | Discharge status: Home / Self Care | Payer: BLUE CROSS/BLUE SHIELD | Source: Ambulatory Visit

## 2015-08-23 DIAGNOSIS — Z1231 Encounter for screening mammogram for malignant neoplasm of breast: Secondary | ICD-10-CM

## 2015-08-29 ENCOUNTER — Other Ambulatory Visit: Payer: Self-pay | Admitting: Family Medicine

## 2015-09-13 ENCOUNTER — Ambulatory Visit: Payer: BLUE CROSS/BLUE SHIELD | Admitting: Cardiovascular Disease

## 2015-09-16 ENCOUNTER — Other Ambulatory Visit: Payer: Self-pay | Admitting: Family Medicine

## 2015-09-21 ENCOUNTER — Ambulatory Visit: Payer: BLUE CROSS/BLUE SHIELD | Admitting: Family Medicine

## 2015-09-26 ENCOUNTER — Encounter: Payer: Self-pay | Admitting: Internal Medicine

## 2015-09-27 ENCOUNTER — Other Ambulatory Visit: Payer: Self-pay | Admitting: Family Medicine

## 2015-10-04 ENCOUNTER — Ambulatory Visit (INDEPENDENT_AMBULATORY_CARE_PROVIDER_SITE_OTHER): Payer: BLUE CROSS/BLUE SHIELD | Admitting: Family Medicine

## 2015-10-04 ENCOUNTER — Encounter: Payer: Self-pay | Admitting: Family Medicine

## 2015-10-04 VITALS — BP 118/76 | HR 80 | Temp 98.3°F | Resp 16 | Ht 68.0 in | Wt 221.4 lb

## 2015-10-04 DIAGNOSIS — R5383 Other fatigue: Secondary | ICD-10-CM

## 2015-10-04 MED ORDER — ROSUVASTATIN CALCIUM 10 MG PO TABS: 10.0000 mg | ORAL_TABLET | Freq: Every day | ORAL | Status: DC

## 2015-10-04 NOTE — Progress Notes (Signed)
Name: Robin Bryan   MRN: [id number]    DOB: June 10, 1951   Date:10/04/2015       Progress Note  Subjective  Chief Complaint  Chief Complaint  Patient presents with  . Depression    patient states feels depressed and fatigue.  Maybe needs bloodwork.    HPI  Fatigue  Pt. Presents for evaluation of fatigue. She reports her 'get up and go drive' is gone. Reports that she is sluggish and has not been as physically active as she used to be.  Of note: pt. Has history of Depression and is on Sertraline 150 mg daily. Reports medication is working well.  Past Medical History  Diagnosis Date  . GERD (gastroesophageal reflux disease)   . Esophageal stricture   . Barrett's esophagus   . Hx of adenomatous colonic polyps   . Diverticulosis   . Anxiety   . Hyperlipidemia   . Hypertension   . Stroke ([place])   . Bronchitis   . Intramural leiomyoma of uterus   . Depression   . Myocardial infarction ([place])   . Fibromyalgia   . Arthritis   . Asthma     Past Surgical History  Procedure Laterality Date  . Abdominal hysterectomy    . Tubal ligation    . Incontinence surgery      Family History  Problem Relation Age of Onset  . Ovarian cancer Sister   . Cancer Sister   . Diabetes Sister   . Heart disease Sister   . Lung cancer Sister   . Colon polyps Mother   . Diabetes Mother   . Parkinson's disease Cousin   . Cancer Father     lung  . Diabetes Daughter     Social History   Social History  . Marital Status: Married    Spouse Name: N/A  . Number of Children: N/A  . Years of Education: N/A   Occupational History  . Not on file.   Social History Main Topics  . Smoking status: Former Smoker -- 0.50 packs/day for 8 years    Types: Cigarettes    Quit date: 05/27/2011  . Smokeless tobacco: Never Used  . Alcohol Use: Yes     Comment: social , not everyday  . Drug Use: No  . Sexual Activity: Not on file   Other Topics Concern  . Not on file   Social History Narrative      Current outpatient prescriptions:  .  albuterol (PROVENTIL HFA;VENTOLIN HFA) 108 (90 BASE) MCG/ACT inhaler, Inhale 2 puffs into the lungs every 6 (six) hours as needed for wheezing or shortness of breath., Disp: 1 Inhaler, Rfl: 0 .  ALPRAZolam (XANAX) 0.5 MG tablet, Take 1 tablet (0.5 mg total) by mouth at bedtime as needed for anxiety., Disp: 30 tablet, Rfl: 2 .  clopidogrel (PLAVIX) 75 MG tablet, TAKE 1 TABLET(75 MG TOTAL) BY MOUTH DAILY., Disp: 30 tablet, Rfl: 11 .  EPINEPHrine 0.3 mg/0.3 mL IJ SOAJ injection, Inject 0.3 mLs (0.3 mg total) into the muscle once., Disp: 1 Device, Rfl: 0 .  gabapentin (NEURONTIN) 600 MG tablet, Take 600 mg by mouth 3 (three) times daily., Disp: , Rfl:  .  losartan (COZAAR) 100 MG tablet, TAKE 1 TABLET BY MOUTH EVERY DAY, Disp: 90 tablet, Rfl: 0 .  MYRBETRIQ 25 MG TB24 tablet, , Disp: , Rfl:  .  oxyCODONE-acetaminophen (PERCOCET/ROXICET) 5-325 MG tablet, 1 tablet 1 day or 1 dose. , Disp: , Rfl: 0 .  pantoprazole (PROTONIX)  40 MG tablet, Take 1 tablet (40 mg total) by mouth daily., Disp: 60 tablet, Rfl: 11 .  rosuvastatin (CRESTOR) 10 MG tablet, Take 1 tablet (10 mg total) by mouth daily., Disp: 30 tablet, Rfl: 2 .  sertraline (ZOLOFT) 100 MG tablet, Take 1.5 tablets (150 mg total) by mouth daily., Disp: 45 tablet, Rfl: 2  Allergies  Allergen Reactions  . Vicodin [Hydrocodone-Acetaminophen] Nausea Only    Review of Systems  Constitutional: Negative for fever, chills and weight loss.  Eyes: Positive for blurred vision and double vision.  Respiratory: Negative for shortness of breath.   Cardiovascular: Negative for chest pain.  Gastrointestinal: Negative for abdominal pain, diarrhea and constipation.  Musculoskeletal: Positive for joint pain.  Psychiatric/Behavioral: Positive for depression. The patient is nervous/anxious and has insomnia.     Objective  Filed Vitals:   10/04/15 1458  BP: 118/76  Pulse: 80  Temp: 98.3 F (36.8 C)  TempSrc:  Oral  Resp: 16  Height: 5\' 8"  (1.727 m)  Weight: 221 lb 6.4 oz (100.426 kg)  SpO2: 96%    Physical Exam  Constitutional: She is oriented to person, place, and time and well-developed, well-nourished, and in no distress.  HENT:  Head: Normocephalic and atraumatic.  Cardiovascular: Normal rate, regular rhythm and normal heart sounds.   Pulmonary/Chest: Effort normal and breath sounds normal.  Abdominal: Soft. Bowel sounds are normal.  Neurological: She is alert and oriented to person, place, and time.  Nursing note and vitals reviewed.     Assessment & Plan  1. Other fatigue Obtain laboratory evaluation for possible etiologies of fatigue. - CBC with Differential - Comprehensive Metabolic Panel (CMET) - TSH - Vitamin D (25 hydroxy) - B12   Kemba Hoppes [facility] 10/04/2015 3:07 PM

## 2015-10-10 LAB — CBC WITH DIFFERENTIAL/PLATELET
BASOS: 1 %
Basophils Absolute: 0.1 10*3/uL (ref 0.0–0.2)
EOS (ABSOLUTE): 0.3 10*3/uL (ref 0.0–0.4)
Eos: 4 %
HEMATOCRIT: 41.5 % (ref 34.0–46.6)
Hemoglobin: 14.3 g/dL (ref 11.1–15.9)
IMMATURE GRANS (ABS): 0 10*3/uL (ref 0.0–0.1)
Immature Granulocytes: 0 %
Lymphocytes Absolute: 2.6 10*3/uL (ref 0.7–3.1)
Lymphs: 35 %
MCH: 31.4 pg (ref 26.6–33.0)
MCHC: 34.5 g/dL (ref 31.5–35.7)
MCV: 91 fL (ref 79–97)
Monocytes Absolute: 0.4 10*3/uL (ref 0.1–0.9)
Monocytes: 5 %
NEUTROS ABS: 4.1 10*3/uL (ref 1.4–7.0)
Neutrophils: 55 %
PLATELETS: 324 10*3/uL (ref 150–379)
RBC: 4.56 x10E6/uL (ref 3.77–5.28)
RDW: 13.6 % (ref 12.3–15.4)
WBC: 7.4 10*3/uL (ref 3.4–10.8)

## 2015-10-10 LAB — COMPREHENSIVE METABOLIC PANEL
ALT: 22 IU/L (ref 0–32)
AST: 16 IU/L (ref 0–40)
Albumin/Globulin Ratio: 2.6 — ABNORMAL HIGH (ref 1.1–2.5)
Albumin: 4.4 g/dL (ref 3.6–4.8)
Alkaline Phosphatase: 86 IU/L (ref 39–117)
BILIRUBIN TOTAL: 0.4 mg/dL (ref 0.0–1.2)
BUN / CREAT RATIO: 21 (ref 11–26)
BUN: 14 mg/dL (ref 8–27)
CHLORIDE: 102 mmol/L (ref 96–106)
CO2: 27 mmol/L (ref 18–29)
Calcium: 9.3 mg/dL (ref 8.7–10.3)
Creatinine, Ser: 0.67 mg/dL (ref 0.57–1.00)
GFR calc Af Amer: 109 mL/min/{1.73_m2} (ref >59)
GFR calc non Af Amer: 95 mL/min/{1.73_m2} (ref >59)
GLOBULIN, TOTAL: 1.7 g/dL (ref 1.5–4.5)
Glucose: 104 mg/dL — ABNORMAL HIGH (ref 65–99)
Potassium: 4.5 mmol/L (ref 3.5–5.2)
Sodium: 143 mmol/L (ref 134–144)
Total Protein: 6.1 g/dL (ref 6.0–8.5)

## 2015-10-10 LAB — VITAMIN D 25 HYDROXY (VIT D DEFICIENCY, FRACTURES): Vit D, 25-Hydroxy: 28.8 ng/mL — ABNORMAL LOW (ref 30.0–100.0)

## 2015-10-10 LAB — TSH: TSH: 1.86 u[IU]/mL (ref 0.450–4.500)

## 2015-10-10 LAB — VITAMIN B12: Vitamin B-12: 492 pg/mL (ref 211–946)

## 2015-10-17 ENCOUNTER — Encounter: Payer: Self-pay | Admitting: Family Medicine

## 2015-10-17 ENCOUNTER — Ambulatory Visit (INDEPENDENT_AMBULATORY_CARE_PROVIDER_SITE_OTHER): Payer: BLUE CROSS/BLUE SHIELD | Admitting: Family Medicine

## 2015-10-17 VITALS — BP 118/70 | HR 87 | Temp 98.3°F | Resp 18 | Ht 68.0 in | Wt 220.1 lb

## 2015-10-17 DIAGNOSIS — F3341 Major depressive disorder, recurrent, in partial remission: Secondary | ICD-10-CM | POA: Diagnosis not present

## 2015-10-17 DIAGNOSIS — F410 Panic disorder [episodic paroxysmal anxiety] without agoraphobia: Secondary | ICD-10-CM | POA: Diagnosis not present

## 2015-10-17 MED ORDER — ALPRAZOLAM 0.5 MG PO TABS: 0.5000 mg | ORAL_TABLET | Freq: Two times a day (BID) | ORAL | Status: DC | PRN

## 2015-10-17 MED ORDER — SERTRALINE HCL 50 MG PO TABS: 50.0000 mg | ORAL_TABLET | Freq: Every day | ORAL | Status: DC

## 2015-10-17 MED ORDER — SERTRALINE HCL 25 MG PO TABS: 25.0000 mg | ORAL_TABLET | Freq: Every day | ORAL | Status: DC

## 2015-10-17 NOTE — Progress Notes (Signed)
Name: Robin Bryan   MRN: [id number]    DOB: [birth year]   Date:10/17/2015       Progress Note  Subjective  Chief Complaint  Chief Complaint  Patient presents with  . Follow-up    2 wk medication  . Hyperlipidemia  . Anxiety  . Medication Refill    xanax     Anxiety Presents for follow-up visit. The problem has been unchanged. Symptoms include depressed mood, excessive worry, nervous/anxious behavior and panic. Patient reports no chest pain, decreased concentration or shortness of breath. The severity of symptoms is moderate. The quality of sleep is good.   Past treatments include benzodiazephines. Compliance with prior treatments has been good (Takes one in AM as needed for anxiety/panic attack and 1 at bedtime).  Depression        This is a chronic problem.The problem is unchanged.  Associated symptoms include fatigue, decreased interest and sad.  Associated symptoms include no decreased concentration.  Past treatments include SSRIs - Selective serotonin reuptake inhibitors.  Compliance with treatment is good.  Previous treatment provided moderate (Feels like she has breakthrough symptoms of Depression while on high-dose Sertraline.) relief.  Past medical history includes anxiety.     Past Medical History  Diagnosis Date  . GERD (gastroesophageal reflux disease)   . Esophageal stricture   . Barrett's esophagus   . Hx of adenomatous colonic polyps   . Diverticulosis   . Anxiety   . Hyperlipidemia   . Hypertension   . Stroke ([place])   . Bronchitis   . Intramural leiomyoma of uterus   . Depression   . Myocardial infarction ([place])   . Fibromyalgia   . Arthritis   . Asthma     Past Surgical History  Procedure Laterality Date  . Abdominal hysterectomy    . Tubal ligation    . Incontinence surgery      Family History  Problem Relation Age of Onset  . Ovarian cancer Sister   . Cancer Sister   . Diabetes Sister   . Heart disease Sister   . Lung cancer Sister   . Colon  polyps Mother   . Diabetes Mother   . Parkinson's disease Cousin   . Cancer Father     lung  . Diabetes Daughter     Social History   Social History  . Marital Status: Married    Spouse Name: N/A  . Number of Children: N/A  . Years of Education: N/A   Occupational History  . Not on file.   Social History Main Topics  . Smoking status: Former Smoker -- 0.50 packs/day for 8 years    Types: Cigarettes    Quit date: 05/27/2011  . Smokeless tobacco: Never Used  . Alcohol Use: Yes     Comment: social , not everyday  . Drug Use: No  . Sexual Activity: Not on file   Other Topics Concern  . Not on file   Social History Narrative     Current outpatient prescriptions:  .  albuterol (PROVENTIL HFA;VENTOLIN HFA) 108 (90 BASE) MCG/ACT inhaler, Inhale 2 puffs into the lungs every 6 (six) hours as needed for wheezing or shortness of breath., Disp: 1 Inhaler, Rfl: 0 .  ALPRAZolam (XANAX) 0.5 MG tablet, Take 1 tablet (0.5 mg total) by mouth at bedtime as needed for anxiety., Disp: 30 tablet, Rfl: 2 .  clopidogrel (PLAVIX) 75 MG tablet, TAKE 1 TABLET(75 MG TOTAL) BY MOUTH DAILY., Disp: 30 tablet, Rfl: 11 .  EPINEPHrine 0.3 mg/0.3 mL IJ SOAJ injection, Inject 0.3 mLs (0.3 mg total) into the muscle once., Disp: 1 Device, Rfl: 0 .  gabapentin (NEURONTIN) 600 MG tablet, Take 600 mg by mouth 3 (three) times daily., Disp: , Rfl:  .  losartan (COZAAR) 100 MG tablet, TAKE 1 TABLET BY MOUTH EVERY DAY, Disp: 90 tablet, Rfl: 0 .  MYRBETRIQ 25 MG TB24 tablet, , Disp: , Rfl:  .  oxyCODONE-acetaminophen (PERCOCET/ROXICET) 5-325 MG tablet, 1 tablet 1 day or 1 dose. , Disp: , Rfl: 0 .  pantoprazole (PROTONIX) 40 MG tablet, Take 1 tablet (40 mg total) by mouth daily., Disp: 60 tablet, Rfl: 11 .  rosuvastatin (CRESTOR) 10 MG tablet, Take 1 tablet (10 mg total) by mouth daily., Disp: 30 tablet, Rfl: 2 .  sertraline (ZOLOFT) 100 MG tablet, Take 1.5 tablets (150 mg total) by mouth daily., Disp: 45 tablet,  Rfl: 2  Allergies  Allergen Reactions  . Vicodin [Hydrocodone-Acetaminophen] Nausea Only     Review of Systems  Constitutional: Positive for fatigue.  Respiratory: Negative for shortness of breath.   Cardiovascular: Negative for chest pain.  Psychiatric/Behavioral: Positive for depression. Negative for decreased concentration. The patient is nervous/anxious.      Objective  Filed Vitals:   10/17/15 1429  BP: 118/70  Pulse: 87  Temp: 98.3 F (36.8 C)  TempSrc: Oral  Resp: 18  Height: 5\' 8"  (1.727 m)  Weight: 220 lb 1.6 oz (99.837 kg)  SpO2: 97%    Physical Exam  Constitutional: She is oriented to person, place, and time and well-developed, well-nourished, and in no distress.  Cardiovascular: Normal rate and regular rhythm.   Pulmonary/Chest: Effort normal and breath sounds normal.  Neurological: She is alert and oriented to person, place, and time.  Psychiatric: Mood, memory, affect and judgment normal.  Nursing note and vitals reviewed.    Assessment & Plan  1. Panic disorder without agoraphobia Changed alprazolam to 0.5 mg twice a day as needed. Refills provided. - ALPRAZolam (XANAX) 0.5 MG tablet; Take 1 tablet (0.5 mg total) by mouth 2 (two) times daily as needed for anxiety.  Dispense: 60 tablet; Refill: 2  2. Major depressive disorder, recurrent episode, in partial remission ([place])  DC Zoloft was slow taper over 15 days, and will return to restart on an SNRI such as venlafaxine. Patient verbalized agreement with plan - sertraline (ZOLOFT) 50 MG tablet; Take 1 tablet (50 mg total) by mouth daily.  Dispense: 10 tablet; Refill: 0 - sertraline (ZOLOFT) 25 MG tablet; Take 1 tablet (25 mg total) by mouth daily.  Dispense: 10 tablet; Refill: 0   Blayklee Mable [facility] 10/17/2015 2:51 PM

## 2015-10-20 ENCOUNTER — Other Ambulatory Visit: Payer: Self-pay | Admitting: Family Medicine

## 2015-10-30 ENCOUNTER — Ambulatory Visit
Admission: RE | Admit: 2015-10-30 | Discharge: 2015-10-30 | Disposition: A | Discharge status: Home / Self Care | Payer: BLUE CROSS/BLUE SHIELD | Source: Ambulatory Visit | Attending: Family Medicine | Admitting: Family Medicine

## 2015-10-30 ENCOUNTER — Encounter: Payer: Self-pay | Admitting: Family Medicine

## 2015-10-30 ENCOUNTER — Ambulatory Visit (INDEPENDENT_AMBULATORY_CARE_PROVIDER_SITE_OTHER): Payer: BLUE CROSS/BLUE SHIELD | Admitting: Family Medicine

## 2015-10-30 VITALS — BP 118/68 | HR 76 | Temp 98.4°F | Resp 17 | Ht 68.0 in | Wt 218.6 lb

## 2015-10-30 DIAGNOSIS — M546 Pain in thoracic spine: Secondary | ICD-10-CM

## 2015-10-30 DIAGNOSIS — M160 Bilateral primary osteoarthritis of hip: Secondary | ICD-10-CM | POA: Diagnosis not present

## 2015-10-30 DIAGNOSIS — M25551 Pain in right hip: Secondary | ICD-10-CM | POA: Diagnosis not present

## 2015-10-30 NOTE — Progress Notes (Signed)
Name: Robin Bryan   MRN: [id number]    DOB: 16-Jan-1951   Date:10/30/2015       Progress Note  Subjective  Chief Complaint  Chief Complaint  Patient presents with  . Follow-up    Back/hip pain    Back Pain This is a new problem. Episode onset: 3 weeks ago. The pain is present in the lumbar spine and gluteal. The quality of the pain is described as shooting. The pain is at a severity of 7/10. The symptoms are aggravated by bending. Pertinent negatives include no bladder incontinence, bowel incontinence, fever, leg pain, numbness, paresis or weakness. Paresthesias: numbness and tingling in her feet, 2/2 fibromyalgia. Treatments tried: Wrapping a belt around her lower back makes it better.     Past Medical History  Diagnosis Date  . GERD (gastroesophageal reflux disease)   . Esophageal stricture   . Barrett's esophagus   . Hx of adenomatous colonic polyps   . Diverticulosis   . Anxiety   . Hyperlipidemia   . Hypertension   . Stroke ([place])   . Bronchitis   . Intramural leiomyoma of uterus   . Depression   . Myocardial infarction ([place])   . Fibromyalgia   . Arthritis   . Asthma     Past Surgical History  Procedure Laterality Date  . Abdominal hysterectomy    . Tubal ligation    . Incontinence surgery      Family History  Problem Relation Age of Onset  . Ovarian cancer Sister   . Cancer Sister   . Diabetes Sister   . Heart disease Sister   . Lung cancer Sister   . Colon polyps Mother   . Diabetes Mother   . Parkinson's disease Cousin   . Cancer Father     lung  . Diabetes Daughter     Social History   Social History  . Marital Status: Married    Spouse Name: N/A  . Number of Children: N/A  . Years of Education: N/A   Occupational History  . Not on file.   Social History Main Topics  . Smoking status: Former Smoker -- 0.50 packs/day for 8 years    Types: Cigarettes    Quit date: 05/27/2011  . Smokeless tobacco: Never Used  . Alcohol Use: Yes   Comment: social , not everyday  . Drug Use: No  . Sexual Activity: Not on file   Other Topics Concern  . Not on file   Social History Narrative     Current outpatient prescriptions:  .  albuterol (PROVENTIL HFA;VENTOLIN HFA) 108 (90 BASE) MCG/ACT inhaler, Inhale 2 puffs into the lungs every 6 (six) hours as needed for wheezing or shortness of breath., Disp: 1 Inhaler, Rfl: 0 .  ALPRAZolam (XANAX) 0.5 MG tablet, Take 1 tablet (0.5 mg total) by mouth 2 (two) times daily as needed for anxiety., Disp: 60 tablet, Rfl: 2 .  clopidogrel (PLAVIX) 75 MG tablet, TAKE 1 TABLET(75 MG TOTAL) BY MOUTH DAILY., Disp: 30 tablet, Rfl: 11 .  EPINEPHrine 0.3 mg/0.3 mL IJ SOAJ injection, Inject 0.3 mLs (0.3 mg total) into the muscle once., Disp: 1 Device, Rfl: 0 .  gabapentin (NEURONTIN) 600 MG tablet, Take 600 mg by mouth 3 (three) times daily., Disp: , Rfl:  .  losartan (COZAAR) 100 MG tablet, TAKE 1 TABLET BY MOUTH EVERY DAY, Disp: 90 tablet, Rfl: 0 .  MYRBETRIQ 25 MG TB24 tablet, , Disp: , Rfl:  .  oxyCODONE-acetaminophen (PERCOCET/ROXICET) 5-325  MG tablet, 1 tablet 1 day or 1 dose. , Disp: , Rfl: 0 .  pantoprazole (PROTONIX) 40 MG tablet, Take 1 tablet (40 mg total) by mouth daily., Disp: 60 tablet, Rfl: 11 .  rosuvastatin (CRESTOR) 10 MG tablet, Take 1 tablet (10 mg total) by mouth daily., Disp: 30 tablet, Rfl: 2 .  sertraline (ZOLOFT) 25 MG tablet, Take 1 tablet (25 mg total) by mouth daily., Disp: 10 tablet, Rfl: 0 .  sertraline (ZOLOFT) 50 MG tablet, Take 1 tablet (50 mg total) by mouth daily., Disp: 10 tablet, Rfl: 0  Allergies  Allergen Reactions  . Vicodin [Hydrocodone-Acetaminophen] Nausea Only     Review of Systems  Constitutional: Negative for fever.  Gastrointestinal: Negative for bowel incontinence.  Genitourinary: Negative for bladder incontinence.  Musculoskeletal: Positive for back pain and joint pain.  Neurological: Negative for weakness and numbness. Paresthesias: numbness and  tingling in her feet, 2/2 fibromyalgia.     Objective  Filed Vitals:   10/30/15 1407  BP: 118/68  Pulse: 76  Temp: 98.4 F (36.9 C)  TempSrc: Oral  Resp: 17  Height: 5\' 8"  (1.727 m)  Weight: 218 lb 9.6 oz (99.156 kg)  SpO2: 96%    Physical Exam  Musculoskeletal:       Right hip: She exhibits tenderness.       Back:       Legs: Nursing note and vitals reviewed.      Assessment & Plan  1. Acute left-sided thoracic back pain  - DG Thoracic Spine 2 View; Future  2. Acute right hip pain  suspect osteoarthritis. Will obtain x-ray for evaluation - DG HIP UNILAT WITH PELVIS 2-3 VIEWS RIGHT; Future   Malyah Ohlrich [facility] 10/30/2015 2:13 PM

## 2015-10-31 ENCOUNTER — Encounter: Payer: Self-pay | Admitting: Family Medicine

## 2015-10-31 ENCOUNTER — Ambulatory Visit (INDEPENDENT_AMBULATORY_CARE_PROVIDER_SITE_OTHER): Payer: BLUE CROSS/BLUE SHIELD | Admitting: Family Medicine

## 2015-10-31 VITALS — BP 118/73 | HR 77 | Temp 98.6°F | Resp 16 | Ht 68.0 in | Wt 218.0 lb

## 2015-10-31 DIAGNOSIS — F3341 Major depressive disorder, recurrent, in partial remission: Secondary | ICD-10-CM

## 2015-10-31 MED ORDER — VENLAFAXINE HCL ER 75 MG PO CP24: 75.0000 mg | ORAL_CAPSULE | Freq: Every day | ORAL | Status: DC

## 2015-10-31 NOTE — Progress Notes (Signed)
Name: Robin Bryan   MRN: [id number]    DOB: 01-31-1951   Date:10/31/2015       Progress Note  Subjective  Chief Complaint  Chief Complaint  Patient presents with  . Follow-up    Medication change    Depression        This is a chronic problem.  The onset quality is gradual.   Associated symptoms include fatigue, irritable, decreased interest, body aches, headaches and sad.  Past treatments include SSRIs - Selective serotonin reuptake inhibitors (Has tried Sertraline, still had residual symptoms, less energy, feeling sad.).  Patient returns for change in pharmacotherapy for major depression. She has tapered off sertraline.   Past Medical History  Diagnosis Date  . GERD (gastroesophageal reflux disease)   . Esophageal stricture   . Barrett's esophagus   . Hx of adenomatous colonic polyps   . Diverticulosis   . Anxiety   . Hyperlipidemia   . Hypertension   . Stroke ([place])   . Bronchitis   . Intramural leiomyoma of uterus   . Depression   . Myocardial infarction ([place])   . Fibromyalgia   . Arthritis   . Asthma     Past Surgical History  Procedure Laterality Date  . Abdominal hysterectomy    . Tubal ligation    . Incontinence surgery      Family History  Problem Relation Age of Onset  . Ovarian cancer Sister   . Cancer Sister   . Diabetes Sister   . Heart disease Sister   . Lung cancer Sister   . Colon polyps Mother   . Diabetes Mother   . Parkinson's disease Cousin   . Cancer Father     lung  . Diabetes Daughter     Social History   Social History  . Marital Status: Married    Spouse Name: N/A  . Number of Children: N/A  . Years of Education: N/A   Occupational History  . Not on file.   Social History Main Topics  . Smoking status: Former Smoker -- 0.50 packs/day for 8 years    Types: Cigarettes    Quit date: 05/27/2011  . Smokeless tobacco: Never Used  . Alcohol Use: Yes     Comment: social , not everyday  . Drug Use: No  . Sexual Activity:  Not on file   Other Topics Concern  . Not on file   Social History Narrative     Current outpatient prescriptions:  .  albuterol (PROVENTIL HFA;VENTOLIN HFA) 108 (90 BASE) MCG/ACT inhaler, Inhale 2 puffs into the lungs every 6 (six) hours as needed for wheezing or shortness of breath., Disp: 1 Inhaler, Rfl: 0 .  ALPRAZolam (XANAX) 0.5 MG tablet, Take 1 tablet (0.5 mg total) by mouth 2 (two) times daily as needed for anxiety., Disp: 60 tablet, Rfl: 2 .  clopidogrel (PLAVIX) 75 MG tablet, TAKE 1 TABLET(75 MG TOTAL) BY MOUTH DAILY., Disp: 30 tablet, Rfl: 11 .  EPINEPHrine 0.3 mg/0.3 mL IJ SOAJ injection, Inject 0.3 mLs (0.3 mg total) into the muscle once., Disp: 1 Device, Rfl: 0 .  gabapentin (NEURONTIN) 600 MG tablet, Take 600 mg by mouth 3 (three) times daily., Disp: , Rfl:  .  losartan (COZAAR) 100 MG tablet, TAKE 1 TABLET BY MOUTH EVERY DAY, Disp: 90 tablet, Rfl: 0 .  MYRBETRIQ 25 MG TB24 tablet, , Disp: , Rfl:  .  oxyCODONE-acetaminophen (PERCOCET/ROXICET) 5-325 MG tablet, 1 tablet 1 day or 1 dose. , Disp: ,  Rfl: 0 .  pantoprazole (PROTONIX) 40 MG tablet, Take 1 tablet (40 mg total) by mouth daily., Disp: 60 tablet, Rfl: 11 .  rosuvastatin (CRESTOR) 10 MG tablet, Take 1 tablet (10 mg total) by mouth daily., Disp: 30 tablet, Rfl: 2 .  sertraline (ZOLOFT) 25 MG tablet, Take 1 tablet (25 mg total) by mouth daily. (Patient not taking: Reported on 10/31/2015), Disp: 10 tablet, Rfl: 0 .  sertraline (ZOLOFT) 50 MG tablet, Take 1 tablet (50 mg total) by mouth daily. (Patient not taking: Reported on 10/31/2015), Disp: 10 tablet, Rfl: 0  Allergies  Allergen Reactions  . Vicodin [Hydrocodone-Acetaminophen] Nausea Only     Review of Systems  Constitutional: Positive for fatigue.  Neurological: Positive for headaches.  Psychiatric/Behavioral: Positive for depression.     Objective  Filed Vitals:   10/31/15 1347  BP: 118/73  Pulse: 77  Temp: 98.6 F (37 C)  TempSrc: Oral  Resp: 16   Height: 5\' 8"  (1.727 m)  Weight: 218 lb (98.884 kg)  SpO2: 97%    Physical Exam  Constitutional: She is oriented to person, place, and time and well-developed, well-nourished, and in no distress. She is irritable.  Cardiovascular: Normal rate and regular rhythm.   Pulmonary/Chest: Effort normal and breath sounds normal.  Neurological: She is alert and oriented to person, place, and time.  Psychiatric: Mood, memory, affect and judgment normal.  Nursing note and vitals reviewed.   Assessment & Plan  1. Recurrent major depressive disorder, in partial remission ([facility]) We will start on Effexor XR 75 mg daily. Patient educated on potential adverse effects. Recheck in 6 weeks. - venlafaxine XR (EFFEXOR-XR) 75 MG 24 hr capsule; Take 1 capsule (75 mg total) by mouth daily with breakfast.  Dispense: 30 capsule; Refill: 2   Robin Bryan [facility] 10/31/2015 1:53 PM

## 2015-12-04 ENCOUNTER — Telehealth: Payer: Self-pay | Admitting: Internal Medicine

## 2015-12-04 MED ORDER — ESOMEPRAZOLE MAGNESIUM 40 MG PO CPDR: 40.0000 mg | DELAYED_RELEASE_CAPSULE | Freq: Two times a day (BID) | ORAL | Status: DC

## 2015-12-04 NOTE — Telephone Encounter (Signed)
Pt aware and Nexium script sent to pharmacy.

## 2015-12-04 NOTE — Telephone Encounter (Signed)
Pt states she was taking nexium but had to change due to insurance to protonix. States that this is not working. Reports she is having issues with burning in her chest, bad indigestion. Pt states she is now taking protonix 40mg  bid and still having issues. Please advise.

## 2015-12-04 NOTE — Telephone Encounter (Signed)
Go back to Nexium

## 2015-12-14 ENCOUNTER — Ambulatory Visit: Payer: BLUE CROSS/BLUE SHIELD | Admitting: Family Medicine

## 2015-12-14 ENCOUNTER — Encounter: Payer: Self-pay | Admitting: Family Medicine

## 2015-12-14 ENCOUNTER — Ambulatory Visit (INDEPENDENT_AMBULATORY_CARE_PROVIDER_SITE_OTHER): Payer: BLUE CROSS/BLUE SHIELD | Admitting: Family Medicine

## 2015-12-14 VITALS — BP 110/74 | HR 77 | Temp 97.8°F | Resp 16 | Ht 68.0 in | Wt 213.6 lb

## 2015-12-14 DIAGNOSIS — R6889 Other general symptoms and signs: Secondary | ICD-10-CM | POA: Insufficient documentation

## 2015-12-14 MED ORDER — HYDROCOD POLST-CPM POLST ER 10-8 MG/5ML PO SUER: 5.0000 mL | Freq: Two times a day (BID) | ORAL | Status: DC | PRN

## 2015-12-14 NOTE — Progress Notes (Signed)
Name: Robin Bryan   MRN: [id number]    DOB: 11-04-1950   Date:12/14/2015       Progress Note  Subjective  Chief Complaint  Chief Complaint  Patient presents with  . Sore Throat    patient stated that she was seen at the urgent care last sat. patient's grandkids were diagnosed with flu B and she watched them while they were sick. she was given tamiflu. husband had pneumonia.  . Cough    patient stated that she did have a productive cough at first with greenish sputum but not it is a milky whitish color  . Abdominal Pain    patient presents with RLQ pain that she thinks came from coughing so much.  . Fever    patient stated that at night she has ran a fever of 99.7, but then she sweats it out.    HPI  Urgent care Follow up: Seen at Urgent Care last Friday for fever, dry cough and sore throat. Was reportedly tested for both flu and strep and were negative. Was started on Tamiflu. Since being seen at the Urgent Care, she reports no real improvement in her symptoms, still coughing, sore throat is better, now has shortness of breath, and fever has resolved (last night Temp was 99.5F). Both patient and her husband were exposed to grandchildren who had confirmed flu the week prior.  Past Medical History  Diagnosis Date  . GERD (gastroesophageal reflux disease)   . Esophageal stricture   . Barrett's esophagus   . Hx of adenomatous colonic polyps   . Diverticulosis   . Anxiety   . Hyperlipidemia   . Hypertension   . Stroke ([place])   . Bronchitis   . Intramural leiomyoma of uterus   . Depression   . Myocardial infarction ([place])   . Fibromyalgia   . Arthritis   . Asthma     Past Surgical History  Procedure Laterality Date  . Abdominal hysterectomy    . Tubal ligation    . Incontinence surgery      Family History  Problem Relation Age of Onset  . Ovarian cancer Sister   . Cancer Sister   . Diabetes Sister   . Heart disease Sister   . Lung cancer Sister   . Colon polyps  Mother   . Diabetes Mother   . Parkinson's disease Cousin   . Cancer Father     lung  . Diabetes Daughter     Social History   Social History  . Marital Status: Married    Spouse Name: N/A  . Number of Children: N/A  . Years of Education: N/A   Occupational History  . Not on file.   Social History Main Topics  . Smoking status: Former Smoker -- 0.50 packs/day for 8 years    Types: Cigarettes    Quit date: 05/27/2011  . Smokeless tobacco: Never Used  . Alcohol Use: Yes     Comment: social , not everyday  . Drug Use: No  . Sexual Activity: Not on file   Other Topics Concern  . Not on file   Social History Narrative     Current outpatient prescriptions:  .  albuterol (PROVENTIL HFA;VENTOLIN HFA) 108 (90 BASE) MCG/ACT inhaler, Inhale 2 puffs into the lungs every 6 (six) hours as needed for wheezing or shortness of breath., Disp: 1 Inhaler, Rfl: 0 .  ALPRAZolam (XANAX) 0.5 MG tablet, Take 1 tablet (0.5 mg total) by mouth 2 (two) times daily  as needed for anxiety., Disp: 60 tablet, Rfl: 2 .  clopidogrel (PLAVIX) 75 MG tablet, TAKE 1 TABLET(75 MG TOTAL) BY MOUTH DAILY., Disp: 30 tablet, Rfl: 11 .  diclofenac (VOLTAREN) 75 MG EC tablet, , Disp: , Rfl: 10 .  donepezil (ARICEPT) 10 MG tablet, TAKE 1/2 TABLET BY MOUTH AT BEDTIME FOR 2 WEEKS THEN 1 TABLET AT BEDTIME THEREAFTER, Disp: , Rfl: 0 .  EPINEPHrine 0.3 mg/0.3 mL IJ SOAJ injection, Inject 0.3 mLs (0.3 mg total) into the muscle once., Disp: 1 Device, Rfl: 0 .  esomeprazole (NEXIUM) 40 MG capsule, Take 1 capsule (40 mg total) by mouth 2 (two) times daily before a meal., Disp: 60 capsule, Rfl: 3 .  gabapentin (NEURONTIN) 800 MG tablet, TK 1 T PO QID, Disp: , Rfl: 6 .  losartan (COZAAR) 100 MG tablet, TAKE 1 TABLET BY MOUTH EVERY DAY, Disp: 90 tablet, Rfl: 0 .  oseltamivir (TAMIFLU) 75 MG capsule, TK 1 C PO BID, Disp: , Rfl: 0 .  oxyCODONE-acetaminophen (PERCOCET/ROXICET) 5-325 MG tablet, 1 tablet 1 day or 1 dose. , Disp: ,  Rfl: 0 .  pantoprazole (PROTONIX) 40 MG tablet, Take 1 tablet (40 mg total) by mouth daily., Disp: 60 tablet, Rfl: 11 .  rosuvastatin (CRESTOR) 10 MG tablet, Take 1 tablet (10 mg total) by mouth daily., Disp: 30 tablet, Rfl: 2 .  tolterodine (DETROL LA) 2 MG 24 hr capsule, TK 1 C PO D, Disp: , Rfl: 11 .  venlafaxine XR (EFFEXOR-XR) 75 MG 24 hr capsule, Take 1 capsule (75 mg total) by mouth daily with breakfast., Disp: 30 capsule, Rfl: 2  Allergies  Allergen Reactions  . Vicodin [Hydrocodone-Acetaminophen] Nausea Only     ROS  Please see HPI  Objective  Filed Vitals:   12/14/15 0847  BP: 110/74  Pulse: 77  Temp: 97.8 F (36.6 C)  TempSrc: Oral  Resp: 16  Height: 5\' 8"  (1.727 m)  Weight: 213 lb 9.6 oz (96.888 kg)  SpO2: 97%    Physical Exam  Constitutional: She is oriented to person, place, and time and well-developed, well-nourished, and in no distress.  HENT:  Mouth/Throat: No posterior oropharyngeal erythema.  Cardiovascular: Normal rate and regular rhythm.   Pulmonary/Chest: Effort normal and breath sounds normal.  Neurological: She is alert and oriented to person, place, and time.  Nursing note and vitals reviewed.      Assessment & Plan  1. Flu-like symptoms Finished Tamiflu, we'll start on Tussionex to relieve coughing. - chlorpheniramine-HYDROcodone (TUSSIONEX PENNKINETIC ER) 10-8 MG/5ML SUER; Take 5 mLs by mouth every 12 (twelve) hours as needed for cough.  Dispense: 115 mL; Refill: 0   Anorah Trias [facility] 12/14/2015 8:58 AM

## 2015-12-18 ENCOUNTER — Ambulatory Visit: Payer: Self-pay | Admitting: Sports Medicine

## 2016-01-04 ENCOUNTER — Other Ambulatory Visit: Payer: Self-pay | Admitting: Family Medicine

## 2016-01-08 ENCOUNTER — Telehealth: Payer: Self-pay | Admitting: Family Medicine

## 2016-01-08 ENCOUNTER — Encounter: Payer: Self-pay | Admitting: Family Medicine

## 2016-01-08 ENCOUNTER — Ambulatory Visit (INDEPENDENT_AMBULATORY_CARE_PROVIDER_SITE_OTHER): Payer: BLUE CROSS/BLUE SHIELD | Admitting: Family Medicine

## 2016-01-08 VITALS — BP 108/70 | HR 76 | Temp 98.2°F | Resp 15 | Ht 68.0 in | Wt 224.1 lb

## 2016-01-08 DIAGNOSIS — E785 Hyperlipidemia, unspecified: Secondary | ICD-10-CM

## 2016-01-08 DIAGNOSIS — F3341 Major depressive disorder, recurrent, in partial remission: Secondary | ICD-10-CM

## 2016-01-08 MED ORDER — VENLAFAXINE HCL ER 75 MG PO CP24
75.0000 mg | ORAL_CAPSULE | Freq: Every day | ORAL | Status: DC
Start: 2016-01-08 — End: 2016-04-09

## 2016-01-08 NOTE — Progress Notes (Signed)
Name: Robin Bryan   MRN: [id number]    DOB: 03-24-1951   Date:01/08/2016       Progress Note  Subjective  Chief Complaint  Chief Complaint  Patient presents with  . Follow-up    discuss depression meds / venlafaxine 75 mg     HPI  Depression: Pt. Never picked up a refill for Venlafaxine XR 75 mg daily in April (only took 1 month of March). Since yesterday, she has picked up the prescription and restarted taking it.  In the last month, she has felt tired, nauseated, depressed and 'just ill'. She has cried frequently and only realized yesterday that she never started the first refill.   Past Medical History  Diagnosis Date  . GERD (gastroesophageal reflux disease)   . Esophageal stricture   . Barrett's esophagus   . Hx of adenomatous colonic polyps   . Diverticulosis   . Anxiety   . Hyperlipidemia   . Hypertension   . Stroke ([place])   . Bronchitis   . Intramural leiomyoma of uterus   . Depression   . Myocardial infarction ([place])   . Fibromyalgia   . Arthritis   . Asthma     Past Surgical History  Procedure Laterality Date  . Abdominal hysterectomy    . Tubal ligation    . Incontinence surgery      Family History  Problem Relation Age of Onset  . Ovarian cancer Sister   . Cancer Sister   . Diabetes Sister   . Heart disease Sister   . Lung cancer Sister   . Colon polyps Mother   . Diabetes Mother   . Parkinson's disease Cousin   . Cancer Father     lung  . Diabetes Daughter     Social History   Social History  . Marital Status: Married    Spouse Name: N/A  . Number of Children: N/A  . Years of Education: N/A   Occupational History  . Not on file.   Social History Main Topics  . Smoking status: Former Smoker -- 0.50 packs/day for 8 years    Types: Cigarettes    Quit date: 05/27/2011  . Smokeless tobacco: Never Used  . Alcohol Use: Yes     Comment: social , not everyday  . Drug Use: No  . Sexual Activity: Not on file   Other Topics Concern  .  Not on file   Social History Narrative     Current outpatient prescriptions:  .  albuterol (PROVENTIL HFA;VENTOLIN HFA) 108 (90 BASE) MCG/ACT inhaler, Inhale 2 puffs into the lungs every 6 (six) hours as needed for wheezing or shortness of breath., Disp: 1 Inhaler, Rfl: 0 .  ALPRAZolam (XANAX) 0.5 MG tablet, Take 1 tablet (0.5 mg total) by mouth 2 (two) times daily as needed for anxiety., Disp: 60 tablet, Rfl: 2 .  clopidogrel (PLAVIX) 75 MG tablet, TAKE 1 TABLET(75 MG TOTAL) BY MOUTH DAILY., Disp: 30 tablet, Rfl: 11 .  EPINEPHrine 0.3 mg/0.3 mL IJ SOAJ injection, Inject 0.3 mLs (0.3 mg total) into the muscle once., Disp: 1 Device, Rfl: 0 .  esomeprazole (NEXIUM) 40 MG capsule, Take 1 capsule (40 mg total) by mouth 2 (two) times daily before a meal., Disp: 60 capsule, Rfl: 3 .  gabapentin (NEURONTIN) 800 MG tablet, TK 1 T PO QID, Disp: , Rfl: 6 .  losartan (COZAAR) 100 MG tablet, TAKE 1 TABLET BY MOUTH EVERY DAY, Disp: 90 tablet, Rfl: 0 .  oxyCODONE-acetaminophen (PERCOCET/ROXICET)  5-325 MG tablet, 1 tablet 1 day or 1 dose. , Disp: , Rfl: 0 .  rosuvastatin (CRESTOR) 10 MG tablet, Take 1 tablet (10 mg total) by mouth daily., Disp: 30 tablet, Rfl: 2 .  tolterodine (DETROL LA) 2 MG 24 hr capsule, TK 1 C PO D, Disp: , Rfl: 11 .  venlafaxine XR (EFFEXOR-XR) 75 MG 24 hr capsule, Take 1 capsule (75 mg total) by mouth daily with breakfast., Disp: 30 capsule, Rfl: 2  Allergies  Allergen Reactions  . Vicodin [Hydrocodone-Acetaminophen] Nausea Only     Review of Systems  Constitutional: Positive for malaise/fatigue.  Gastrointestinal: Positive for nausea.  Musculoskeletal: Positive for myalgias.  Psychiatric/Behavioral: Positive for depression.    Objective  Filed Vitals:   01/08/16 1004  BP: 108/70  Pulse: 76  Temp: 98.2 F (36.8 C)  TempSrc: Oral  Resp: 15  Height: 5\' 8"  (1.727 m)  Weight: 224 lb 1.6 oz (101.651 kg)  SpO2: 96%    Physical Exam  Constitutional: She is oriented  to person, place, and time and well-developed, well-nourished, and in no distress.  Cardiovascular: Normal rate and regular rhythm.   Pulmonary/Chest: Effort normal and breath sounds normal.  Neurological: She is alert and oriented to person, place, and time.  Psychiatric: Mood, memory and judgment normal.  Nursing note and vitals reviewed.     Assessment & Plan 1. Recurrent major depressive disorder, in partial remission (HCC) Stable, and now restarted on Effexor at 75 mg daily, Another 90 day prescription provided and follow-up in 3 months. - venlafaxine XR (EFFEXOR-XR) 75 MG 24 hr capsule; Take 1 capsule (75 mg total) by mouth daily with breakfast.  Dispense: 90 capsule; Refill: 0   Robin Bryan [facility] 01/08/2016 10:26 AM

## 2016-01-08 NOTE — Telephone Encounter (Signed)
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 ALSO ASKING THAT IT BE FOR 90 DAY SUPPLY. [place]

## 2016-01-09 MED ORDER — ROSUVASTATIN CALCIUM 10 MG PO TABS
10.0000 mg | ORAL_TABLET | Freq: Every day | ORAL | Status: DC
Start: 2016-01-09 — End: 2016-04-23

## 2016-01-09 NOTE — Telephone Encounter (Signed)
Prescription for rosuvastatin has been sent to pharmacy

## 2016-02-18 ENCOUNTER — Other Ambulatory Visit: Payer: Self-pay | Admitting: Family Medicine

## 2016-03-05 ENCOUNTER — Other Ambulatory Visit: Payer: Self-pay | Admitting: Family Medicine

## 2016-04-08 ENCOUNTER — Other Ambulatory Visit: Payer: Self-pay | Admitting: Internal Medicine

## 2016-04-09 ENCOUNTER — Ambulatory Visit (INDEPENDENT_AMBULATORY_CARE_PROVIDER_SITE_OTHER): Payer: BLUE CROSS/BLUE SHIELD | Admitting: Family Medicine

## 2016-04-09 ENCOUNTER — Other Ambulatory Visit: Payer: Self-pay | Admitting: Family Medicine

## 2016-04-09 ENCOUNTER — Encounter: Payer: Self-pay | Admitting: Family Medicine

## 2016-04-09 VITALS — BP 112/68 | HR 81 | Temp 97.8°F | Resp 18 | Ht 68.0 in | Wt 224.6 lb

## 2016-04-09 DIAGNOSIS — N3001 Acute cystitis with hematuria: Secondary | ICD-10-CM

## 2016-04-09 DIAGNOSIS — F3342 Major depressive disorder, recurrent, in full remission: Secondary | ICD-10-CM

## 2016-04-09 LAB — POCT URINALYSIS DIPSTICK
Glucose, UA: NEGATIVE
KETONES UA: NEGATIVE
Leukocytes, UA: NEGATIVE
Nitrite, UA: NEGATIVE
PROTEIN UA: NEGATIVE
SPEC GRAV UA: 1.02
UROBILINOGEN UA: 0.2
pH, UA: 5

## 2016-04-09 MED ORDER — VENLAFAXINE HCL ER 75 MG PO CP24: 75.0000 mg | ORAL_CAPSULE | Freq: Every day | ORAL | 0 refills | Status: DC

## 2016-04-09 MED ORDER — NITROFURANTOIN MACROCRYSTAL 100 MG PO CAPS: 100.0000 mg | ORAL_CAPSULE | Freq: Two times a day (BID) | ORAL | 0 refills | Status: DC

## 2016-04-09 NOTE — Progress Notes (Signed)
Name: Robin Bryan   MRN: [id number]    DOB: 07/14/1951   Date:04/09/2016       Progress Note  Subjective  Chief Complaint  Chief Complaint  Patient presents with  . Follow-up    3 mo\ possible UTI   Urinary Tract Infection   This is a new problem. The problem has been unchanged. The pain is at a severity of 0/10. The patient is experiencing no pain. There has been no fever. Associated symptoms include hematuria and urgency (pt. has urinary incontinence, takes Detrol LA by Urology.). Pertinent negatives include no chills or flank pain. She has tried nothing for the symptoms.    Past Medical History:  Diagnosis Date  . Anxiety   . Arthritis   . Asthma   . Barrett's esophagus   . Bronchitis   . Depression   . Diverticulosis   . Esophageal stricture   . Fibromyalgia   . GERD (gastroesophageal reflux disease)   . Hx of adenomatous colonic polyps   . Hyperlipidemia   . Hypertension   . Intramural leiomyoma of uterus   . Myocardial infarction ([place])   . Stroke [facility])     Past Surgical History:  Procedure Laterality Date  . ABDOMINAL HYSTERECTOMY    . INCONTINENCE SURGERY    . TUBAL LIGATION      Family History  Problem Relation Age of Onset  . Ovarian cancer Sister   . Cancer Sister   . Diabetes Sister   . Heart disease Sister   . Lung cancer Sister   . Colon polyps Mother   . Diabetes Mother   . Parkinson's disease Cousin   . Cancer Father     lung  . Diabetes Daughter     Social History   Social History  . Marital status: Married    Spouse name: N/A  . Number of children: N/A  . Years of education: N/A   Occupational History  . Not on file.   Social History Main Topics  . Smoking status: Former Smoker    Packs/day: 0.50    Years: 8.00    Types: Cigarettes    Quit date: 05/27/2011  . Smokeless tobacco: Never Used  . Alcohol use Yes     Comment: social , not everyday  . Drug use: No  . Sexual activity: Not on file   Other Topics Concern  . Not on  file   Social History Narrative  . No narrative on file     Current Outpatient Prescriptions:  .  albuterol (PROVENTIL HFA;VENTOLIN HFA) 108 (90 BASE) MCG/ACT inhaler, Inhale 2 puffs into the lungs every 6 (six) hours as needed for wheezing or shortness of breath., Disp: 1 Inhaler, Rfl: 0 .  ALPRAZolam (XANAX) 0.5 MG tablet, Take 1 tablet (0.5 mg total) by mouth 2 (two) times daily as needed for anxiety., Disp: 60 tablet, Rfl: 2 .  clopidogrel (PLAVIX) 75 MG tablet, TAKE 1 TABLET(75 MG TOTAL) BY MOUTH DAILY., Disp: 30 tablet, Rfl: 11 .  EPINEPHrine 0.3 mg/0.3 mL IJ SOAJ injection, Inject 0.3 mLs (0.3 mg total) into the muscle once., Disp: 1 Device, Rfl: 0 .  esomeprazole (NEXIUM) 40 MG capsule, TAKE 1 CAPSULE(40 MG) BY MOUTH TWICE DAILY BEFORE A MEAL, Disp: 60 capsule, Rfl: 0 .  gabapentin (NEURONTIN) 800 MG tablet, TK 1 T PO QID, Disp: , Rfl: 6 .  losartan (COZAAR) 100 MG tablet, TAKE 1 TABLET BY MOUTH EVERY DAY, Disp: 90 tablet, Rfl: 0 .  oxyCODONE-acetaminophen (PERCOCET/ROXICET) 5-325 MG tablet, 1 tablet 1 day or 1 dose. , Disp: , Rfl: 0 .  rosuvastatin (CRESTOR) 10 MG tablet, Take 1 tablet (10 mg total) by mouth daily., Disp: 30 tablet, Rfl: 2 .  tolterodine (DETROL LA) 2 MG 24 hr capsule, TK 1 C PO D, Disp: , Rfl: 11 .  venlafaxine XR (EFFEXOR-XR) 75 MG 24 hr capsule, Take 1 capsule (75 mg total) by mouth daily with breakfast., Disp: 90 capsule, Rfl: 0  Allergies  Allergen Reactions  . Vicodin [Hydrocodone-Acetaminophen] Nausea Only     Review of Systems  Constitutional: Negative for chills.  Genitourinary: Positive for hematuria and urgency (pt. has urinary incontinence, takes Detrol LA by Urology.). Negative for flank pain.  Psychiatric/Behavioral: Positive for depression.    Objective  Vitals:   04/09/16 1106  BP: 112/68  Pulse: 81  Resp: 18  Temp: 97.8 F (36.6 C)  TempSrc: Oral  SpO2: 96%  Weight: 224 lb 9.6 oz (101.9 kg)  Height: 5\' 8"  (1.727 m)    Physical  Exam  Constitutional: She is oriented to person, place, and time and well-developed, well-nourished, and in no distress.  HENT:  Head: Normocephalic and atraumatic.  Cardiovascular: Normal rate, regular rhythm and normal heart sounds.   No murmur heard. Pulmonary/Chest: Effort normal and breath sounds normal. She has no wheezes.  Abdominal: Soft. There is tenderness in the suprapubic area. There is CVA tenderness (right sided CVA tenderness).    Neurological: She is alert and oriented to person, place, and time.  Psychiatric: Mood, memory, affect and judgment normal.  Nursing note and vitals reviewed.    Assessment & Plan  1. Recurrent major depressive disorder, in full remission ([place])  Continue on Effexor, symptoms stable - venlafaxine XR (EFFEXOR-XR) 75 MG 24 hr capsule; Take 1 capsule (75 mg total) by mouth daily with breakfast.  Dispense: 90 capsule; Refill: 0  2. Acute cystitis with hematuria Point-of-care urinalysis shows moderate amount of blood, symptoms consistent with up probable UTI. We'll start an antibiotic therapy and sent for culture. - nitrofurantoin (MACRODANTIN) 100 MG capsule; Take 1 capsule (100 mg total) by mouth 2 (two) times daily.  Dispense: 14 capsule; Refill: 0 - Urinalysis, Routine w reflex microscopic - Urine Culture - POCT Urinalysis Dipstick   Solash Tullo [facility] 04/09/2016 11:52 AM

## 2016-04-10 LAB — URINALYSIS, MICROSCOPIC ONLY
BACTERIA UA: NONE SEEN [HPF]
CASTS: NONE SEEN [LPF]
CRYSTALS: NONE SEEN [HPF]
Yeast: NONE SEEN [HPF]

## 2016-04-10 LAB — URINALYSIS, ROUTINE W REFLEX MICROSCOPIC
Bilirubin Urine: NEGATIVE
Glucose, UA: NEGATIVE
Hgb urine dipstick: NEGATIVE
KETONES UR: NEGATIVE
NITRITE: NEGATIVE
PH: 6 (ref 5.0–8.0)
Protein, ur: NEGATIVE
SPECIFIC GRAVITY, URINE: 1.024 (ref 1.001–1.035)

## 2016-04-11 LAB — URINE CULTURE: Organism ID, Bacteria: 10000

## 2016-04-12 ENCOUNTER — Other Ambulatory Visit: Payer: Self-pay | Admitting: Family Medicine

## 2016-04-12 DIAGNOSIS — E785 Hyperlipidemia, unspecified: Secondary | ICD-10-CM

## 2016-04-16 ENCOUNTER — Other Ambulatory Visit: Payer: Self-pay | Admitting: Emergency Medicine

## 2016-04-16 DIAGNOSIS — R319 Hematuria, unspecified: Secondary | ICD-10-CM

## 2016-04-18 ENCOUNTER — Other Ambulatory Visit: Payer: Self-pay

## 2016-04-18 DIAGNOSIS — E785 Hyperlipidemia, unspecified: Secondary | ICD-10-CM

## 2016-04-18 LAB — URINE CULTURE: Organism ID, Bacteria: NO GROWTH

## 2016-04-18 NOTE — Telephone Encounter (Signed)
Pt needs appt for refill  

## 2016-04-21 NOTE — Telephone Encounter (Signed)
Left message for pt to call and schedule an appt

## 2016-04-23 ENCOUNTER — Ambulatory Visit (INDEPENDENT_AMBULATORY_CARE_PROVIDER_SITE_OTHER): Payer: BLUE CROSS/BLUE SHIELD | Admitting: Family Medicine

## 2016-04-23 ENCOUNTER — Encounter: Payer: Self-pay | Admitting: Family Medicine

## 2016-04-23 VITALS — BP 128/80 | HR 96 | Temp 98.6°F | Resp 16 | Ht 68.0 in | Wt 231.6 lb

## 2016-04-23 DIAGNOSIS — R7309 Other abnormal glucose: Secondary | ICD-10-CM

## 2016-04-23 DIAGNOSIS — E785 Hyperlipidemia, unspecified: Secondary | ICD-10-CM

## 2016-04-23 DIAGNOSIS — F410 Panic disorder [episodic paroxysmal anxiety] without agoraphobia: Secondary | ICD-10-CM

## 2016-04-23 MED ORDER — ALPRAZOLAM 0.5 MG PO TABS: 0.5000 mg | ORAL_TABLET | Freq: Two times a day (BID) | ORAL | 2 refills | Status: DC | PRN

## 2016-04-23 MED ORDER — ROSUVASTATIN CALCIUM 10 MG PO TABS: 10.0000 mg | ORAL_TABLET | Freq: Every day | ORAL | 0 refills | Status: DC

## 2016-04-23 NOTE — Progress Notes (Signed)
Name: Robin Bryan   MRN: [id number]    DOB: Dec 23, 1950   Date:04/23/2016       Progress Note  Subjective  Chief Complaint  Chief Complaint  Patient presents with  . Hyperlipidemia    follow up, medication refills  . Depression  . Anxiety    Hyperlipidemia  This is a chronic problem. The problem is controlled. Recent lipid tests were reviewed and are normal. Pertinent negatives include no leg pain, myalgias or shortness of breath. Current antihyperlipidemic treatment includes statins.  Anxiety  Presents for follow-up visit. Symptoms include confusion, depressed mood, insomnia and irritability. Patient reports no excessive worry, nervous/anxious behavior, panic or shortness of breath. The severity of symptoms is moderate. The quality of sleep is poor.       Past Medical History:  Diagnosis Date  . Anxiety   . Arthritis   . Asthma   . Barrett's esophagus   . Bronchitis   . Depression   . Diverticulosis   . Esophageal stricture   . Fibromyalgia   . GERD (gastroesophageal reflux disease)   . Hx of adenomatous colonic polyps   . Hyperlipidemia   . Hypertension   . Intramural leiomyoma of uterus   . Myocardial infarction ([place])   . Stroke [facility])     Past Surgical History:  Procedure Laterality Date  . ABDOMINAL HYSTERECTOMY    . INCONTINENCE SURGERY    . TUBAL LIGATION      Family History  Problem Relation Age of Onset  . Ovarian cancer Sister   . Cancer Sister   . Diabetes Sister   . Heart disease Sister   . Lung cancer Sister   . Colon polyps Mother   . Diabetes Mother   . Parkinson's disease Cousin   . Cancer Father     lung  . Diabetes Daughter     Social History   Social History  . Marital status: Married    Spouse name: N/A  . Number of children: N/A  . Years of education: N/A   Occupational History  . Not on file.   Social History Main Topics  . Smoking status: Former Smoker    Packs/day: 0.50    Years: 8.00    Types: Cigarettes    Quit  date: 05/27/2011  . Smokeless tobacco: Never Used  . Alcohol use Yes     Comment: social , not everyday  . Drug use: No  . Sexual activity: Not on file   Other Topics Concern  . Not on file   Social History Narrative  . No narrative on file     Current Outpatient Prescriptions:  .  albuterol (PROVENTIL HFA;VENTOLIN HFA) 108 (90 BASE) MCG/ACT inhaler, Inhale 2 puffs into the lungs every 6 (six) hours as needed for wheezing or shortness of breath., Disp: 1 Inhaler, Rfl: 0 .  ALPRAZolam (XANAX) 0.5 MG tablet, Take 1 tablet (0.5 mg total) by mouth 2 (two) times daily as needed for anxiety., Disp: 60 tablet, Rfl: 2 .  clopidogrel (PLAVIX) 75 MG tablet, TAKE 1 TABLET(75 MG TOTAL) BY MOUTH DAILY., Disp: 30 tablet, Rfl: 11 .  EPINEPHrine 0.3 mg/0.3 mL IJ SOAJ injection, Inject 0.3 mLs (0.3 mg total) into the muscle once., Disp: 1 Device, Rfl: 0 .  esomeprazole (NEXIUM) 40 MG capsule, TAKE 1 CAPSULE(40 MG) BY MOUTH TWICE DAILY BEFORE A MEAL, Disp: 60 capsule, Rfl: 0 .  gabapentin (NEURONTIN) 800 MG tablet, TK 1 T PO QID, Disp: , Rfl: 6 .  losartan (COZAAR) 100 MG tablet, TAKE 1 TABLET BY MOUTH EVERY DAY, Disp: 90 tablet, Rfl: 0 .  rosuvastatin (CRESTOR) 10 MG tablet, Take 1 tablet (10 mg total) by mouth daily., Disp: 30 tablet, Rfl: 2 .  tolterodine (DETROL LA) 2 MG 24 hr capsule, TK 1 C PO D, Disp: , Rfl: 11 .  venlafaxine XR (EFFEXOR-XR) 75 MG 24 hr capsule, Take 1 capsule (75 mg total) by mouth daily with breakfast., Disp: 90 capsule, Rfl: 0 .  nitrofurantoin (MACRODANTIN) 100 MG capsule, Take 1 capsule (100 mg total) by mouth 2 (two) times daily. (Patient not taking: Reported on 04/23/2016), Disp: 14 capsule, Rfl: 0 .  oxyCODONE-acetaminophen (PERCOCET/ROXICET) 5-325 MG tablet, 1 tablet 1 day or 1 dose. , Disp: , Rfl: 0  Allergies  Allergen Reactions  . Vicodin [Hydrocodone-Acetaminophen] Nausea Only     Review of Systems  Constitutional: Positive for irritability.  Respiratory:  Negative for shortness of breath.   Musculoskeletal: Negative for myalgias.  Psychiatric/Behavioral: Positive for confusion. The patient has insomnia. The patient is not nervous/anxious.       Objective  Vitals:   04/23/16 1100  BP: 128/80  Pulse: 96  Resp: 16  Temp: 98.6 F (37 C)  TempSrc: Oral  SpO2: 95%  Weight: 231 lb 9.6 oz (105.1 kg)  Height: 5\' 8"  (1.727 m)    Physical Exam  Constitutional: She is oriented to person, place, and time and well-developed, well-nourished, and in no distress.  HENT:  Head: Normocephalic and atraumatic.  Cardiovascular: Normal rate, regular rhythm and normal heart sounds.   No murmur heard. Pulmonary/Chest: Effort normal and breath sounds normal. She has no wheezes.  Abdominal: Soft. Bowel sounds are normal. There is no tenderness.  Neurological: She is alert and oriented to person, place, and time.  Psychiatric: Mood, memory, affect and judgment normal.  Nursing note and vitals reviewed.    Assessment & Plan  1. Hyperlipidemia  - rosuvastatin (CRESTOR) 10 MG tablet; Take 1 tablet (10 mg total) by mouth daily.  Dispense: 90 tablet; Refill: 0 - Lipid Profile - COMPLETE METABOLIC PANEL WITH GFR  2. Panic disorder without agoraphobia  - ALPRAZolam (XANAX) 0.5 MG tablet; Take 1 tablet (0.5 mg total) by mouth 2 (two) times daily as needed for anxiety.  Dispense: 60 tablet; Refill: 2  3. Elevated hemoglobin A1c A1c is 6.4%, considered prediabetes. - POCT HgB A1C  Kyli Sorter [facility] 04/23/2016 11:06 AM

## 2016-04-24 LAB — POCT GLYCOSYLATED HEMOGLOBIN (HGB A1C): Hemoglobin A1C: 6.4

## 2016-04-28 ENCOUNTER — Other Ambulatory Visit: Payer: Self-pay | Admitting: Family Medicine

## 2016-04-29 LAB — LIPID PANEL WITH LDL/HDL RATIO
Cholesterol, Total: 127 mg/dL (ref 100–199)
HDL: 37 mg/dL — ABNORMAL LOW (ref >39)
LDL CALC: 61 mg/dL (ref 0–99)
LDl/HDL Ratio: 1.6 ratio units (ref 0.0–3.2)
Triglycerides: 146 mg/dL (ref 0–149)
VLDL CHOLESTEROL CAL: 29 mg/dL (ref 5–40)

## 2016-04-29 LAB — COMPREHENSIVE METABOLIC PANEL
ALBUMIN: 4.2 g/dL (ref 3.6–4.8)
ALK PHOS: 75 IU/L (ref 39–117)
ALT: 26 IU/L (ref 0–32)
AST: 21 IU/L (ref 0–40)
Albumin/Globulin Ratio: 1.8 (ref 1.2–2.2)
BILIRUBIN TOTAL: 0.5 mg/dL (ref 0.0–1.2)
BUN / CREAT RATIO: 18 (ref 12–28)
BUN: 13 mg/dL (ref 8–27)
CALCIUM: 9.1 mg/dL (ref 8.7–10.3)
CHLORIDE: 102 mmol/L (ref 96–106)
CO2: 26 mmol/L (ref 18–29)
CREATININE: 0.71 mg/dL (ref 0.57–1.00)
GFR calc non Af Amer: 91 mL/min/{1.73_m2} (ref >59)
GFR, EST AFRICAN AMERICAN: 105 mL/min/{1.73_m2} (ref >59)
GLOBULIN, TOTAL: 2.3 g/dL (ref 1.5–4.5)
GLUCOSE: 113 mg/dL — AB (ref 65–99)
Potassium: 4.5 mmol/L (ref 3.5–5.2)
SODIUM: 143 mmol/L (ref 134–144)
TOTAL PROTEIN: 6.5 g/dL (ref 6.0–8.5)

## 2016-05-07 ENCOUNTER — Other Ambulatory Visit: Payer: Self-pay | Admitting: Internal Medicine

## 2016-05-23 ENCOUNTER — Other Ambulatory Visit: Payer: Self-pay | Admitting: Family Medicine

## 2016-05-27 ENCOUNTER — Ambulatory Visit (INDEPENDENT_AMBULATORY_CARE_PROVIDER_SITE_OTHER): Payer: BLUE CROSS/BLUE SHIELD | Admitting: Podiatry

## 2016-05-27 ENCOUNTER — Encounter: Payer: Self-pay | Admitting: Podiatry

## 2016-05-27 VITALS — BP 123/75 | HR 83 | Resp 16 | Ht 68.0 in | Wt 230.0 lb

## 2016-05-27 DIAGNOSIS — M79671 Pain in right foot: Secondary | ICD-10-CM

## 2016-05-27 DIAGNOSIS — M79675 Pain in left toe(s): Secondary | ICD-10-CM

## 2016-05-27 DIAGNOSIS — L84 Corns and callosities: Secondary | ICD-10-CM

## 2016-05-27 DIAGNOSIS — B353 Tinea pedis: Secondary | ICD-10-CM | POA: Diagnosis not present

## 2016-05-27 MED ORDER — CLOTRIMAZOLE-BETAMETHASONE 1-0.05 % EX CREA: 1.0000 "application " | TOPICAL_CREAM | Freq: Two times a day (BID) | CUTANEOUS | 2 refills | Status: DC

## 2016-05-27 MED ORDER — TERBINAFINE HCL 250 MG PO TABS: 250.0000 mg | ORAL_TABLET | Freq: Every day | ORAL | 0 refills | Status: DC

## 2016-05-27 NOTE — Progress Notes (Signed)
   Subjective:    Patient ID: Robin Bryan, female    DOB: Feb 16, 1951, 65 y.o.   MRN: [id number]  HPI    Review of Systems  All other systems reviewed and are negative.      Objective:   Physical Exam        Assessment & Plan:

## 2016-05-27 NOTE — Progress Notes (Signed)
Patient ID: Robin Bryan, female   DOB: Jan 05, 1951, 65 y.o.   MRN: [id number] Subjective: 65 year old female presents today to the office for evaluation of a painful corn to the fifth digit left foot. Patient also states that she's had itching on her right foot for the past 2 months as well. Patient states that the itching developed a blister patient which time she did pop it however she continues to have itching.   Objective: Physical Exam General: The patient is alert and oriented x3 in no acute distress.  Dermatology: Pruritic, hyperkeratotic, peeling skin noted to the right lateral forefoot interdigital areas between 4 and 5. Callus lesion noted to the left fifth digit interdigital corn fourth webspace. Skin is warm, dry and supple bilateral lower extremities. Negative for open lesions or macerations.  Vascular: Palpable pedal pulses bilaterally. No edema or erythema noted. Capillary refill within normal limits.  Neurological: Epicritic and protective threshold grossly intact bilaterally.   Musculoskeletal Exam: Range of motion within normal limits to all pedal and ankle joints bilateral. Muscle strength 5/5 in all groups bilateral.    Assessment: #1 tinea pedis right foot #2 digital corn fifth digit left foot #3 pain in right foot #4 pain in toe left foot  Problem List Items Addressed This Visit    None    Visit Diagnoses   None.       Plan of Care:  #1 Patient was evaluated. #2 Today a prescription for Lotrisone cream as well as Lamisil 28 days was dispensed to the patient. #3 excisional debridement of the hyperkeratotic corn lesions to the left fifth digit was performed using a chisel blade without incident. #4 patient is to return to clinic in 4 weeks     Dr. Edrick Kins, [place]

## 2016-06-12 IMAGING — CT CT ABD-PELV W/ CM
2 of 5 series · 16 of 46 positions shown, 18 images · IV contrast (Omnipaque 300)
Comparison: Abdomen only CT and MRI on 11/17/2006 and 02/12/2007

CLINICAL DATA: Lower abdominal and pelvic pain. Gastroesophageal
reflux disease.

EXAM:
CT ABDOMEN AND PELVIS WITH CONTRAST
TECHNIQUE: Multidetector CT imaging of the abdomen and pelvis was performed
using the standard protocol following bolus administration of
intravenous contrast.
CONTRAST:  80mL OMNIPAQUE IOHEXOL 300 MG/ML  SOLN

[Series 2: abd/ pel 5mm · axial · 0.79mm/px · z∈[-496,-62]mm · 13 of 99 slices shown, 15 images]
[im 6/99  soft-tissue]
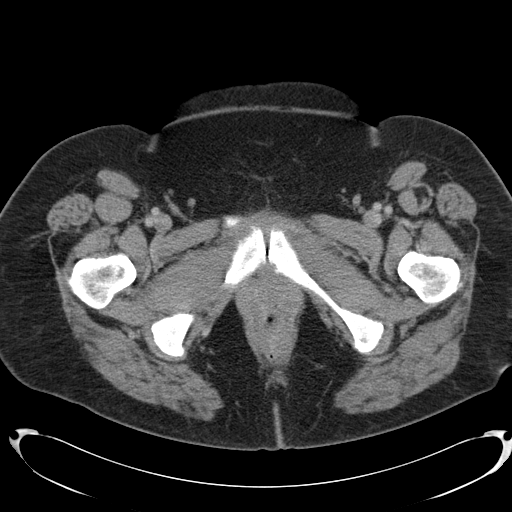
[im 6/99  bone]
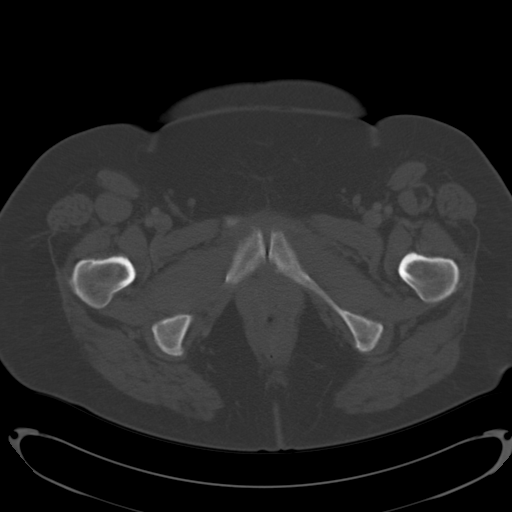
[im 16/99  soft-tissue]
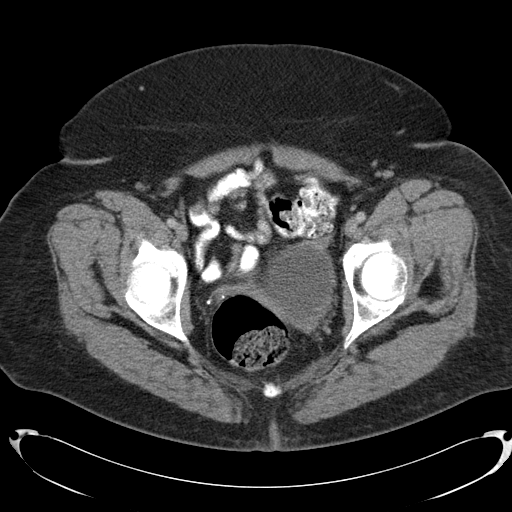
[im 21/99  soft-tissue]
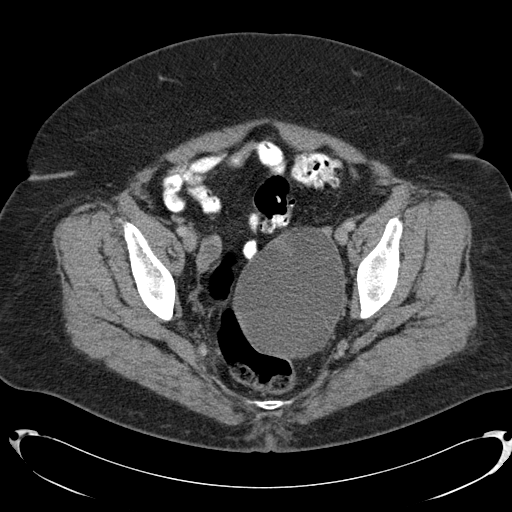
[im 26/99  soft-tissue]
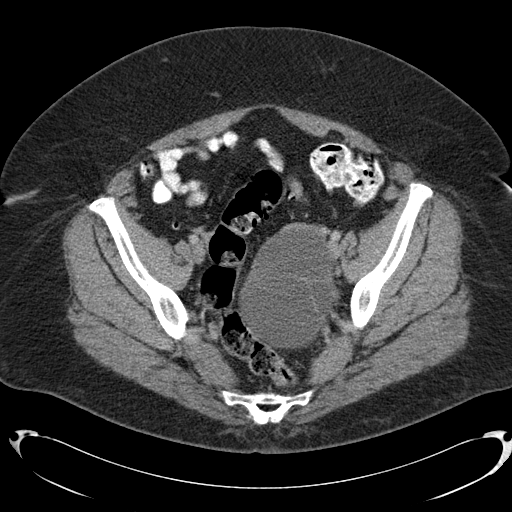
[im 37/99  soft-tissue]
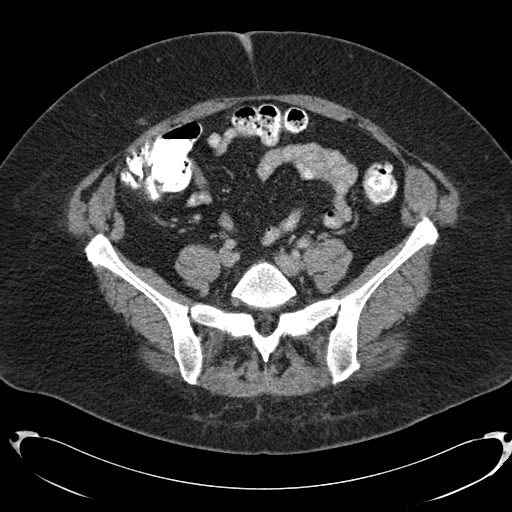
[im 42/99  soft-tissue]
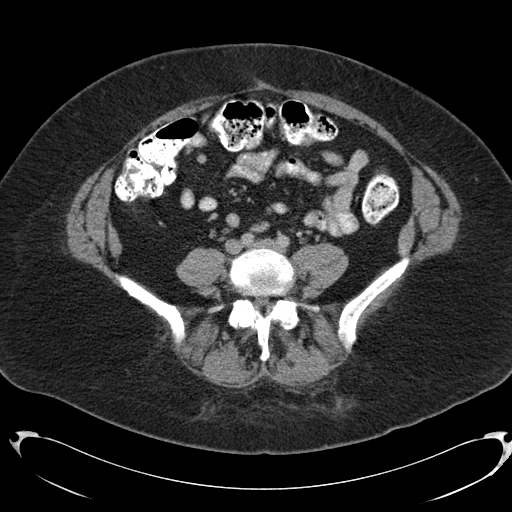
[im 52/99  soft-tissue]
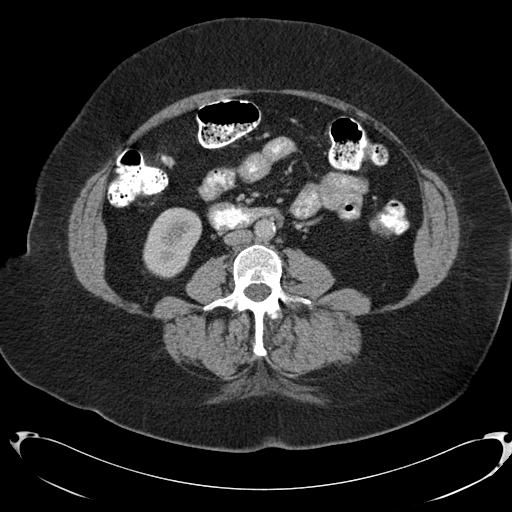
[im 57/99  soft-tissue]
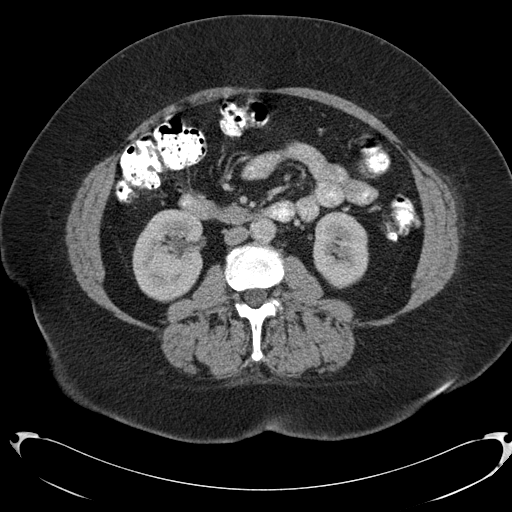
[im 62/99  soft-tissue]
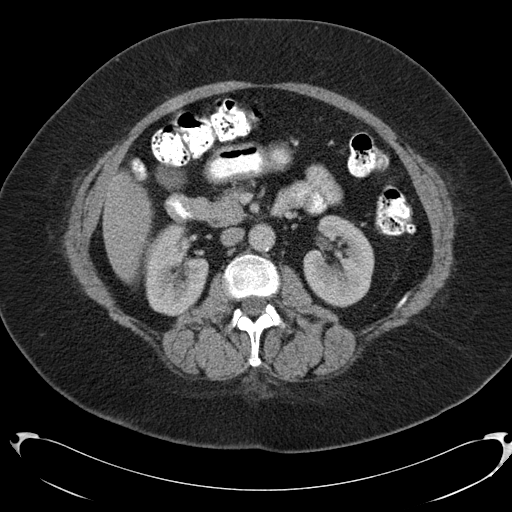
[im 62/99  bone]
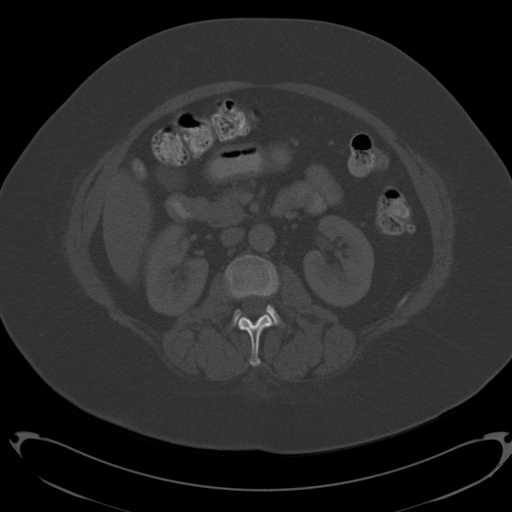
[im 73/99  soft-tissue]
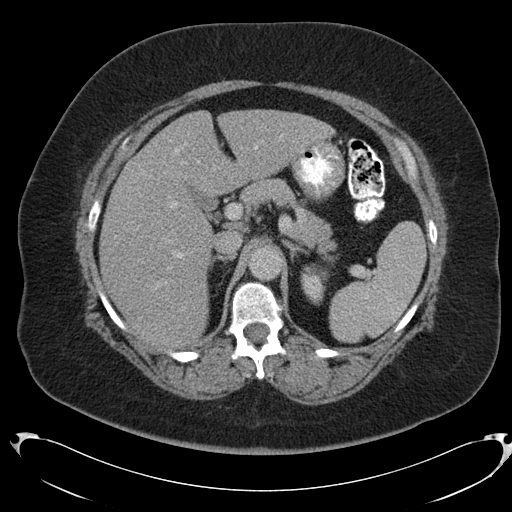
[im 78/99  soft-tissue]
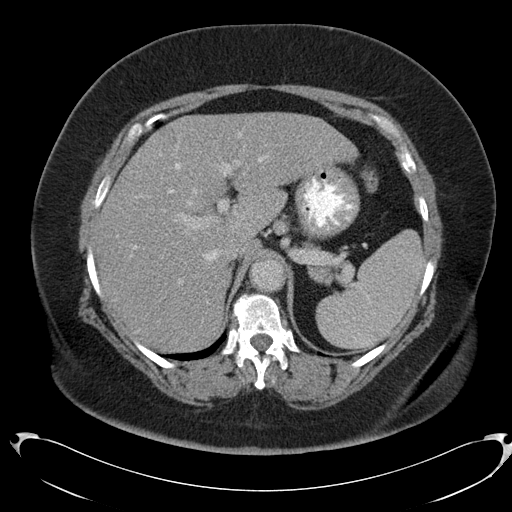
[im 83/99  soft-tissue]
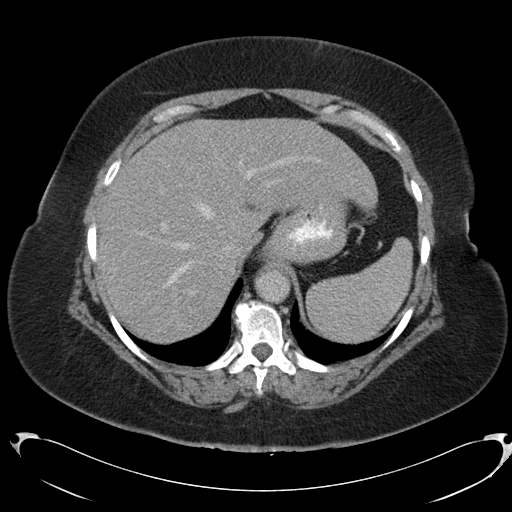
[im 93/99  soft-tissue]
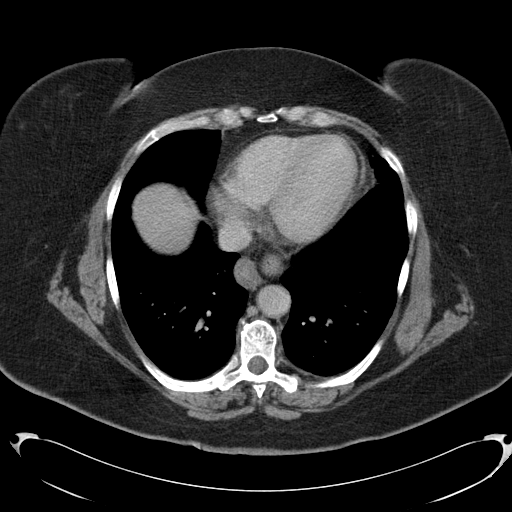

[Series 602: cor · coronal · 0.99mm/px · 3 of 159 slices shown]
[im 53/159  soft-tissue]
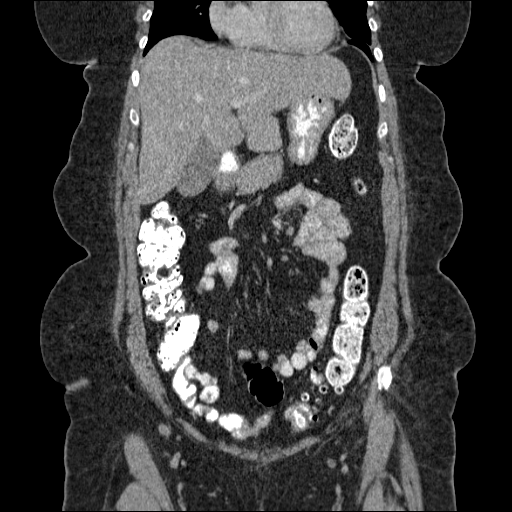
[im 71/159  soft-tissue]
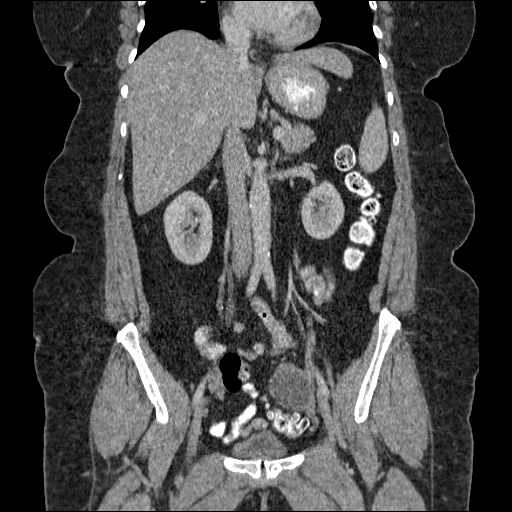
[im 88/159  soft-tissue]
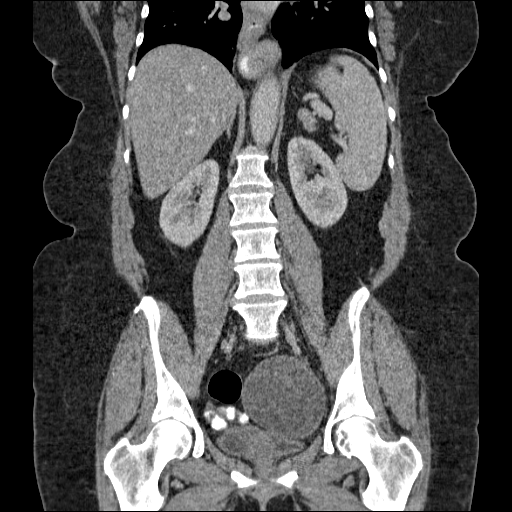

[16 of 46 positions shown; findings below may reference images not displayed]

FINDINGS: Lower Chest:  Visualized portion unremarkable.

Hepatobiliary: Previously characterized 2.5 cm hemangioma in the
posterior right hepatic lobe remains stable. No new or enlarging
liver masses are identified.

Pancreas: No mass, inflammatory changes, or other parenchymal
abnormality identified.

Spleen:  Within normal limits in size and appearance.

Adrenal Glands:  No mass identified.

Kidneys/Urinary Tract: No masses identified. No evidence of
hydronephrosis.

Stomach/Bowel/Peritoneum: Small to moderate hiatal hernia noted. No
evidence of bowel wall thickening, mass, or obstruction.
Diverticulosis is seen mainly involving the sigmoid colon. No
evidence of diverticulitis or other inflammatory process. Normal
appendix visualized.

Lymph Nodes:  No pathologically enlarged lymph nodes identified.

Reproductive: Hysterectomy noted. A complex cystic lesion containing
at least a few thin internal septations is seen in the left adnexa.
This measures 8.5 x 10.5 cm and is most consistent with a cystic
ovarian neoplasm. No definite malignant characteristics visualized
by CT. No evidence of ascites

Vascular:  No evidence of abdominal aortic aneurysm.

Musculoskeletal:  No suspicious bone lesions identified.

Other:  None.
IMPRESSION: 10 cm complex cystic lesion in left adnexa, highly suspicious for
cystic ovarian neoplasm. Consider surgical evaluation and/or further
preoperative characterization with pelvic MRI without and with
contrast.

No evidence of metastatic disease or ascites.

Diverticulosis. No radiographic evidence of diverticulitis.

Small to moderate hiatal hernia.

Stable benign hepatic hemangioma.

## 2016-07-08 ENCOUNTER — Ambulatory Visit: Payer: BLUE CROSS/BLUE SHIELD | Admitting: Podiatry

## 2016-07-22 ENCOUNTER — Other Ambulatory Visit: Payer: Self-pay | Admitting: Family Medicine

## 2016-07-22 DIAGNOSIS — E785 Hyperlipidemia, unspecified: Secondary | ICD-10-CM

## 2016-08-12 ENCOUNTER — Ambulatory Visit: Payer: BLUE CROSS/BLUE SHIELD | Admitting: Family Medicine

## 2016-08-14 ENCOUNTER — Ambulatory Visit (INDEPENDENT_AMBULATORY_CARE_PROVIDER_SITE_OTHER): Payer: Medicare Other | Admitting: Family Medicine

## 2016-08-14 ENCOUNTER — Encounter: Payer: Self-pay | Admitting: Family Medicine

## 2016-08-14 DIAGNOSIS — J0100 Acute maxillary sinusitis, unspecified: Secondary | ICD-10-CM | POA: Insufficient documentation

## 2016-08-14 MED ORDER — AZITHROMYCIN 250 MG PO TABS: ORAL_TABLET | ORAL | 0 refills | Status: DC

## 2016-08-14 NOTE — Progress Notes (Signed)
Name: Robin Bryan   MRN: [id number]    DOB: Jan 08, 1951   Date:08/14/2016       Progress Note  Subjective  Chief Complaint  Chief Complaint  Patient presents with  . URI    cough, congested, chest hurts, yellow/green phelgm for 2 weeks    Cough  This is a new problem. The current episode started 1 to 4 weeks ago (2 weeks ago). The problem has been unchanged. The cough is productive of sputum. Associated symptoms include chest pain, chills, a fever (she has had some fever and sweating at home, normal temperature in office), headaches, a sore throat, shortness of breath and wheezing. She has tried OTC cough suppressant for the symptoms.     Past Medical History:  Diagnosis Date  . Anxiety   . Arthritis   . Asthma   . Barrett's esophagus   . Bronchitis   . Depression   . Diverticulosis   . Esophageal stricture   . Fibromyalgia   . GERD (gastroesophageal reflux disease)   . Hx of adenomatous colonic polyps   . Hyperlipidemia   . Hypertension   . Intramural leiomyoma of uterus   . Myocardial infarction   . Stroke [facility])     Past Surgical History:  Procedure Laterality Date  . ABDOMINAL HYSTERECTOMY    . INCONTINENCE SURGERY    . TUBAL LIGATION      Family History  Problem Relation Age of Onset  . Ovarian cancer Sister   . Cancer Sister   . Diabetes Sister   . Heart disease Sister   . Lung cancer Sister   . Colon polyps Mother   . Diabetes Mother   . Parkinson's disease Cousin   . Cancer Father     lung  . Diabetes Daughter     Social History   Social History  . Marital status: Married    Spouse name: N/A  . Number of children: N/A  . Years of education: N/A   Occupational History  . Not on file.   Social History Main Topics  . Smoking status: Former Smoker    Packs/day: 0.50    Years: 8.00    Types: Cigarettes    Quit date: 05/27/2011  . Smokeless tobacco: Never Used  . Alcohol use Yes     Comment: social , not everyday  . Drug use: No  .  Sexual activity: Not on file   Other Topics Concern  . Not on file   Social History Narrative  . No narrative on file     Current Outpatient Prescriptions:  .  albuterol (PROVENTIL HFA;VENTOLIN HFA) 108 (90 BASE) MCG/ACT inhaler, Inhale 2 puffs into the lungs every 6 (six) hours as needed for wheezing or shortness of breath., Disp: 1 Inhaler, Rfl: 0 .  ALPRAZolam (XANAX) 0.5 MG tablet, Take 1 tablet (0.5 mg total) by mouth 2 (two) times daily as needed for anxiety., Disp: 60 tablet, Rfl: 2 .  clopidogrel (PLAVIX) 75 MG tablet, TAKE 1 TABLET(75 MG TOTAL) BY MOUTH DAILY., Disp: 30 tablet, Rfl: 11 .  clotrimazole-betamethasone (LOTRISONE) cream, Apply 1 application topically 2 (two) times daily., Disp: 60 g, Rfl: 2 .  EPINEPHrine 0.3 mg/0.3 mL IJ SOAJ injection, Inject 0.3 mLs (0.3 mg total) into the muscle once., Disp: 1 Device, Rfl: 0 .  esomeprazole (NEXIUM) 40 MG capsule, TAKE 1 CAPSULE(40 MG) BY MOUTH TWICE DAILY BEFORE A MEAL, Disp: 60 capsule, Rfl: 6 .  gabapentin (NEURONTIN) 800 MG tablet, TK  1 T PO QID, Disp: , Rfl: 6 .  losartan (COZAAR) 100 MG tablet, TAKE 1 TABLET BY MOUTH EVERY DAY, Disp: 90 tablet, Rfl: 0 .  nitrofurantoin (MACRODANTIN) 100 MG capsule, Take 1 capsule (100 mg total) by mouth 2 (two) times daily., Disp: 14 capsule, Rfl: 0 .  oxyCODONE-acetaminophen (PERCOCET/ROXICET) 5-325 MG tablet, 1 tablet 1 day or 1 dose. , Disp: , Rfl: 0 .  rosuvastatin (CRESTOR) 10 MG tablet, TAKE 1 TABLET(10 MG) BY MOUTH DAILY, Disp: 90 tablet, Rfl: 0 .  terbinafine (LAMISIL) 250 MG tablet, Take 1 tablet (250 mg total) by mouth daily., Disp: 28 tablet, Rfl: 0 .  tolterodine (DETROL LA) 2 MG 24 hr capsule, TK 1 C PO D, Disp: , Rfl: 11 .  venlafaxine XR (EFFEXOR-XR) 75 MG 24 hr capsule, Take 1 capsule (75 mg total) by mouth daily with breakfast., Disp: 90 capsule, Rfl: 0  Allergies  Allergen Reactions  . Vicodin [Hydrocodone-Acetaminophen] Nausea Only     Review of Systems   Constitutional: Positive for chills and fever (she has had some fever and sweating at home, normal temperature in office).  HENT: Positive for sore throat.   Respiratory: Positive for cough, shortness of breath and wheezing.   Cardiovascular: Positive for chest pain.  Neurological: Positive for headaches.     Objective  Vitals:   08/14/16 1429  BP: 112/70  Pulse: 86  Resp: 16  Temp: 98.4 F (36.9 C)  TempSrc: Oral  SpO2: (!) 86%  Weight: 234 lb 6.4 oz (106.3 kg)  Height: 5\' 8"  (1.727 m)    Physical Exam  Constitutional: She is oriented to person, place, and time and well-developed, well-nourished, and in no distress.  HENT:  Right Ear: Tympanic membrane and ear canal normal. No drainage.  Left Ear: Tympanic membrane and ear canal normal. No drainage.  Nose: Right sinus exhibits maxillary sinus tenderness. Right sinus exhibits no frontal sinus tenderness. Left sinus exhibits maxillary sinus tenderness. Left sinus exhibits no frontal sinus tenderness.  Mouth/Throat: Posterior oropharyngeal erythema present.  Cardiovascular: Normal rate, regular rhythm and normal heart sounds.   No murmur heard. Pulmonary/Chest: Effort normal and breath sounds normal. She has no wheezes.  Neurological: She is alert and oriented to person, place, and time.  Nursing note and vitals reviewed.      Assessment & Plan  1. Acute non-recurrent maxillary sinusitis Started on antibiotic therapy for relief - azithromycin (ZITHROMAX) 250 MG tablet; 2 tabs po day 1, then 1 tab po q day x 4 days  Dispense: 6 tablet; Refill: 0   Dawson Albers [facility] 08/14/2016 2:48 PM

## 2016-08-21 ENCOUNTER — Other Ambulatory Visit: Payer: Self-pay | Admitting: Family Medicine

## 2016-08-22 NOTE — Telephone Encounter (Signed)
Medication has been refilled and sent to [facility] [address] 

## 2016-09-29 ENCOUNTER — Ambulatory Visit (INDEPENDENT_AMBULATORY_CARE_PROVIDER_SITE_OTHER): Payer: Medicare Other | Admitting: Family Medicine

## 2016-09-29 ENCOUNTER — Encounter: Payer: Self-pay | Admitting: Family Medicine

## 2016-09-29 DIAGNOSIS — F3342 Major depressive disorder, recurrent, in full remission: Secondary | ICD-10-CM | POA: Diagnosis not present

## 2016-09-29 DIAGNOSIS — F410 Panic disorder [episodic paroxysmal anxiety] without agoraphobia: Secondary | ICD-10-CM | POA: Diagnosis not present

## 2016-09-29 DIAGNOSIS — R51 Headache: Secondary | ICD-10-CM

## 2016-09-29 DIAGNOSIS — R519 Headache, unspecified: Secondary | ICD-10-CM

## 2016-09-29 MED ORDER — TIZANIDINE HCL 2 MG PO TABS: 2.0000 mg | ORAL_TABLET | Freq: Three times a day (TID) | ORAL | 0 refills | Status: DC | PRN

## 2016-09-29 MED ORDER — ALPRAZOLAM 0.5 MG PO TABS: 0.5000 mg | ORAL_TABLET | Freq: Two times a day (BID) | ORAL | 2 refills | Status: DC | PRN

## 2016-09-29 MED ORDER — VENLAFAXINE HCL ER 75 MG PO CP24: 75.0000 mg | ORAL_CAPSULE | Freq: Every day | ORAL | 0 refills | Status: DC

## 2016-09-29 NOTE — Progress Notes (Signed)
Name: Robin Bryan   MRN: [id number]    DOB: 1951-05-27   Date:09/29/2016       Progress Note  Subjective  Chief Complaint  Chief Complaint  Patient presents with  . Medication Refill  . Migraine    and sweating    Headache   This is a recurrent problem. The current episode started more than 1 month ago (started 4 months ago). The problem has been waxing and waning. The pain is located in the occipital (usually has sharp pain in the back of her head when she wakes up in the morning) region. The pain does not radiate. The quality of the pain is described as sharp. The pain is at a severity of 10/10 (when it comes on, the pain is a 10/10). The pain is severe. Associated symptoms include blurred vision. Pertinent negatives include no insomnia, loss of balance, nausea, neck pain, phonophobia, photophobia, sinus pressure, tinnitus or vomiting. She has tried oral narcotics (takes Oxycodone and goes back to bed) for the symptoms.    Past Medical History:  Diagnosis Date  . Anxiety   . Arthritis   . Asthma   . Barrett's esophagus   . Bronchitis   . Depression   . Diverticulosis   . Esophageal stricture   . Fibromyalgia   . GERD (gastroesophageal reflux disease)   . Hx of adenomatous colonic polyps   . Hyperlipidemia   . Hypertension   . Intramural leiomyoma of uterus   . Myocardial infarction   . Stroke [facility])     Past Surgical History:  Procedure Laterality Date  . ABDOMINAL HYSTERECTOMY    . INCONTINENCE SURGERY    . TUBAL LIGATION      Family History  Problem Relation Age of Onset  . Ovarian cancer Sister   . Cancer Sister   . Diabetes Sister   . Heart disease Sister   . Lung cancer Sister   . Colon polyps Mother   . Diabetes Mother   . Parkinson's disease Cousin   . Cancer Father     lung  . Diabetes Daughter     Social History   Social History  . Marital status: Married    Spouse name: N/A  . Number of children: N/A  . Years of education: N/A    Occupational History  . Not on file.   Social History Main Topics  . Smoking status: Former Smoker    Packs/day: 0.50    Years: 8.00    Types: Cigarettes    Quit date: 05/27/2011  . Smokeless tobacco: Never Used  . Alcohol use Yes     Comment: social , not everyday  . Drug use: No  . Sexual activity: Not on file   Other Topics Concern  . Not on file   Social History Narrative  . No narrative on file     Current Outpatient Prescriptions:  .  albuterol (PROVENTIL HFA;VENTOLIN HFA) 108 (90 BASE) MCG/ACT inhaler, Inhale 2 puffs into the lungs every 6 (six) hours as needed for wheezing or shortness of breath., Disp: 1 Inhaler, Rfl: 0 .  ALPRAZolam (XANAX) 0.5 MG tablet, Take 1 tablet (0.5 mg total) by mouth 2 (two) times daily as needed for anxiety., Disp: 60 tablet, Rfl: 2 .  clopidogrel (PLAVIX) 75 MG tablet, TAKE 1 TABLET(75 MG TOTAL) BY MOUTH DAILY., Disp: 30 tablet, Rfl: 11 .  clotrimazole-betamethasone (LOTRISONE) cream, Apply 1 application topically 2 (two) times daily., Disp: 60 g, Rfl: 2 .  EPINEPHrine 0.3 mg/0.3 mL IJ SOAJ injection, Inject 0.3 mLs (0.3 mg total) into the muscle once., Disp: 1 Device, Rfl: 0 .  esomeprazole (NEXIUM) 40 MG capsule, TAKE 1 CAPSULE(40 MG) BY MOUTH TWICE DAILY BEFORE A MEAL, Disp: 60 capsule, Rfl: 6 .  gabapentin (NEURONTIN) 800 MG tablet, TK 1 T PO QID, Disp: , Rfl: 6 .  losartan (COZAAR) 100 MG tablet, TAKE 1 TABLET BY MOUTH EVERY DAY, Disp: 90 tablet, Rfl: 0 .  rosuvastatin (CRESTOR) 10 MG tablet, TAKE 1 TABLET(10 MG) BY MOUTH DAILY, Disp: 90 tablet, Rfl: 0 .  terbinafine (LAMISIL) 250 MG tablet, Take 1 tablet (250 mg total) by mouth daily., Disp: 28 tablet, Rfl: 0 .  tolterodine (DETROL LA) 2 MG 24 hr capsule, TK 1 C PO D, Disp: , Rfl: 11 .  venlafaxine XR (EFFEXOR-XR) 75 MG 24 hr capsule, Take 1 capsule (75 mg total) by mouth daily with breakfast., Disp: 90 capsule, Rfl: 0 .  oxyCODONE-acetaminophen (PERCOCET/ROXICET) 5-325 MG tablet, 1  tablet 1 day or 1 dose. , Disp: , Rfl: 0  Allergies  Allergen Reactions  . Vicodin [Hydrocodone-Acetaminophen] Nausea Only     Review of Systems  HENT: Negative for sinus pressure and tinnitus.   Eyes: Positive for blurred vision. Negative for photophobia.  Gastrointestinal: Negative for nausea and vomiting.  Musculoskeletal: Negative for neck pain.  Neurological: Positive for headaches. Negative for loss of balance.  Psychiatric/Behavioral: The patient does not have insomnia.     Objective  Vitals:   09/29/16 0955  BP: 124/82  Pulse: 96  Resp: 16  Temp: 98.9 F (37.2 C)  TempSrc: Oral  SpO2: 96%  Weight: 233 lb 8 oz (105.9 kg)  Height: 5\' 8"  (1.727 m)    Physical Exam  Constitutional: She is oriented to person, place, and time and well-developed, well-nourished, and in no distress.  HENT:  Head: Normocephalic and atraumatic. Head is without abrasion and without contusion.  Right Ear: Tympanic membrane and ear canal normal. No drainage or swelling.  Left Ear: Tympanic membrane and ear canal normal. No drainage or swelling.  Nose: Right sinus exhibits no maxillary sinus tenderness and no frontal sinus tenderness. Left sinus exhibits no maxillary sinus tenderness and no frontal sinus tenderness.  Mouth/Throat: Oropharynx is clear and moist. No posterior oropharyngeal erythema.  Right occipito-parietal headache, radiating down the right anterior neck  Cardiovascular: Normal rate, regular rhythm, S1 normal, S2 normal and normal heart sounds.   No murmur heard. Pulmonary/Chest: Effort normal and breath sounds normal. She has no wheezes. She has no rhonchi.  Neurological: She is alert and oriented to person, place, and time.  Psychiatric: Mood, memory, affect and judgment normal.  Nursing note and vitals reviewed.    Assessment & Plan  1. Recurrent major depressive disorder, in full remission ([place]) Stable and responsive to Effexor taken every day, refills provided -  venlafaxine XR (EFFEXOR-XR) 75 MG 24 hr capsule; Take 1 capsule (75 mg total) by mouth daily with breakfast.  Dispense: 90 capsule; Refill: 0  2. Panic disorder without agoraphobia Symptoms possibly alprazolam taken daily when needed, refills provided - ALPRAZolam (XANAX) 0.5 MG tablet; Take 1 tablet (0.5 mg total) by mouth 2 (two) times daily as needed for anxiety.  Dispense: 60 tablet; Refill: 2  3. Headache upon awakening Differential for headaches upon awakening, based on patient's symptoms it is likely from muscle spasm in the neck, however we'll obtain a CT scan of brain to evaluate for other possible etiologies.  We'll start on a trial of muscle relaxant. Follow-up - CT Head Wo Contrast; Future - tiZANidine (ZANAFLEX) 2 MG tablet; Take 1 tablet (2 mg total) by mouth every 8 (eight) hours as needed for muscle spasms.  Dispense: 30 tablet; Refill: 0   Clementine Soulliere [facility] 09/29/2016 10:02 AM

## 2016-10-09 ENCOUNTER — Ambulatory Visit
Admission: RE | Admit: 2016-10-09 | Discharge: 2016-10-09 | Disposition: A | Discharge status: Home / Self Care | Payer: Medicare Other | Source: Ambulatory Visit | Attending: Family Medicine | Admitting: Family Medicine

## 2016-10-09 DIAGNOSIS — R51 Headache: Secondary | ICD-10-CM | POA: Insufficient documentation

## 2016-10-09 DIAGNOSIS — R519 Headache, unspecified: Secondary | ICD-10-CM

## 2016-10-10 ENCOUNTER — Other Ambulatory Visit: Payer: Self-pay | Admitting: Family Medicine

## 2016-10-10 DIAGNOSIS — Z1231 Encounter for screening mammogram for malignant neoplasm of breast: Secondary | ICD-10-CM

## 2016-10-14 ENCOUNTER — Ambulatory Visit: Payer: Medicare Other

## 2016-10-24 ENCOUNTER — Encounter: Payer: Self-pay | Admitting: Internal Medicine

## 2016-10-27 ENCOUNTER — Other Ambulatory Visit: Payer: Self-pay | Admitting: Family Medicine

## 2016-10-27 DIAGNOSIS — E785 Hyperlipidemia, unspecified: Secondary | ICD-10-CM

## 2016-10-29 ENCOUNTER — Telehealth: Payer: Self-pay | Admitting: Internal Medicine

## 2016-10-29 ENCOUNTER — Other Ambulatory Visit: Payer: Self-pay

## 2016-10-29 ENCOUNTER — Ambulatory Visit
Admission: RE | Admit: 2016-10-29 | Discharge: 2016-10-29 | Disposition: A | Discharge status: Home / Self Care | Payer: Medicare Other | Source: Ambulatory Visit | Attending: Family Medicine | Admitting: Family Medicine

## 2016-10-29 DIAGNOSIS — Z1231 Encounter for screening mammogram for malignant neoplasm of breast: Secondary | ICD-10-CM

## 2016-10-29 MED ORDER — PANTOPRAZOLE SODIUM 40 MG PO TBEC: 40.0000 mg | DELAYED_RELEASE_TABLET | Freq: Two times a day (BID) | ORAL | 3 refills | Status: DC

## 2016-10-29 NOTE — Telephone Encounter (Signed)
Pt aware. Script sent to pharmacy. Attempted to schedule pt with [facility] but pt wants to wait to see Dr. [name]. Pt scheduled to see Dr. [name] 12/10/16@2 :45pm. Pt mailed appt letter.

## 2016-10-29 NOTE — Telephone Encounter (Signed)
Robin Bryan pt calling, Pt states she takes plavix and her PCP told her she should not take Nexium with the plavix. She currently takes nexium 40mg  bid. Pt also states she has IBS and has been having loose stools. Reports she has been taking Imodium but does not want to take this med. She states the loose stools do not happen everyday and they are unpredictable. Dr. Fuller Plan as doc of the day please advise.

## 2016-10-29 NOTE — Telephone Encounter (Signed)
Pantoprazole 40 mg po bid in place of Nexium Recommend office appt with JP or an APP for diarrhea evaluation

## 2016-11-07 ENCOUNTER — Ambulatory Visit: Payer: Medicare Other | Admitting: Physician Assistant

## 2016-12-10 ENCOUNTER — Encounter: Payer: Self-pay | Admitting: Internal Medicine

## 2016-12-10 ENCOUNTER — Ambulatory Visit (INDEPENDENT_AMBULATORY_CARE_PROVIDER_SITE_OTHER): Payer: Medicare Other | Admitting: Internal Medicine

## 2016-12-10 VITALS — BP 114/72 | HR 68 | Ht 68.0 in | Wt 235.0 lb

## 2016-12-10 DIAGNOSIS — R1310 Dysphagia, unspecified: Secondary | ICD-10-CM | POA: Diagnosis not present

## 2016-12-10 DIAGNOSIS — K219 Gastro-esophageal reflux disease without esophagitis: Secondary | ICD-10-CM

## 2016-12-10 DIAGNOSIS — R197 Diarrhea, unspecified: Secondary | ICD-10-CM | POA: Diagnosis not present

## 2016-12-10 DIAGNOSIS — K2270 Barrett's esophagus without dysplasia: Secondary | ICD-10-CM

## 2016-12-10 DIAGNOSIS — R159 Full incontinence of feces: Secondary | ICD-10-CM

## 2016-12-10 DIAGNOSIS — Z86010 Personal history of colonic polyps: Secondary | ICD-10-CM

## 2016-12-10 MED ORDER — NA SULFATE-K SULFATE-MG SULF 17.5-3.13-1.6 GM/177ML PO SOLN: 1.0000 | Freq: Once | ORAL | 0 refills | Status: AC

## 2016-12-10 NOTE — Progress Notes (Signed)
HISTORY OF PRESENT ILLNESS:  Robin Bryan is a 66 y.o. female who has been followed in this office for chronic GERD associated with Barrett's esophagus, peptic stricture requiring esophageal dilation, adenomatous colon polyps, and intermittent fecal incontinence associated with loose stools. She has a very remote history of CVA/TIA for which she has been on chronic Plavix therapy for greater than 10 years. She presents today with chief complaints of worsening reflux, recurrent dysphagia, and the need for surveillance colonoscopy. Also irritation of the perirectal skin with loose stools and incontinence. She was last evaluated in this office 02/01/2015. See that dictation for details. She subsequently underwent upper endoscopy. Short segment Barrett's esophagus without dysplasia. Large-caliber esophageal stricture dilated to 89 [place]. She was for that examination. She now reports that despite twice daily pantoprazole she does experience regurgitation and burning. She takes over-the-counter antacids. She has not had success losing weight. She describes dysphagia to pills but not food. She also describes a vague choking sensation when she brushes her teeth or lies on her back at night. She feels that she needs repeat esophageal dilation. Her last colonoscopy was 5 years ago. Adenomatous polyps at that time. Also moderate sigmoid diverticulosis. She is due for surveillance colonoscopy. She continues with incontinent episodes which she finds very problematic. She has not tried fiber supplementation. No cerebrovascular or cardiac problems since her last visit except for pericardial effusion being monitored by cardiology.  REVIEW OF SYSTEMS:  All non-GI ROS negative except for skin rash, back pain, anxiety, muscle aches  Past Medical History:  Diagnosis Date  . Anxiety   . Arthritis   . Asthma   . Barrett's esophagus   . Bronchitis   . Depression   . Diverticulosis   . Esophageal stricture   .  Fibromyalgia   . GERD (gastroesophageal reflux disease)   . Hx of adenomatous colonic polyps   . Hyperlipidemia   . Hypertension   . Intramural leiomyoma of uterus   . Myocardial infarction   . Stroke [facility])     Past Surgical History:  Procedure Laterality Date  . ABDOMINAL HYSTERECTOMY    . INCONTINENCE SURGERY    . TUBAL LIGATION      Social History Robin Bryan  reports that she quit smoking about 5 years ago. Her smoking use included Cigarettes. She has a 4.00 pack-year smoking history. She has never used smokeless tobacco. She reports that she drinks alcohol. She reports that she does not use drugs.  family history includes Cancer in her father and sister; Colon polyps in her mother; Diabetes in her daughter, mother, and sister; Heart disease in her sister; Lung cancer in her sister; Ovarian cancer in her sister; Parkinson's disease in her cousin.  Allergies  Allergen Reactions  . Vicodin [Hydrocodone-Acetaminophen] Nausea Only       PHYSICAL EXAMINATION: Vital signs: BP 114/72   Pulse 68   Ht 5\' 8"  (1.727 m)   Wt 235 lb (106.6 kg)   BMI 35.73 kg/m   Constitutional: generally well-appearing, no acute distress Psychiatric: alert and oriented x3, cooperative Eyes: extraocular movements intact, anicteric, conjunctiva pink Mouth: oral pharynx moist, no lesions Neck: supple no lymphadenopathy Cardiovascular: heart regular rate and rhythm, no murmur Lungs: clear to auscultation bilaterally Abdomen: soft,Obese, nontender, nondistended, no obvious ascites, no peritoneal signs, normal bowel sounds, no organomegaly Rectal:Deferred until colonoscopy Extremities: no clubbing cyanosis or lower extremity edema bilaterally Skin: no lesions on visible extremities Neuro: No focal deficits. Cranial nerves intact  ASSESSMENT:  #  1. GERD, complicated by peptic stricture and short segment Barrett's esophagus. Now with mild recurrent esophageal dysphagia to pills and vague choking  sensation. Significant deterioration of reflux disease related to obesity and improper timing of PPI medication #2. Intermittent loose stools with incontinence #3. Adenomatous colon polyps. Due for surveillance #4. Multiple medical problems. #5. On Plavix for remote history of CVA/TIA. Previously discontinued with aspirin overlap for her last invasive procedure  PLAN:  #1. Reflux precautions with attention to weight loss #2. Proper timing of PPI administration reviewed #3. Antacids as needed for breakthrough symptoms #4. Schedule upper endoscopy with probable esophageal dilation. The patient is high-risk.The nature of the procedure, as well as the risks, benefits, and alternatives were carefully and thoroughly reviewed with the patient. Ample time for discussion and questions allowed. The patient understood, was satisfied, and agreed to proceed. #5. Schedule surveillance colonoscopy. Also biopsies to rule out microscopic colitis. The patient is high-risk.The nature of the procedure, as well as the risks, benefits, and alternatives were carefully and thoroughly reviewed with the patient. Ample time for discussion and questions allowed. The patient understood, was satisfied, and agreed to proceed. #6. Recommend Metamucil 2 tablespoons daily to see if this helps with incontinence and loose stools #7. Recommend Desitin for sore scan around the perianal area  #8. Ongoing general medical care with PCP and other specialists #9. Hold Plavix 1 week prior to the procedures #10. Initiate aspirin 81 mg daily while off Plavix

## 2016-12-10 NOTE — Patient Instructions (Signed)

## 2016-12-18 ENCOUNTER — Other Ambulatory Visit: Payer: Self-pay | Admitting: Family Medicine

## 2016-12-19 NOTE — Telephone Encounter (Signed)
Per last lab results, patient was due for recheck labs 6 months after last, due late February; I'll approve a week and invite her to come in for labs at Dr. Trena Platt request He can order Monday if not in the system

## 2016-12-23 ENCOUNTER — Ambulatory Visit (INDEPENDENT_AMBULATORY_CARE_PROVIDER_SITE_OTHER): Payer: Medicare Other | Admitting: Family Medicine

## 2016-12-23 ENCOUNTER — Encounter: Payer: Self-pay | Admitting: Family Medicine

## 2016-12-23 DIAGNOSIS — M546 Pain in thoracic spine: Secondary | ICD-10-CM

## 2016-12-23 DIAGNOSIS — I10 Essential (primary) hypertension: Secondary | ICD-10-CM | POA: Diagnosis not present

## 2016-12-23 DIAGNOSIS — R21 Rash and other nonspecific skin eruption: Secondary | ICD-10-CM | POA: Diagnosis not present

## 2016-12-23 MED ORDER — LOSARTAN POTASSIUM-HCTZ 100-12.5 MG PO TABS: 1.0000 | ORAL_TABLET | Freq: Every day | ORAL | 0 refills | Status: DC

## 2016-12-23 MED ORDER — DIPHENHYDRAMINE HCL 25 MG PO CAPS: 25.0000 mg | ORAL_CAPSULE | Freq: Three times a day (TID) | ORAL | 0 refills | Status: DC | PRN

## 2016-12-23 MED ORDER — PREDNISONE 10 MG (21) PO TBPK: ORAL_TABLET | ORAL | 0 refills | Status: DC

## 2016-12-23 NOTE — Progress Notes (Signed)
Name: Robin Bryan   MRN: [id number]    DOB: 01-11-1951   Date:12/23/2016       Progress Note  Subjective  Chief Complaint  Chief Complaint  Patient presents with  . Medication Reaction    swelling and redness in face    Hypertension  This is a chronic problem. The problem is unchanged. The problem is uncontrolled. Associated symptoms include blurred vision and palpitations. Pertinent negatives include no chest pain, headaches or shortness of breath. Past treatments include angiotensin blockers. There is no history of kidney disease, CAD/MI or CVA (hx of TIA).    Patient presents for complaints of swelling and redness in her face, ongoing for 2 weeks or more, has felt she had a fever, has been nauseated. Symptoms onset was abrupt, she believed it to be a medication reaction and went off Tizanidine, Primidone, and Vesicare, but the swelling is still there.  She has history of anaphylaxis but no symptoms suggestive of anaphylaxis with current episode of swelling and redness.  Past Medical History:  Diagnosis Date  . Anxiety   . Arthritis   . Asthma   . Barrett's esophagus   . Bronchitis   . Depression   . Diverticulosis   . Esophageal stricture   . Fibromyalgia   . GERD (gastroesophageal reflux disease)   . Hx of adenomatous colonic polyps   . Hyperlipidemia   . Hypertension   . Intramural leiomyoma of uterus   . Myocardial infarction ([place])   . Stroke [facility])     Past Surgical History:  Procedure Laterality Date  . ABDOMINAL HYSTERECTOMY    . INCONTINENCE SURGERY    . TUBAL LIGATION      Family History  Problem Relation Age of Onset  . Ovarian cancer Sister   . Cancer Sister   . Diabetes Sister   . Heart disease Sister   . Lung cancer Sister   . Colon polyps Mother   . Diabetes Mother   . Parkinson's disease Cousin   . Cancer Father     lung  . Diabetes Daughter     Social History   Social History  . Marital status: Married    Spouse name: N/A  . Number  of children: N/A  . Years of education: N/A   Occupational History  . Not on file.   Social History Main Topics  . Smoking status: Former Smoker    Packs/day: 0.50    Years: 8.00    Types: Cigarettes    Quit date: 05/27/2011  . Smokeless tobacco: Never Used  . Alcohol use Yes     Comment: social , not everyday  . Drug use: No  . Sexual activity: Not on file   Other Topics Concern  . Not on file   Social History Narrative  . No narrative on file     Current Outpatient Prescriptions:  .  ALPRAZolam (XANAX) 0.5 MG tablet, Take 1 tablet (0.5 mg total) by mouth 2 (two) times daily as needed for anxiety., Disp: 60 tablet, Rfl: 2 .  clopidogrel (PLAVIX) 75 MG tablet, TAKE 1 TABLET(75 MG TOTAL) BY MOUTH DAILY., Disp: 90 tablet, Rfl: 3 .  clotrimazole-betamethasone (LOTRISONE) cream, Apply 1 application topically 2 (two) times daily., Disp: 60 g, Rfl: 2 .  EPINEPHrine 0.3 mg/0.3 mL IJ SOAJ injection, Inject 0.3 mLs (0.3 mg total) into the muscle once., Disp: 1 Device, Rfl: 0 .  gabapentin (NEURONTIN) 800 MG tablet, TK 1 T PO QID, Disp: , Rfl:  6 .  losartan (COZAAR) 100 MG tablet, TAKE 1 TABLET BY MOUTH EVERY DAY, Disp: 7 tablet, Rfl: 0 .  pantoprazole (PROTONIX) 40 MG tablet, Take 1 tablet (40 mg total) by mouth 2 (two) times daily., Disp: 60 tablet, Rfl: 3 .  rosuvastatin (CRESTOR) 10 MG tablet, TAKE 1 TABLET(10 MG) BY MOUTH DAILY, Disp: 90 tablet, Rfl: 0 .  venlafaxine XR (EFFEXOR-XR) 75 MG 24 hr capsule, Take 1 capsule (75 mg total) by mouth daily with breakfast., Disp: 90 capsule, Rfl: 0 .  primidone (MYSOLINE) 50 MG tablet, Take 1 tablet by mouth daily., Disp: , Rfl: 6 .  solifenacin (VESICARE) 10 MG tablet, Take 10 mg by mouth daily., Disp: , Rfl:  .  tiZANidine (ZANAFLEX) 2 MG tablet, Take 1 tablet (2 mg total) by mouth every 8 (eight) hours as needed for muscle spasms. (Patient not taking: Reported on 12/23/2016), Disp: 30 tablet, Rfl: 0  Allergies  Allergen Reactions  .  Vicodin [Hydrocodone-Acetaminophen] Nausea Only     Review of Systems  Eyes: Positive for blurred vision.  Respiratory: Negative for shortness of breath.   Cardiovascular: Positive for palpitations. Negative for chest pain.  Neurological: Negative for headaches.      Objective  Vitals:   12/23/16 1408  BP: (!) 141/73  Pulse: 95  Resp: 17  Temp: 97.7 F (36.5 C)  TempSrc: Oral  SpO2: 96%  Weight: 236 lb 9.6 oz (107.3 kg)  Height: 5\' 8"  (1.727 m)    Physical Exam  Constitutional: She is oriented to person, place, and time and well-developed, well-nourished, and in no distress.  HENT:  Head: Normocephalic and atraumatic.    Right Ear: Tympanic membrane and ear canal normal. No drainage or swelling.  Left Ear: Tympanic membrane and ear canal normal. No drainage or swelling.  Mouth/Throat: Oropharynx is clear and moist.  Cardiovascular: Normal rate, regular rhythm, S1 normal and S2 normal.   Murmur heard.  Systolic murmur is present with a grade of 2/6  Pulmonary/Chest: Effort normal and breath sounds normal. No respiratory distress. She has no wheezes. She has no rhonchi.  Musculoskeletal:       Right ankle: She exhibits no swelling.       Left ankle: She exhibits no swelling.  Neurological: She is alert and oriented to person, place, and time.  Skin: Rash noted. Rash is macular.  Fine macular erythematous, nonpruritic rash on the left side of face  Psychiatric: Mood, memory, affect and judgment normal.  Nursing note and vitals reviewed.    Assessment & Plan  1. Benign essential HTN Uncontrolled on losartan alone, add hydrochlorothiazide and reassess in one month - losartan-hydrochlorothiazide (HYZAAR) 100-12.5 MG tablet; Take 1 tablet by mouth daily.  Dispense: 90 tablet; Refill: 0  2. Facial rash Unclear etiology, start on Benadryl and a tapering course of steroids for possible allergic reaction, - diphenhydrAMINE (BENADRYL) 25 mg capsule; Take 1 capsule (25  mg total) by mouth every 8 (eight) hours as needed.  Dispense: 30 capsule; Refill: 0 - predniSONE (STERAPRED UNI-PAK 21 TAB) 10 MG (21) TBPK tablet; 60 50 40 30 20 10  then STOP  Dispense: 21 tablet; Refill: 0  3. Acute thoracic back pain, unspecified back pain laterality EKG is normal sinus rhythm, reassured that her thoracic back pain is unlikely to be from cardiac ischemia, advised to seek immediate medical attention if she has anything concerning symptoms. - EKG 12-Lead  Yareth Macdonnell [facility] 12/23/2016 2:22  PM

## 2016-12-26 ENCOUNTER — Telehealth: Payer: Self-pay | Admitting: Family Medicine

## 2016-12-26 DIAGNOSIS — R21 Rash and other nonspecific skin eruption: Secondary | ICD-10-CM

## 2016-12-26 MED ORDER — PREDNISONE 10 MG PO TABS: 10.0000 mg | ORAL_TABLET | Freq: Every day | ORAL | 0 refills | Status: DC

## 2016-12-26 NOTE — Telephone Encounter (Signed)
Pt would like a call back about her Prednisone.

## 2016-12-26 NOTE — Telephone Encounter (Signed)
I returned this patient's call from earlier. Patient stated that she thinks she has thrown away the last 4 pills of the prednisone and wants to know if she can get a refill to continue her regimen.  Patient stated that she was given the wrong rx for Bendryl (a liquid instead of the pill) and when she asked them about it they took it back stating they do not know who filled this rx and told her to get it from [facility]. She wanted to see if she can get the capsule form. I contacted them to find out what happened and was told by the pharmacist that she was given a liquid by accident then the took it back and showed her where the capsules where so she could purchase them.  She uses [facility] in [place]

## 2016-12-26 NOTE — Telephone Encounter (Signed)
Patient misplaced the day # 3 prescription for a six-day course of prednisone, and she was supposed to take 4 tablets of 10 mg each daily and cannot find them, we will send four tablets of 10 mg prednisone to her pharmacy to be taken today. As far as Benadryl is concerned, I informed her that she should go to a different pharmacy and obtain over-the-counter Benadryl which should be appropriate. Patient verbalized agreement

## 2016-12-27 ENCOUNTER — Other Ambulatory Visit: Payer: Self-pay | Admitting: Family Medicine

## 2016-12-27 DIAGNOSIS — F3342 Major depressive disorder, recurrent, in full remission: Secondary | ICD-10-CM

## 2017-01-01 ENCOUNTER — Ambulatory Visit (INDEPENDENT_AMBULATORY_CARE_PROVIDER_SITE_OTHER): Payer: Medicare Other | Admitting: Family Medicine

## 2017-01-01 ENCOUNTER — Telehealth: Payer: Self-pay | Admitting: Family Medicine

## 2017-01-01 ENCOUNTER — Ambulatory Visit
Admission: RE | Admit: 2017-01-01 | Discharge: 2017-01-01 | Disposition: A | Discharge status: Home / Self Care | Payer: Medicare Other | Source: Ambulatory Visit | Attending: Family Medicine | Admitting: Family Medicine

## 2017-01-01 ENCOUNTER — Encounter: Payer: Self-pay | Admitting: Family Medicine

## 2017-01-01 ENCOUNTER — Other Ambulatory Visit: Payer: Self-pay

## 2017-01-01 VITALS — BP 140/71 | HR 83 | Temp 98.2°F | Resp 16 | Ht 68.0 in | Wt 233.1 lb

## 2017-01-01 DIAGNOSIS — M25511 Pain in right shoulder: Secondary | ICD-10-CM

## 2017-01-01 DIAGNOSIS — M79604 Pain in right leg: Secondary | ICD-10-CM

## 2017-01-01 DIAGNOSIS — G8929 Other chronic pain: Secondary | ICD-10-CM | POA: Diagnosis present

## 2017-01-01 NOTE — Progress Notes (Signed)
Name: Robin Bryan   MRN: [id number]    DOB: Feb 15, 1951   Date:01/01/2017       Progress Note  Subjective  Chief Complaint  Chief Complaint  Patient presents with  . Shoulder Pain    pt is requesting x-ray for right shoulder  . Leg Pain    pt is requesting x-ray for right leg    Shoulder Pain   The pain is present in the right shoulder and right arm. This is a recurrent (has history of chronic right shoulder pain, has received shots in the shoulder, ) problem. The quality of the pain is described as aching. The pain is at a severity of 7/10. The pain is moderate. Pertinent negatives include no joint swelling, numbness or stiffness.  Leg Pain   There was no injury mechanism (has remote history of injury to the right leg when she fell down the steps and injured her right anterior leg., had 3 hematomas). The pain is present in the right leg. The pain is moderate. Pertinent negatives include no numbness. She reports no foreign bodies present. The symptoms are aggravated by movement. Treatments tried: has taken Percocet that she had prescribed by her Neurologist.     Past Medical History:  Diagnosis Date  . Anxiety   . Arthritis   . Asthma   . Barrett's esophagus   . Bronchitis   . Depression   . Diverticulosis   . Esophageal stricture   . Fibromyalgia   . GERD (gastroesophageal reflux disease)   . Hx of adenomatous colonic polyps   . Hyperlipidemia   . Hypertension   . Intramural leiomyoma of uterus   . Myocardial infarction ([place])   . Stroke [facility])     Past Surgical History:  Procedure Laterality Date  . ABDOMINAL HYSTERECTOMY    . INCONTINENCE SURGERY    . TUBAL LIGATION      Family History  Problem Relation Age of Onset  . Ovarian cancer Sister   . Cancer Sister   . Diabetes Sister   . Heart disease Sister   . Lung cancer Sister   . Colon polyps Mother   . Diabetes Mother   . Parkinson's disease Cousin   . Cancer Father     lung  . Diabetes Daughter      Social History   Social History  . Marital status: Married    Spouse name: N/A  . Number of children: N/A  . Years of education: N/A   Occupational History  . Not on file.   Social History Main Topics  . Smoking status: Former Smoker    Packs/day: 0.50    Years: 8.00    Types: Cigarettes    Quit date: 05/27/2011  . Smokeless tobacco: Never Used  . Alcohol use Yes     Comment: social , not everyday  . Drug use: No  . Sexual activity: Not on file   Other Topics Concern  . Not on file   Social History Narrative  . No narrative on file     Current Outpatient Prescriptions:  .  ALPRAZolam (XANAX) 0.5 MG tablet, Take 1 tablet (0.5 mg total) by mouth 2 (two) times daily as needed for anxiety., Disp: 60 tablet, Rfl: 2 .  clopidogrel (PLAVIX) 75 MG tablet, TAKE 1 TABLET(75 MG TOTAL) BY MOUTH DAILY., Disp: 90 tablet, Rfl: 3 .  clotrimazole-betamethasone (LOTRISONE) cream, Apply 1 application topically 2 (two) times daily., Disp: 60 g, Rfl: 2 .  diphenhydrAMINE (BENADRYL) 25  mg capsule, Take 1 capsule (25 mg total) by mouth every 8 (eight) hours as needed., Disp: 30 capsule, Rfl: 0 .  EPINEPHrine 0.3 mg/0.3 mL IJ SOAJ injection, Inject 0.3 mLs (0.3 mg total) into the muscle once., Disp: 1 Device, Rfl: 0 .  gabapentin (NEURONTIN) 800 MG tablet, TK 1 T PO QID, Disp: , Rfl: 6 .  losartan (COZAAR) 100 MG tablet, TAKE 1 TABLET BY MOUTH EVERY DAY, Disp: 7 tablet, Rfl: 0 .  losartan-hydrochlorothiazide (HYZAAR) 100-12.5 MG tablet, Take 1 tablet by mouth daily., Disp: 90 tablet, Rfl: 0 .  pantoprazole (PROTONIX) 40 MG tablet, Take 1 tablet (40 mg total) by mouth 2 (two) times daily., Disp: 60 tablet, Rfl: 3 .  primidone (MYSOLINE) 50 MG tablet, Take 1 tablet by mouth daily., Disp: , Rfl: 6 .  rosuvastatin (CRESTOR) 10 MG tablet, TAKE 1 TABLET(10 MG) BY MOUTH DAILY, Disp: 90 tablet, Rfl: 0 .  solifenacin (VESICARE) 10 MG tablet, Take 10 mg by mouth daily., Disp: , Rfl:  .  tiZANidine  (ZANAFLEX) 2 MG tablet, Take 1 tablet (2 mg total) by mouth every 8 (eight) hours as needed for muscle spasms., Disp: 30 tablet, Rfl: 0 .  venlafaxine XR (EFFEXOR-XR) 75 MG 24 hr capsule, TAKE 1 CAPSULE(75 MG) BY MOUTH DAILY WITH BREAKFAST, Disp: 90 capsule, Rfl: 0 .  nitrofurantoin (MACRODANTIN) 100 MG capsule, TK 1 C PO BID FOR 5 DAYS THEN 1 C PO QHS FOR 90 DAYS, Disp: , Rfl: 0 .  predniSONE (DELTASONE) 10 MG tablet, Take 1 tablet (10 mg total) by mouth daily with breakfast. (Patient not taking: Reported on 01/01/2017), Disp: 4 tablet, Rfl: 0 .  predniSONE (STERAPRED UNI-PAK 21 TAB) 10 MG (21) TBPK tablet, 60 50 40 30 20 10  then STOP (Patient not taking: Reported on 01/01/2017), Disp: 21 tablet, Rfl: 0  Allergies  Allergen Reactions  . Vicodin [Hydrocodone-Acetaminophen] Nausea Only     Review of Systems  Musculoskeletal: Negative for stiffness.  Neurological: Negative for numbness.    Please see history of present illness for complete discussion of ROS  Objective  Vitals:   01/01/17 1147  BP: 140/71  Pulse: 83  Resp: 16  Temp: 98.2 F (36.8 C)  TempSrc: Oral  SpO2: 98%  Weight: 233 lb 1.6 oz (105.7 kg)  Height: 5\' 8"  (1.727 m)    Physical Exam  Constitutional: She is well-developed, well-nourished, and in no distress.  HENT:  Head: Normocephalic and atraumatic.  Musculoskeletal:       Right shoulder: She exhibits tenderness and pain. She exhibits no swelling.       Arms:      Right lower leg: She exhibits tenderness. She exhibits no swelling, no edema and no deformity.       Legs: Tenderness over the right postero-lateral shoulder, pain with raising the arm past midline. Tenderness to palpation over the anterior aspect of right lower leg, no swelling  Psychiatric: Mood, memory, affect and judgment normal.  Nursing note and vitals reviewed.     Assessment & Plan  1. Right leg pain Unclear etiology, rule out blood clot with ultrasound, obtain x-ray of tibia and  fibula - DG Tibia/Fibula Right; Future - US Venous Img Lower Unilateral Right; Future  2. Chronic right shoulder pain Gradually getting worse, possibly arthritis but also need to rule out rotator cuff pathology. Obtain x-rays and follow-up - DG Shoulder Right; Future   Tristin Vandeusen [facility] 01/01/2017 11:57  AM

## 2017-01-01 NOTE — Telephone Encounter (Signed)
errenous °

## 2017-02-03 ENCOUNTER — Encounter: Payer: Medicare Other | Admitting: Internal Medicine

## 2017-02-03 ENCOUNTER — Other Ambulatory Visit: Payer: Self-pay | Admitting: Family Medicine

## 2017-02-03 DIAGNOSIS — E785 Hyperlipidemia, unspecified: Secondary | ICD-10-CM

## 2017-02-10 ENCOUNTER — Ambulatory Visit: Payer: Self-pay | Admitting: Podiatry

## 2017-02-12 ENCOUNTER — Telehealth: Payer: Self-pay | Admitting: Family Medicine

## 2017-02-12 ENCOUNTER — Other Ambulatory Visit: Payer: Self-pay | Admitting: Family Medicine

## 2017-02-12 DIAGNOSIS — E785 Hyperlipidemia, unspecified: Secondary | ICD-10-CM

## 2017-02-12 DIAGNOSIS — E7800 Pure hypercholesterolemia, unspecified: Secondary | ICD-10-CM

## 2017-02-12 NOTE — Telephone Encounter (Signed)
Order for fasting lipid panel is placed

## 2017-02-12 NOTE — Telephone Encounter (Signed)
Patient needs an office visit appointment to obtain fasting lipid panel.

## 2017-02-12 NOTE — Telephone Encounter (Signed)
COULD YOU PLEASE GO AHEAD AND PLACE THE ORDER FOR LABS SO THAT SHE CAN COME IN TOMORROW TO GET THOSE AND SHE IS COMING IN Monday FOR APPT TO SEE YOU.

## 2017-02-12 NOTE — Telephone Encounter (Signed)
PT NEEDS ROSUVASTATIN REFILLED AT [facility] WITH NO RESPONSE. SHE ONLY HAS 2 LEFT.

## 2017-02-13 LAB — LIPID PANEL
CHOL/HDL RATIO: 4.1 ratio (ref <5.0)
Cholesterol: 130 mg/dL (ref <200)
HDL: 32 mg/dL — AB (ref >50)
LDL Cholesterol: 63 mg/dL (ref <100)
Triglycerides: 173 mg/dL — ABNORMAL HIGH (ref <150)
VLDL: 35 mg/dL — AB (ref <30)

## 2017-02-16 ENCOUNTER — Encounter: Payer: Self-pay | Admitting: Family Medicine

## 2017-02-16 ENCOUNTER — Ambulatory Visit (INDEPENDENT_AMBULATORY_CARE_PROVIDER_SITE_OTHER): Payer: Medicare Other | Admitting: Family Medicine

## 2017-02-16 DIAGNOSIS — F410 Panic disorder [episodic paroxysmal anxiety] without agoraphobia: Secondary | ICD-10-CM | POA: Diagnosis not present

## 2017-02-16 DIAGNOSIS — E782 Mixed hyperlipidemia: Secondary | ICD-10-CM | POA: Diagnosis not present

## 2017-02-16 MED ORDER — ROSUVASTATIN CALCIUM 10 MG PO TABS: ORAL_TABLET | ORAL | 0 refills | Status: DC

## 2017-02-16 MED ORDER — ALPRAZOLAM 0.5 MG PO TABS: 0.5000 mg | ORAL_TABLET | Freq: Two times a day (BID) | ORAL | 2 refills | Status: DC | PRN

## 2017-02-16 NOTE — Progress Notes (Signed)
Name: Robin Bryan   MRN: [id number]    DOB: 06-Nov-1950   Date:02/16/2017       Progress Note  Subjective  Chief Complaint  Chief Complaint  Patient presents with  . Medication Refill    Hyperlipidemia  This is a chronic problem. The problem is controlled. Recent lipid tests were reviewed and are normal. Exacerbating diseases include obesity. Pertinent negatives include no leg pain, myalgias or shortness of breath. Current antihyperlipidemic treatment includes statins. Risk factors for coronary artery disease include dyslipidemia.  Anxiety  Presents for follow-up visit. Symptoms include depressed mood and irritability. Patient reports no excessive worry, nervous/anxious behavior or shortness of breath. The severity of symptoms is moderate and causing significant distress.      Past Medical History:  Diagnosis Date  . Anxiety   . Arthritis   . Asthma   . Barrett's esophagus   . Bronchitis   . Depression   . Diverticulosis   . Esophageal stricture   . Fibromyalgia   . GERD (gastroesophageal reflux disease)   . Hx of adenomatous colonic polyps   . Hyperlipidemia   . Hypertension   . Intramural leiomyoma of uterus   . Myocardial infarction ([place])   . Stroke [facility])     Past Surgical History:  Procedure Laterality Date  . ABDOMINAL HYSTERECTOMY    . INCONTINENCE SURGERY    . TUBAL LIGATION      Family History  Problem Relation Age of Onset  . Ovarian cancer Sister   . Cancer Sister   . Diabetes Sister   . Heart disease Sister   . Lung cancer Sister   . Colon polyps Mother   . Diabetes Mother   . Parkinson's disease Cousin   . Cancer Father        lung  . Diabetes Daughter     Social History   Social History  . Marital status: Married    Spouse name: N/A  . Number of children: N/A  . Years of education: N/A   Occupational History  . Not on file.   Social History Main Topics  . Smoking status: Former Smoker    Packs/day: 0.50    Years: 8.00    Types:  Cigarettes    Quit date: 05/27/2011  . Smokeless tobacco: Never Used  . Alcohol use Yes     Comment: social , not everyday  . Drug use: No  . Sexual activity: Not on file   Other Topics Concern  . Not on file   Social History Narrative  . No narrative on file     Current Outpatient Prescriptions:  .  ALPRAZolam (XANAX) 0.5 MG tablet, Take 1 tablet (0.5 mg total) by mouth 2 (two) times daily as needed for anxiety., Disp: 60 tablet, Rfl: 2 .  clopidogrel (PLAVIX) 75 MG tablet, TAKE 1 TABLET(75 MG TOTAL) BY MOUTH DAILY., Disp: 90 tablet, Rfl: 3 .  clotrimazole-betamethasone (LOTRISONE) cream, Apply 1 application topically 2 (two) times daily., Disp: 60 g, Rfl: 2 .  diphenhydrAMINE (BENADRYL) 25 mg capsule, Take 1 capsule (25 mg total) by mouth every 8 (eight) hours as needed., Disp: 30 capsule, Rfl: 0 .  EPINEPHrine 0.3 mg/0.3 mL IJ SOAJ injection, Inject 0.3 mLs (0.3 mg total) into the muscle once., Disp: 1 Device, Rfl: 0 .  gabapentin (NEURONTIN) 800 MG tablet, TK 1 T PO QID, Disp: , Rfl: 6 .  losartan (COZAAR) 100 MG tablet, TAKE 1 TABLET BY MOUTH EVERY DAY, Disp: 7 tablet,  Rfl: 0 .  losartan-hydrochlorothiazide (HYZAAR) 100-12.5 MG tablet, Take 1 tablet by mouth daily., Disp: 90 tablet, Rfl: 0 .  nitrofurantoin (MACRODANTIN) 100 MG capsule, TK 1 C PO BID FOR 5 DAYS THEN 1 C PO QHS FOR 90 DAYS, Disp: , Rfl: 0 .  oxyCODONE-acetaminophen (PERCOCET/ROXICET) 5-325 MG tablet, Take 1 tablet by mouth daily as needed for severe pain., Disp: , Rfl:  .  pantoprazole (PROTONIX) 40 MG tablet, Take 1 tablet (40 mg total) by mouth 2 (two) times daily., Disp: 60 tablet, Rfl: 3 .  primidone (MYSOLINE) 50 MG tablet, Take 1 tablet by mouth daily., Disp: , Rfl: 6 .  rosuvastatin (CRESTOR) 10 MG tablet, TAKE 1 TABLET(10 MG) BY MOUTH DAILY, Disp: 90 tablet, Rfl: 0 .  solifenacin (VESICARE) 10 MG tablet, Take 10 mg by mouth daily., Disp: , Rfl:  .  tiZANidine (ZANAFLEX) 2 MG tablet, Take 1 tablet (2 mg  total) by mouth every 8 (eight) hours as needed for muscle spasms., Disp: 30 tablet, Rfl: 0 .  venlafaxine XR (EFFEXOR-XR) 75 MG 24 hr capsule, TAKE 1 CAPSULE(75 MG) BY MOUTH DAILY WITH BREAKFAST, Disp: 90 capsule, Rfl: 0  Allergies  Allergen Reactions  . Vicodin [Hydrocodone-Acetaminophen] Nausea Only     Review of Systems  Constitutional: Positive for irritability.  Respiratory: Negative for shortness of breath.   Musculoskeletal: Negative for myalgias.  Psychiatric/Behavioral: The patient is not nervous/anxious.      Objective  Vitals:   02/16/17 1418  BP: 137/74  Pulse: 83  Resp: 16  Temp: 97.9 F (36.6 C)  TempSrc: Oral  SpO2: 96%  Weight: 226 lb (102.5 kg)  Height: 5\' 8"  (1.727 m)    Physical Exam  Constitutional: She is oriented to person, place, and time and well-developed, well-nourished, and in no distress.  HENT:  Head: Normocephalic and atraumatic.  Cardiovascular: Normal rate, regular rhythm and normal heart sounds.   No murmur heard. Pulmonary/Chest: Effort normal and breath sounds normal. She has no wheezes.  Abdominal: Soft. Bowel sounds are normal. There is no tenderness.  Neurological: She is alert and oriented to person, place, and time.  Psychiatric: Mood, memory, affect and judgment normal.  Nursing note and vitals reviewed.      Assessment & Plan  1. Mixed hyperlipidemia Stable, obtain FLP - rosuvastatin (CRESTOR) 10 MG tablet; TAKE 1 TABLET(10 MG) BY MOUTH DAILY  Dispense: 90 tablet; Refill: 0  2. Panic disorder without agoraphobia Continue alprazolam as prescribed - ALPRAZolam (XANAX) 0.5 MG tablet; Take 1 tablet (0.5 mg total) by mouth 2 (two) times daily as needed for anxiety.  Dispense: 60 tablet; Refill: 2   Kaili Castille [facility] 02/16/2017 3:14 PM

## 2017-02-18 ENCOUNTER — Other Ambulatory Visit: Payer: Self-pay | Admitting: Specialist

## 2017-02-18 DIAGNOSIS — R270 Ataxia, unspecified: Secondary | ICD-10-CM

## 2017-02-18 DIAGNOSIS — T887XXA Unspecified adverse effect of drug or medicament, initial encounter: Secondary | ICD-10-CM

## 2017-02-18 DIAGNOSIS — R531 Weakness: Secondary | ICD-10-CM

## 2017-02-18 DIAGNOSIS — H538 Other visual disturbances: Secondary | ICD-10-CM

## 2017-02-24 NOTE — Telephone Encounter (Signed)
errenous °

## 2017-02-26 ENCOUNTER — Ambulatory Visit (AMBULATORY_SURGERY_CENTER): Payer: Self-pay

## 2017-02-26 VITALS — Ht 66.5 in | Wt 229.0 lb

## 2017-02-26 DIAGNOSIS — Z860100 Personal history of colon polyps, unspecified: Secondary | ICD-10-CM

## 2017-02-26 MED ORDER — SUPREP BOWEL PREP KIT 17.5-3.13-1.6 GM/177ML PO SOLN: 1.0000 | Freq: Once | ORAL | 0 refills | Status: AC

## 2017-02-26 NOTE — Progress Notes (Signed)
No allergies to eggs or soy No home oxygen No diet meds No past problems with anesthesia  Registered emmi 

## 2017-03-09 ENCOUNTER — Encounter: Payer: Self-pay | Admitting: Internal Medicine

## 2017-03-10 ENCOUNTER — Encounter: Payer: Self-pay | Admitting: Internal Medicine

## 2017-03-12 ENCOUNTER — Ambulatory Visit
Admission: RE | Admit: 2017-03-12 | Discharge: 2017-03-12 | Disposition: A | Discharge status: Home / Self Care | Payer: Medicare Other | Source: Ambulatory Visit | Attending: Specialist | Admitting: Specialist

## 2017-03-12 DIAGNOSIS — H538 Other visual disturbances: Secondary | ICD-10-CM

## 2017-03-12 DIAGNOSIS — R270 Ataxia, unspecified: Secondary | ICD-10-CM

## 2017-03-12 DIAGNOSIS — T887XXA Unspecified adverse effect of drug or medicament, initial encounter: Secondary | ICD-10-CM

## 2017-03-12 DIAGNOSIS — R531 Weakness: Secondary | ICD-10-CM

## 2017-03-16 ENCOUNTER — Telehealth: Payer: Self-pay | Admitting: Internal Medicine

## 2017-03-16 NOTE — Telephone Encounter (Signed)
Called pt; she didn't receive a coupon yet/ $50 coupon left with receptionist on 4th floor/pt plans to come by and pick up Angela/PV

## 2017-03-16 NOTE — Telephone Encounter (Signed)
[employer]  [phone]  Option 5 Needs the form completed that was faxed over on Friday for Prior Auth

## 2017-03-18 ENCOUNTER — Encounter: Payer: Self-pay | Admitting: Internal Medicine

## 2017-03-18 ENCOUNTER — Ambulatory Visit (AMBULATORY_SURGERY_CENTER): Payer: Medicare Other | Admitting: Internal Medicine

## 2017-03-18 VITALS — BP 126/90 | HR 69 | Temp 95.7°F | Resp 13 | Ht 66.0 in | Wt 229.0 lb

## 2017-03-18 DIAGNOSIS — D124 Benign neoplasm of descending colon: Secondary | ICD-10-CM

## 2017-03-18 DIAGNOSIS — K222 Esophageal obstruction: Secondary | ICD-10-CM | POA: Diagnosis not present

## 2017-03-18 DIAGNOSIS — K5730 Diverticulosis of large intestine without perforation or abscess without bleeding: Secondary | ICD-10-CM | POA: Diagnosis not present

## 2017-03-18 DIAGNOSIS — R1310 Dysphagia, unspecified: Secondary | ICD-10-CM

## 2017-03-18 DIAGNOSIS — K2270 Barrett's esophagus without dysplasia: Secondary | ICD-10-CM

## 2017-03-18 DIAGNOSIS — R197 Diarrhea, unspecified: Secondary | ICD-10-CM

## 2017-03-18 DIAGNOSIS — Z86010 Personal history of colonic polyps: Secondary | ICD-10-CM | POA: Diagnosis present

## 2017-03-18 DIAGNOSIS — R1319 Other dysphagia: Secondary | ICD-10-CM

## 2017-03-18 DIAGNOSIS — K219 Gastro-esophageal reflux disease without esophagitis: Secondary | ICD-10-CM | POA: Diagnosis not present

## 2017-03-18 MED ORDER — SODIUM CHLORIDE 0.9 % IV SOLN
500.0000 mL | INTRAVENOUS | Status: AC
Start: 2017-03-18 — End: 2018-03-18

## 2017-03-18 NOTE — Progress Notes (Signed)
Pt's states no medical or surgical changes since previsit or office visit. 

## 2017-03-18 NOTE — Progress Notes (Signed)
Spontaneous respirations throughout. VSS. Resting comfortably. To PACU on room air. Report to  Annette RN.  

## 2017-03-18 NOTE — Progress Notes (Signed)
Called to room to assist during endoscopic procedure.  Patient ID and intended procedure confirmed with present staff. Received instructions for my participation in the procedure from the performing physician.  

## 2017-03-18 NOTE — Op Note (Signed)
[place] Patient Name: Robin Bryan Procedure Date: 03/18/2017 7:34 AM MRN: [id number] Endoscopist: Docia Chuck. Henrene Pastor , MD Age: 66 Referring MD:  Date of Birth: Aug 05, 1951 Gender: Female Account #: [id number] Procedure:                Colonoscopy, with cold snare polypectomy x 1 and                            biopsies Indications:              High risk colon cancer surveillance: Personal                            history of multiple (3 or more) adenomas. Previous                            examinations 2005, 2008, in 2013, Incidental -                            Diarrhea Medicines:                Monitored Anesthesia Care Procedure:                Pre-Anesthesia Assessment:                           - Prior to the procedure, a History and Physical                            was performed, and patient medications and                            allergies were reviewed. The patient's tolerance of                            previous anesthesia was also reviewed. The risks                            and benefits of the procedure and the sedation                            options and risks were discussed with the patient.                            All questions were answered, and informed consent                            was obtained. Prior Anticoagulants: The patient has                            taken Plavix (clopidogrel), last dose was 6 days                            prior to procedure. ASA Grade Assessment: III - A  patient with severe systemic disease. After                            reviewing the risks and benefits, the patient was                            deemed in satisfactory condition to undergo the                            procedure.                           After obtaining informed consent, the colonoscope                            was passed under direct vision. Throughout the                            procedure, the patient's  blood pressure, pulse, and                            oxygen saturations were monitored continuously. The                            Model CF-HQ190L [id number]) scope was introduced                            through the anus and advanced to the the cecum,                            identified by appendiceal orifice and ileocecal                            valve. The ileocecal valve, appendiceal orifice,                            and rectum were photographed. The quality of the                            bowel preparation was good. The colonoscopy was                            performed without difficulty. The patient tolerated                            the procedure well. The bowel preparation used was                            SUPREP. Scope In: 8:27:53 AM Scope Out: 8:41:52 AM Scope Withdrawal Time: 0 hours 11 minutes 16 seconds  Total Procedure Duration: 0 hours 13 minutes 59 seconds  Findings:                 A 3 mm polyp was found in the descending colon. The  polyp was removed with a cold snare. Resection and                            retrieval were complete.                           Multiple diverticula were found in the left colon.                           Internal hemorrhoids were found during retroflexion.                           The entire examined colon appeared otherwise normal                            on direct and retroflexion views. Biopsies for                            histology were taken with a cold forceps from the                            entire colon for evaluation of microscopic colitis. Complications:            No immediate complications. Estimated blood loss:                            None. Estimated Blood Loss:     Estimated blood loss: none. Impression:               - One 3 mm polyp in the descending colon, removed                            with a cold snare. Resected and retrieved.                           -  Diverticulosis in the left colon.                           - Internal hemorrhoids.                           - The entire examined colon is otherwise normal on                            direct and retroflexion views. Recommendation:           - Repeat colonoscopy in 5 years for surveillance.                           - Patient has a contact number available for                            emergencies. The signs and symptoms of potential  delayed complications were discussed with the                            patient. Return to normal activities tomorrow.                            Written discharge instructions were provided to the                            patient.                           - Resume previous diet.                           - Continue present medications.                           - Await pathology results. Docia Chuck. Henrene Pastor, MD 03/18/2017 9:02:00 AM This report has been signed electronically.

## 2017-03-18 NOTE — Op Note (Signed)
[place] Patient Name: Robin Bryan Procedure Date: 03/18/2017 7:34 AM MRN: [id number] Endoscopist: Docia Chuck. Henrene Pastor , MD Age: 66 Referring MD:  Date of Birth: 1951-02-12 Gender: Female Account #: [id number] Procedure:                Upper GI endoscopy, with biopsies and Maloney                            dilation of the esophagus-52f Indications:              Dysphagia, Surveillance for malignancy due to                            personal history of Barrett's esophagus Medicines:                Monitored Anesthesia Care Procedure:                Pre-Anesthesia Assessment:                           - Prior to the procedure, a History and Physical                            was performed, and patient medications and                            allergies were reviewed. The patient's tolerance of                            previous anesthesia was also reviewed. The risks                            and benefits of the procedure and the sedation                            options and risks were discussed with the patient.                            All questions were answered, and informed consent                            was obtained. Prior Anticoagulants: The patient has                            taken Plavix (clopidogrel), last dose was 6 days                            prior to procedure. ASA Grade Assessment: III - A                            patient with severe systemic disease. After                            reviewing the risks and benefits, the patient was  deemed in satisfactory condition to undergo the                            procedure.                           After obtaining informed consent, the endoscope was                            passed under direct vision. Throughout the                            procedure, the patient's blood pressure, pulse, and                            oxygen saturations were monitored continuously. The                             Model GIF-HQ190 [id number]) scope was introduced                            through the mouth, and advanced to the second part                            of duodenum. The upper GI endoscopy was                            accomplished without difficulty. The patient                            tolerated the procedure well. Scope In: Scope Out: Findings:                 One moderate benign-appearing, intrinsic stenosis                            was found 35 cm from the incisors. This measured                            1.5 cm (inner diameter). The scope was withdrawn.                            Dilation was performed with a Maloney dilator with                            no resistance at 78 Fr. No resistance. Tolerated                            well.                           There were esophageal mucosal changes secondary to                            established short-segment Barrett's disease present  in the lower third of the esophagus. The maximum                            longitudinal extent of these mucosal changes was 1                            cm in length. Mucosa was biopsied with a cold                            forceps for histology. . See images                           The stomach was normal, save sliding hiatal hernia                            3 cm.                           The examined duodenum was normal.                           The cardia and gastric fundus were normal on                            retroflexion. Complications:            No immediate complications. Estimated Blood Loss:     Estimated blood loss: none. Impression:               - Benign-appearing esophageal stenosis. Dilated.                           - Esophageal mucosal changes secondary to                            established short-segment Barrett's disease.                            Biopsied.                           - Normal  stomach.                           - Normal examined duodenum. Recommendation:           - Patient has a contact number available for                            emergencies. The signs and symptoms of potential                            delayed complications were discussed with the                            patient. Return to normal activities tomorrow.  Written discharge instructions were provided to the                            patient.                           - Resume previous diet.                           - Continue present medications.                           - Await pathology results.                           - Resume Plavix (clopidogrel) at prior dose today.                           - Repeat EGD with biopsies in 5 years if negative                            for dysplasia Docia Chuck. Henrene Pastor, MD 03/18/2017 9:07:19 AM This report has been signed electronically.

## 2017-03-18 NOTE — Patient Instructions (Addendum)
YOU HAD AN ENDOSCOPIC PROCEDURE TODAY AT [facility]:   Refer to the procedure report that was given to you for any specific questions about what was found during the examination.  If the procedure report does not answer your questions, please call your gastroenterologist to clarify.  If you requested that your care partner not be given the details of your procedure findings, then the procedure report has been included in a sealed envelope for you to review at your convenience later.  YOU SHOULD EXPECT: Some feelings of bloating in the abdomen. Passage of more gas than usual.  Walking can help get rid of the air that was put into your GI tract during the procedure and reduce the bloating. If you had a lower endoscopy (such as a colonoscopy or flexible sigmoidoscopy) you may notice spotting of blood in your stool or on the toilet paper. If you underwent a bowel prep for your procedure, you may not have a normal bowel movement for a few days.  Please Note:  You might notice some irritation and congestion in your nose or some drainage.  This is from the oxygen used during your procedure.  There is no need for concern and it should clear up in a day or so.  SYMPTOMS TO REPORT IMMEDIATELY:   Following lower endoscopy (colonoscopy or flexible sigmoidoscopy):  Excessive amounts of blood in the stool  Significant tenderness or worsening of abdominal pains  Swelling of the abdomen that is new, acute  Fever of 100F or higher   Following upper endoscopy (EGD)  Vomiting of blood or coffee ground material  New chest pain or pain under the shoulder blades  Painful or persistently difficult swallowing  New shortness of breath  Fever of 100F or higher  Black, tarry-looking stools  For urgent or emergent issues, a gastroenterologist can be reached at any hour by calling [phone].   DIET:  Please follow the dilatation diet the rest of today. Handout was given to your care partner.    Drink plenty of fluids but you should avoid alcoholic beverages for 24 hours.  ACTIVITY:  You should plan to take it easy for the rest of today and you should NOT DRIVE or use heavy machinery until tomorrow (because of the sedation medicines used during the test).    FOLLOW UP: Our staff will call the number listed on your records the next business day following your procedure to check on you and address any questions or concerns that you may have regarding the information given to you following your procedure. If we do not reach you, we will leave a message.  However, if you are feeling well and you are not experiencing any problems, there is no need to return our call.  We will assume that you have returned to your regular daily activities without incident.  If any biopsies were taken you will be contacted by phone or by letter within the next 1-3 weeks.  Please call us at [phone] if you have not heard about the biopsies in 3 weeks.    SIGNATURES/CONFIDENTIALITY: You and/or your care partner have signed paperwork which will be entered into your electronic medical record.  These signatures attest to the fact that that the information above on your After Visit Summary has been reviewed and is understood.  Full responsibility of the confidentiality of this discharge information lies with you and/or your care-partner.   Handouts were given to your care partner on a hiatal  hernia, esophageal dilatation diet, Barrett's esophagus,polyps, diverticulosis, hemorrhoids, and a high fiber diet with liberal fluid intake. Per Dr. Henrene Pastor resume your plavix at prior dose today. You may resume your current medications today. Await biopsy results. Please call if any questions or concerns.

## 2017-03-18 NOTE — Progress Notes (Signed)
No problems noted in the recovery room. maw 

## 2017-03-19 ENCOUNTER — Telehealth: Payer: Self-pay

## 2017-03-19 NOTE — Telephone Encounter (Signed)
  Follow up Call-  Call back number 03/18/2017 04/05/2015  Post procedure Call Back phone  # [phone]  Permission to leave phone message Yes Yes  Some recent data might be hidden     Patient questions:  Do you have a fever, pain , or abdominal swelling? No. Pain Score  0 *  Have you tolerated food without any problems? Yes.    Have you been able to return to your normal activities? Yes.    Do you have any questions about your discharge instructions: Diet   No. Medications  No. Follow up visit  No.  Do you have questions or concerns about your Care? No.  Actions: * If pain score is 4 or above: No action needed, pain <4.  Per Pt yesterday pt said she did fever 101 and had chills.  She called the on call MD who told her to treat fever with tylenol.  This am pt reported she was "back to normal". No fever or chills this am. maw

## 2017-03-24 ENCOUNTER — Encounter: Payer: Self-pay | Admitting: Internal Medicine

## 2017-03-25 ENCOUNTER — Other Ambulatory Visit: Payer: Self-pay | Admitting: Family Medicine

## 2017-03-25 DIAGNOSIS — I10 Essential (primary) hypertension: Secondary | ICD-10-CM

## 2017-04-03 ENCOUNTER — Encounter: Payer: Self-pay | Admitting: Family Medicine

## 2017-04-03 ENCOUNTER — Ambulatory Visit (INDEPENDENT_AMBULATORY_CARE_PROVIDER_SITE_OTHER): Payer: Medicare Other | Admitting: Family Medicine

## 2017-04-03 VITALS — BP 128/76 | HR 93 | Temp 97.9°F | Resp 16 | Ht 66.0 in | Wt 232.0 lb

## 2017-04-03 DIAGNOSIS — Z1159 Encounter for screening for other viral diseases: Secondary | ICD-10-CM

## 2017-04-03 DIAGNOSIS — Z23 Encounter for immunization: Secondary | ICD-10-CM

## 2017-04-03 DIAGNOSIS — Z0000 Encounter for general adult medical examination without abnormal findings: Secondary | ICD-10-CM | POA: Diagnosis not present

## 2017-04-03 DIAGNOSIS — F3341 Major depressive disorder, recurrent, in partial remission: Secondary | ICD-10-CM | POA: Diagnosis not present

## 2017-04-03 LAB — CBC WITH DIFFERENTIAL/PLATELET
BASOS PCT: 1 %
Basophils Absolute: 73 cells/uL (ref 0–200)
Eosinophils Absolute: 292 cells/uL (ref 15–500)
Eosinophils Relative: 4 %
HEMATOCRIT: 39.9 % (ref 35.0–45.0)
HEMOGLOBIN: 13.3 g/dL (ref 11.7–15.5)
LYMPHS ABS: 2117 {cells}/uL (ref 850–3900)
Lymphocytes Relative: 29 %
MCH: 30.9 pg (ref 27.0–33.0)
MCHC: 33.3 g/dL (ref 32.0–36.0)
MCV: 92.6 fL (ref 80.0–100.0)
MONO ABS: 438 {cells}/uL (ref 200–950)
MPV: 8.5 fL (ref 7.5–12.5)
Monocytes Relative: 6 %
NEUTROS ABS: 4380 {cells}/uL (ref 1500–7800)
Neutrophils Relative %: 60 %
Platelets: 320 10*3/uL (ref 140–400)
RBC: 4.31 MIL/uL (ref 3.80–5.10)
RDW: 13.3 % (ref 11.0–15.0)
WBC: 7.3 10*3/uL (ref 3.8–10.8)

## 2017-04-03 MED ORDER — ESCITALOPRAM OXALATE 10 MG PO TABS: 10.0000 mg | ORAL_TABLET | Freq: Every day | ORAL | 0 refills | Status: DC

## 2017-04-03 MED ORDER — VENLAFAXINE HCL ER 37.5 MG PO CP24: 37.5000 mg | ORAL_CAPSULE | Freq: Every day | ORAL | 0 refills | Status: DC

## 2017-04-03 NOTE — Progress Notes (Signed)
Name: Robin Bryan   MRN: [id number]    DOB: 1951-01-08   Date:04/03/2017       Progress Note  Subjective  Chief Complaint  Chief Complaint  Patient presents with  . Annual Exam    CPE    Depression         This is a recurrent problem.The problem is unchanged.  Associated symptoms include fatigue, irritable, restlessness and sad.  Associated symptoms include no helplessness, no hopelessness and no headaches (frequent headaches.).  Past treatments include SNRIs - Serotonin and norepinephrine reuptake inhibitors.  Compliance with treatment is good.  Past compliance problems include medication issues (feels nauseated on Venlafaxine.).  Previous treatment provided no relief (still feel irritable, sometimes sad, her husband thinks she is not symptoms free) relief.   Pt. Presents for Complete Physical Exam.  Her last colonoscopy was July 2018, repeat in 2023. Her mammogram was BI-RADS 1 in February 2018. She had a complete hysterectomy due to uterine fibroids, does not need cervical cancer screening.  She is due for Hepatitis C screening.   Past Medical History:  Diagnosis Date  . Anxiety   . Arthritis   . Asthma   . Barrett's esophagus   . Bronchitis   . Depression   . Diverticulosis   . Esophageal stricture   . Fibromyalgia   . GERD (gastroesophageal reflux disease)   . Hx of adenomatous colonic polyps   . Hyperlipidemia   . Hypertension   . Intramural leiomyoma of uterus   . Myocardial infarction ([place])   . Stroke [facility])     Past Surgical History:  Procedure Laterality Date  . ABDOMINAL HYSTERECTOMY    . INCONTINENCE SURGERY    . TUBAL LIGATION    . tumor excision ovaries      Family History  Problem Relation Age of Onset  . Ovarian cancer Sister   . Cancer Sister   . Diabetes Sister   . Heart disease Sister   . Lung cancer Sister   . Colon polyps Mother   . Diabetes Mother   . Parkinson's disease Cousin   . Cancer Father        lung  . Diabetes Daughter   .  Colon cancer Neg Hx   . Esophageal cancer Neg Hx   . Pancreatic cancer Neg Hx   . Stomach cancer Neg Hx     Social History   Social History  . Marital status: Married    Spouse name: N/A  . Number of children: N/A  . Years of education: N/A   Occupational History  . Not on file.   Social History Main Topics  . Smoking status: Former Smoker    Packs/day: 0.50    Years: 8.00    Types: Cigarettes    Quit date: 05/27/2011  . Smokeless tobacco: Never Used  . Alcohol use No  . Drug use: No  . Sexual activity: Not on file   Other Topics Concern  . Not on file   Social History Narrative  . No narrative on file     Current Outpatient Prescriptions:  .  ALPRAZolam (XANAX) 0.5 MG tablet, Take 1 tablet (0.5 mg total) by mouth 2 (two) times daily as needed for anxiety., Disp: 60 tablet, Rfl: 2 .  clopidogrel (PLAVIX) 75 MG tablet, TAKE 1 TABLET(75 MG TOTAL) BY MOUTH DAILY., Disp: 90 tablet, Rfl: 3 .  clotrimazole-betamethasone (LOTRISONE) cream, Apply 1 application topically 2 (two) times daily., Disp: 60 g, Rfl: 2 .  diphenhydrAMINE (BENADRYL) 25 mg capsule, Take 1 capsule (25 mg total) by mouth every 8 (eight) hours as needed., Disp: 30 capsule, Rfl: 0 .  EPINEPHrine 0.3 mg/0.3 mL IJ SOAJ injection, Inject 0.3 mLs (0.3 mg total) into the muscle once., Disp: 1 Device, Rfl: 0 .  gabapentin (NEURONTIN) 800 MG tablet, TK 1 T PO QID, Disp: , Rfl: 6 .  losartan (COZAAR) 100 MG tablet, TAKE 1 TABLET BY MOUTH EVERY DAY, Disp: 7 tablet, Rfl: 0 .  losartan-hydrochlorothiazide (HYZAAR) 100-12.5 MG tablet, TAKE 1 TABLET BY MOUTH DAILY, Disp: 90 tablet, Rfl: 0 .  MYRBETRIQ 50 MG TB24 tablet, TK 1 T PO QD, Disp: , Rfl: 3 .  oxyCODONE-acetaminophen (PERCOCET/ROXICET) 5-325 MG tablet, Take 1 tablet by mouth daily as needed for severe pain., Disp: , Rfl:  .  pantoprazole (PROTONIX) 40 MG tablet, Take 1 tablet (40 mg total) by mouth 2 (two) times daily., Disp: 60 tablet, Rfl: 3 .  primidone  (MYSOLINE) 50 MG tablet, Take 1 tablet by mouth daily., Disp: , Rfl: 6 .  rosuvastatin (CRESTOR) 10 MG tablet, TAKE 1 TABLET(10 MG) BY MOUTH DAILY, Disp: 90 tablet, Rfl: 0 .  solifenacin (VESICARE) 10 MG tablet, Take 10 mg by mouth daily., Disp: , Rfl:  .  tiZANidine (ZANAFLEX) 2 MG tablet, Take 1 tablet (2 mg total) by mouth every 8 (eight) hours as needed for muscle spasms., Disp: 30 tablet, Rfl: 0 .  venlafaxine XR (EFFEXOR-XR) 75 MG 24 hr capsule, TAKE 1 CAPSULE(75 MG) BY MOUTH DAILY WITH BREAKFAST, Disp: 90 capsule, Rfl: 0  Current Facility-Administered Medications:  .  0.9 %  sodium chloride infusion, 500 mL, Intravenous, Continuous, Irene Shipper, MD  Allergies  Allergen Reactions  . Vicodin [Hydrocodone-Acetaminophen] Nausea Only     Review of Systems  Constitutional: Positive for fatigue and malaise/fatigue. Negative for chills and fever.  HENT: Negative for congestion, ear pain and sore throat.   Eyes: Negative for blurred vision (wears glasses, needs to have her vision checked.) and double vision.  Respiratory: Positive for wheezing. Negative for cough and shortness of breath.   Cardiovascular: Negative for chest pain, palpitations and leg swelling.  Gastrointestinal: Positive for diarrhea and nausea. Negative for abdominal pain, blood in stool, constipation and vomiting.  Genitourinary: Negative for dysuria and hematuria.  Musculoskeletal: Negative for back pain and neck pain.  Skin: Negative for itching and rash.  Neurological: Positive for dizziness. Negative for headaches (frequent headaches.).  Psychiatric/Behavioral: Positive for depression. The patient is not nervous/anxious.      Objective  Vitals:   04/03/17 1055  BP: 128/76  Pulse: 93  Resp: 16  Temp: 97.9 F (36.6 C)  TempSrc: Oral  SpO2: 96%  Weight: 232 lb (105.2 kg)  Height: 5\' 6"  (1.676 m)    Physical Exam  Constitutional: She is oriented to person, place, and time and well-developed,  well-nourished, and in no distress. She is irritable.  HENT:  Head: Normocephalic and atraumatic.  Right Ear: External ear normal.  Left Ear: External ear normal.  Mouth/Throat: Oropharynx is clear and moist.  Eyes: Conjunctivae and EOM are normal.  Cardiovascular: Normal rate, regular rhythm and normal heart sounds.   No murmur heard. Pulmonary/Chest: Effort normal and breath sounds normal. She has no wheezes.  Abdominal: Soft. Bowel sounds are normal. There is no tenderness.  Musculoskeletal: She exhibits no edema.  Neurological: She is alert and oriented to person, place, and time.  Psychiatric: Mood, memory, affect and judgment normal.  Nursing note and vitals reviewed.       Assessment & Plan  1. Well woman exam without gynecological exam Obtain age-appropriate laboratory screening - CBC with Differential/Platelet - TSH - VITAMIN D 25 Hydroxy (Vit-D Deficiency, Fractures)  2. Need for hepatitis C screening test  - Hepatitis C antibody  3. Recurrent major depressive disorder, in partial remission ([place]) DC venlafaxine by tapering off because of lack of efficacy and possible side effects. We will start on Lexapro 10 mg daily, reassess in 6 weeks - escitalopram (LEXAPRO) 10 MG tablet; Take 1 tablet (10 mg total) by mouth daily.  Dispense: 90 tablet; Refill: 0 - venlafaxine XR (EFFEXOR-XR) 37.5 MG 24 hr capsule; Take 1 capsule (37.5 mg total) by mouth daily with breakfast.  Dispense: 5 capsule; Refill: 0  4. Need for Tdap vaccination  - Tdap vaccine greater than or equal to 7yo IM  - escitalopram (LEXAPRO) 10 MG tablet; Take 1 tablet (10 mg total) by mouth daily.  Dispense: 90 tablet; Refill: 0   Willistine Ferrall Asad A[facility] 04/03/2017 11:06 AM

## 2017-04-04 LAB — HEPATITIS C ANTIBODY: HCV AB: NONREACTIVE

## 2017-04-04 LAB — VITAMIN D 25 HYDROXY (VIT D DEFICIENCY, FRACTURES): VIT D 25 HYDROXY: 36 ng/mL (ref 30–100)

## 2017-04-04 LAB — TSH: TSH: 1.02 m[IU]/L

## 2017-04-05 ENCOUNTER — Other Ambulatory Visit: Payer: Self-pay | Admitting: Gastroenterology

## 2017-04-13 ENCOUNTER — Other Ambulatory Visit: Payer: Self-pay | Admitting: Family Medicine

## 2017-04-13 DIAGNOSIS — F3342 Major depressive disorder, recurrent, in full remission: Secondary | ICD-10-CM

## 2017-04-27 ENCOUNTER — Telehealth: Payer: Self-pay | Admitting: Family Medicine

## 2017-04-27 NOTE — Telephone Encounter (Signed)
Pt states that she received a letter from [facility] stating that they will no longer cover Dr Manuella Ghazi area. Pt is asking for recommendations for the [place] area. [phone]

## 2017-04-28 NOTE — Telephone Encounter (Signed)
Spoke with patient and she stated [employer] called her back and stated [id number] was a mistake on their part and they do cover Dr. Trena Bryan area so she is satisfied now and will continue to see Dr. Manuella Bryan

## 2017-05-07 ENCOUNTER — Other Ambulatory Visit: Payer: Self-pay | Admitting: Internal Medicine

## 2017-05-11 ENCOUNTER — Other Ambulatory Visit: Payer: Self-pay | Admitting: Family Medicine

## 2017-05-11 DIAGNOSIS — E782 Mixed hyperlipidemia: Secondary | ICD-10-CM

## 2017-05-12 ENCOUNTER — Encounter: Payer: Self-pay | Admitting: Family Medicine

## 2017-05-12 ENCOUNTER — Ambulatory Visit (INDEPENDENT_AMBULATORY_CARE_PROVIDER_SITE_OTHER): Payer: Medicare Other | Admitting: Family Medicine

## 2017-05-12 DIAGNOSIS — F410 Panic disorder [episodic paroxysmal anxiety] without agoraphobia: Secondary | ICD-10-CM

## 2017-05-12 DIAGNOSIS — F3341 Major depressive disorder, recurrent, in partial remission: Secondary | ICD-10-CM

## 2017-05-12 MED ORDER — ALPRAZOLAM 0.5 MG PO TABS: 0.5000 mg | ORAL_TABLET | Freq: Two times a day (BID) | ORAL | 2 refills | Status: DC | PRN

## 2017-05-12 MED ORDER — ESCITALOPRAM OXALATE 10 MG PO TABS: 10.0000 mg | ORAL_TABLET | Freq: Every day | ORAL | 0 refills | Status: DC

## 2017-05-12 NOTE — Progress Notes (Signed)
Name: Robin Bryan   MRN: [id number]    DOB: Jun 06, 1951   Date:05/12/2017       Progress Note  Subjective  Chief Complaint  Chief Complaint  Patient presents with  . medication follow-up    Lexapro     Depression         This is a recurrent problem.  The onset quality is gradual.   The problem has been gradually improving since onset.  Associated symptoms include no fatigue, no helplessness, no hopelessness, does not have insomnia, no body aches and not sad.  Past treatments include SSRIs - Selective serotonin reuptake inhibitors.  Past medical history includes anxiety.   Anxiety  Presents for follow-up visit. Symptoms include panic. Patient reports no chest pain, depressed mood, insomnia or irritability. The severity of symptoms is moderate.       Past Medical History:  Diagnosis Date  . Anxiety   . Arthritis   . Asthma   . Barrett's esophagus   . Bronchitis   . Depression   . Diverticulosis   . Esophageal stricture   . Fibromyalgia   . GERD (gastroesophageal reflux disease)   . Hx of adenomatous colonic polyps   . Hyperlipidemia   . Hypertension   . Intramural leiomyoma of uterus   . Myocardial infarction ([place])   . Stroke [facility])     Past Surgical History:  Procedure Laterality Date  . ABDOMINAL HYSTERECTOMY    . INCONTINENCE SURGERY    . TUBAL LIGATION    . tumor excision ovaries      Family History  Problem Relation Age of Onset  . Ovarian cancer Sister   . Cancer Sister   . Diabetes Sister   . Heart disease Sister   . Lung cancer Sister   . Colon polyps Mother   . Diabetes Mother   . Parkinson's disease Cousin   . Cancer Father        lung  . Diabetes Daughter   . Colon cancer Neg Hx   . Esophageal cancer Neg Hx   . Pancreatic cancer Neg Hx   . Stomach cancer Neg Hx     Social History   Social History  . Marital status: Married    Spouse name: N/A  . Number of children: N/A  . Years of education: N/A   Occupational History  . Not on  file.   Social History Main Topics  . Smoking status: Former Smoker    Packs/day: 0.50    Years: 8.00    Types: Cigarettes    Quit date: 05/27/2011  . Smokeless tobacco: Never Used  . Alcohol use No  . Drug use: No  . Sexual activity: Not on file   Other Topics Concern  . Not on file   Social History Narrative  . No narrative on file     Current Outpatient Prescriptions:  .  ALPRAZolam (XANAX) 0.5 MG tablet, Take 1 tablet (0.5 mg total) by mouth 2 (two) times daily as needed for anxiety., Disp: 60 tablet, Rfl: 2 .  clopidogrel (PLAVIX) 75 MG tablet, TAKE 1 TABLET(75 MG TOTAL) BY MOUTH DAILY., Disp: 90 tablet, Rfl: 3 .  clotrimazole-betamethasone (LOTRISONE) cream, Apply 1 application topically 2 (two) times daily., Disp: 60 g, Rfl: 2 .  diphenhydrAMINE (BENADRYL) 25 mg capsule, Take 1 capsule (25 mg total) by mouth every 8 (eight) hours as needed., Disp: 30 capsule, Rfl: 0 .  EPINEPHrine 0.3 mg/0.3 mL IJ SOAJ injection, Inject 0.3 mLs (0.3  mg total) into the muscle once., Disp: 1 Device, Rfl: 0 .  escitalopram (LEXAPRO) 10 MG tablet, Take 1 tablet (10 mg total) by mouth daily., Disp: 90 tablet, Rfl: 0 .  gabapentin (NEURONTIN) 800 MG tablet, TK 1 T PO QID, Disp: , Rfl: 6 .  losartan (COZAAR) 100 MG tablet, TAKE 1 TABLET BY MOUTH EVERY DAY, Disp: 7 tablet, Rfl: 0 .  losartan-hydrochlorothiazide (HYZAAR) 100-12.5 MG tablet, TAKE 1 TABLET BY MOUTH DAILY, Disp: 90 tablet, Rfl: 0 .  MYRBETRIQ 50 MG TB24 tablet, TK 1 T PO QD, Disp: , Rfl: 3 .  oxyCODONE-acetaminophen (PERCOCET/ROXICET) 5-325 MG tablet, Take 1 tablet by mouth daily as needed for severe pain., Disp: , Rfl:  .  pantoprazole (PROTONIX) 40 MG tablet, TAKE 1 TABLET(40 MG) BY MOUTH TWICE DAILY, Disp: 60 tablet, Rfl: 3 .  primidone (MYSOLINE) 50 MG tablet, Take 1 tablet by mouth daily., Disp: , Rfl: 6 .  rosuvastatin (CRESTOR) 10 MG tablet, TAKE 1 TABLET(10 MG) BY MOUTH DAILY, Disp: 90 tablet, Rfl: 0 .  tiZANidine (ZANAFLEX) 4  MG tablet, Take 1 tablet by mouth daily., Disp: , Rfl: 5 .  ibuprofen (ADVIL,MOTRIN) 800 MG tablet, Take 1 tablet by mouth daily as needed., Disp: , Rfl: 1 .  solifenacin (VESICARE) 10 MG tablet, Take 10 mg by mouth daily., Disp: , Rfl:  .  tiZANidine (ZANAFLEX) 2 MG tablet, Take 1 tablet (2 mg total) by mouth every 8 (eight) hours as needed for muscle spasms. (Patient not taking: Reported on 05/12/2017), Disp: 30 tablet, Rfl: 0 .  venlafaxine XR (EFFEXOR-XR) 37.5 MG 24 hr capsule, Take 1 capsule (37.5 mg total) by mouth daily with breakfast., Disp: 5 capsule, Rfl: 0  Current Facility-Administered Medications:  .  0.9 %  sodium chloride infusion, 500 mL, Intravenous, Continuous, Irene Shipper, MD  Allergies  Allergen Reactions  . Vicodin [Hydrocodone-Acetaminophen] Nausea Only     Review of Systems  Constitutional: Negative for fatigue and irritability.  Cardiovascular: Negative for chest pain.  Psychiatric/Behavioral: Positive for depression. The patient does not have insomnia.      Objective  Vitals:   05/12/17 1501  BP: 124/72  Pulse: 89  Resp: 16  Temp: 98.4 F (36.9 C)  TempSrc: Oral  SpO2: 96%  Weight: 234 lb 8 oz (106.4 kg)  Height: 5' 6" (1.676 m)    Physical Exam  Constitutional: She is oriented to person, place, and time and well-developed, well-nourished, and in no distress.  HENT:  Head: Normocephalic and atraumatic.  Cardiovascular: Normal rate, regular rhythm and normal heart sounds.   No murmur heard. Pulmonary/Chest: Effort normal and breath sounds normal. She has no wheezes.  Neurological: She is alert and oriented to person, place, and time.  Psychiatric: Mood, memory, affect and judgment normal.  Nursing note and vitals reviewed.  Recent Results (from the past 2160 hour(s))  Lipid panel     Status: Abnormal   Collection Time: 02/12/17  9:09 AM  Result Value Ref Range   Cholesterol 130 <200 mg/dL   Triglycerides 173 (H) <150 mg/dL   HDL 32 (L)  >50 mg/dL   Total CHOL/HDL Ratio 4.1 <5.0 Ratio   VLDL 35 (H) <30 mg/dL   LDL Cholesterol 63 <100 mg/dL  CBC with Differential/Platelet     Status: None   Collection Time: 04/03/17 11:36 AM  Result Value Ref Range   WBC 7.3 3.8 - 10.8 K/uL   RBC 4.31 3.80 - 5.10 MIL/uL   Hemoglobin 13.3  11.7 - 15.5 g/dL   HCT 39.9 35.0 - 45.0 %   MCV 92.6 80.0 - 100.0 fL   MCH 30.9 27.0 - 33.0 pg   MCHC 33.3 32.0 - 36.0 g/dL   RDW 13.3 11.0 - 15.0 %   Platelets 320 140 - 400 K/uL   MPV 8.5 7.5 - 12.5 fL   Neutro Abs 4,380 1,500 - 7,800 cells/uL   Lymphs Abs 2,117 850 - 3,900 cells/uL   Monocytes Absolute 438 200 - 950 cells/uL   Eosinophils Absolute 292 15 - 500 cells/uL   Basophils Absolute 73 0 - 200 cells/uL   Neutrophils Relative % 60 %   Lymphocytes Relative 29 %   Monocytes Relative 6 %   Eosinophils Relative 4 %   Basophils Relative 1 %   Smear Review Criteria for review not met   TSH     Status: None   Collection Time: 04/03/17 11:36 AM  Result Value Ref Range   TSH 1.02 mIU/L    Comment:   Reference Range   > or = 20 Years  0.40-4.50   Pregnancy Range First trimester  0.26-2.66 Second trimester 0.55-2.73 Third trimester  0.43-2.91     VITAMIN D 25 Hydroxy (Vit-D Deficiency, Fractures)     Status: None   Collection Time: 04/03/17 11:36 AM  Result Value Ref Range   Vit D, 25-Hydroxy 36 30 - 100 ng/mL    Comment: Vitamin D Status           25-OH Vitamin D        Deficiency                <20 ng/mL        Insufficiency         20 - 29 ng/mL        Optimal             > or = 30 ng/mL   For 25-OH Vitamin D testing on patients on D2-supplementation and patients for whom quantitation of D2 and D3 fractions is required, the QuestAssureD 25-OH VIT D, (D2,D3), LC/MS/MS is recommended: order code [id number] (patients > 2 yrs).   Hepatitis C antibody     Status: None   Collection Time: 04/03/17 11:36 AM  Result Value Ref Range   HCV Ab NON-REACTIVE NON-REACTIVE    Comment:                                                                         This test is for screening purposes only.  Reactive results should be confirmed by an alternative method.  Suggest HCV Qualitative, PCR, test code 83130.  Specimens will be stable for reflex testing up to 3 days after collection.      Assessment & Plan  1. Recurrent major depressive disorder, in partial remission (HCC) Stable, major depression in remission, continue Lexapro, refills provided - escitalopram (LEXAPRO) 10 MG tablet; Take 1 tablet (10 mg total) by mouth daily.  Dispense: 90 tablet; Refill: 0  2. Panic disorder without agoraphobia Symptoms of panic disorder are controlled on alprazolam taken twice a day when necessary, refills provided - ALPRAZolam (XANAX) 0.5 MG tablet; Take 1 tablet (0.5 mg total) by mouth 2 (  two) times daily as needed for anxiety.  Dispense: 60 tablet; Refill: 2   Kamesha Herne [facility] 05/12/2017 3:15 PM

## 2017-05-15 ENCOUNTER — Ambulatory Visit: Payer: Medicare Other | Admitting: Family Medicine

## 2017-06-05 ENCOUNTER — Ambulatory Visit (INDEPENDENT_AMBULATORY_CARE_PROVIDER_SITE_OTHER): Payer: Medicare Other | Admitting: Podiatry

## 2017-06-05 ENCOUNTER — Ambulatory Visit (INDEPENDENT_AMBULATORY_CARE_PROVIDER_SITE_OTHER): Payer: Medicare Other

## 2017-06-05 DIAGNOSIS — S99922A Unspecified injury of left foot, initial encounter: Secondary | ICD-10-CM

## 2017-06-05 DIAGNOSIS — B070 Plantar wart: Secondary | ICD-10-CM

## 2017-06-05 NOTE — Progress Notes (Signed)
   HPI: Established patient presents today for a few different new complaints regarding her bilateral feet. Patient states that she has a painful wart-like lesion to the plantar aspect of the right forefoot. There's been ongoing for approximately 1 year now. Patient was last seen on 05/27/2016 here in the office. Patient states that her primary care physician leaves it was a possible wart to the plantar aspect of the right forefoot. Patient also complains of some bruising and ecchymosis to the medial aspect of the left foot. She denies any trauma. She denies pain. She states that the bruising is been ongoing for several weeks now. Patient notices swelling when she is on her feet for long periods of time.  Past Medical History:  Diagnosis Date  . Anxiety   . Arthritis   . Asthma   . Barrett's esophagus   . Bronchitis   . Depression   . Diverticulosis   . Esophageal stricture   . Fibromyalgia   . GERD (gastroesophageal reflux disease)   . Hx of adenomatous colonic polyps   . Hyperlipidemia   . Hypertension   . Intramural leiomyoma of uterus   . Myocardial infarction ([place])   . Stroke [facility])      Physical Exam: General: The patient is alert and oriented x3 in no acute distress.  Dermatology: Hyperkeratotic lesion noted to the plantar aspect of the right forefoot with pinpoint bleeding upon debridement consistent with a possible plantar verruca. Skin is warm, dry and supple bilateral lower extremities. Negative for open lesions or macerations.  Vascular: Ecchymosis with mild edema noted to the medial aspect of the left foot. Palpable pedal pulses bilaterally. No edema or erythema noted. Capillary refill within normal limits.  Neurological: Epicritic and protective threshold grossly intact bilaterally.   Musculoskeletal Exam: Range of motion within normal limits to all pedal and ankle joints bilateral. Muscle strength 5/5 in all groups bilateral.   Radiographic Exam:  There does appear  to be some irregularity to the navicular tuberosity. Possible fracture, chronic in nature with callus formation. This irregularity is nondisplaced and the osseous structures are otherwise good alignment  Assessment: -  Old, chronic Navicular tuberosity fracture left - Ecchymosis with mild edema left foot - Plantar wart right forefoot   Plan of Care:  - Patient was evaluated today. X-rays reviewed. - Excisional debridement of the wart was performed using a chisel blade with chemical application of salicylic acid applied to the ulceration site. Dry sterile dressing was applied - Due to the non-symptomatically nature of the patient's left foot symptoms, patient can continue wearing good supportive shoe gear. X-rays were reviewed today with the patient. -  Return to clinic when necessary   Edrick Kins, DPM [facility]  Dr. Edrick Kins, DPM    [address], [place] [address]                Office [phone]  Fax [phone]

## 2017-06-15 ENCOUNTER — Other Ambulatory Visit: Payer: Self-pay | Admitting: Family Medicine

## 2017-06-15 DIAGNOSIS — I10 Essential (primary) hypertension: Secondary | ICD-10-CM

## 2017-06-15 DIAGNOSIS — F3341 Major depressive disorder, recurrent, in partial remission: Secondary | ICD-10-CM

## 2017-06-23 ENCOUNTER — Telehealth: Payer: Self-pay | Admitting: Family Medicine

## 2017-06-23 NOTE — Telephone Encounter (Signed)
Copied from [place] #[id number]. Topic: Quick Communication - See Telephone Encounter >> Jun 23, 2017  1:54 PM Vernona Rieger wrote: CRM for notification. See Telephone encounter for:  06/23/17. Patient is asking about the pheunomia and shingles shot.

## 2017-06-29 NOTE — Telephone Encounter (Signed)
Left a message asking what concerns and questions does she have regarding those shots. I did mention in the message we are currently on back order with the shingles shot and only have the shots for the pt who need the 2nd dose.

## 2017-07-01 ENCOUNTER — Telehealth: Payer: Self-pay | Admitting: Family Medicine

## 2017-07-01 NOTE — Telephone Encounter (Unsigned)
Copied from [place] [id number]. Topic: Inquiry >> Jul 01, 2017  3:26 PM Neva Seat wrote: Pt is returning missed call from practice.  Wants to speak to someone about Shingles and Pheumonia shots  Please call any time after 10 am

## 2017-07-02 NOTE — Telephone Encounter (Signed)
Schendt should schedule an appointment to be evaluated for bronchitis. In general, if someone has an infectious disease it is better to defer vaccinations until they're stable and afebrile and back to their baseline.

## 2017-07-02 NOTE — Telephone Encounter (Signed)
Dr. Manuella Ghazi Pt would like to have the pneumonia shot. Pt has bronchitis. Can she schedule a nurse visit for a this shot ?

## 2017-07-06 ENCOUNTER — Ambulatory Visit (INDEPENDENT_AMBULATORY_CARE_PROVIDER_SITE_OTHER): Payer: Medicare Other

## 2017-07-06 ENCOUNTER — Telehealth: Payer: Self-pay

## 2017-07-06 DIAGNOSIS — Z23 Encounter for immunization: Secondary | ICD-10-CM | POA: Diagnosis not present

## 2017-07-06 DIAGNOSIS — Z8679 Personal history of other diseases of the circulatory system: Secondary | ICD-10-CM | POA: Diagnosis not present

## 2017-07-06 DIAGNOSIS — Z8709 Personal history of other diseases of the respiratory system: Secondary | ICD-10-CM | POA: Diagnosis not present

## 2017-07-06 MED ORDER — PNEUMOCOCCAL 13-VAL CONJ VACC IM SUSP: 0.5000 mL | INTRAMUSCULAR | Status: DC

## 2017-07-06 NOTE — Telephone Encounter (Signed)
Spoke to pt about her not getting the pnuemonia shot due to her having bronchitis (referring to the message below), she stated that she does not have bronchitis. She stated that she has a hx of it. I then informed her that if she is not sick then she is more than welcome to get the shot. Pt will come by today to get it done

## 2017-07-06 NOTE — Telephone Encounter (Signed)
Copied from [place] [id number]. Topic: Inquiry >> Jul 06, 2017 10:18 AM Conception Chancy, NT wrote: Reason for CRM: pt states she missed a call. I do not see a CRM to go off of, please contact pt.

## 2017-07-10 ENCOUNTER — Other Ambulatory Visit: Payer: Self-pay | Admitting: Internal Medicine

## 2017-08-19 ENCOUNTER — Other Ambulatory Visit: Payer: Self-pay | Admitting: Family Medicine

## 2017-08-19 DIAGNOSIS — E782 Mixed hyperlipidemia: Secondary | ICD-10-CM

## 2017-08-20 ENCOUNTER — Other Ambulatory Visit: Payer: Self-pay

## 2017-08-20 DIAGNOSIS — E782 Mixed hyperlipidemia: Secondary | ICD-10-CM

## 2017-08-20 MED ORDER — ROSUVASTATIN CALCIUM 10 MG PO TABS: ORAL_TABLET | ORAL | 0 refills | Status: DC

## 2017-08-20 NOTE — Telephone Encounter (Signed)
I will provide a 30 day supply for the patient but she will need to come in on her schedule apt date 09/10/2017 in order to get the full 90 day supply. Last fasting labs was 6 months ago. We will need a fasting lab on her to check her cholesterol levels

## 2017-09-09 ENCOUNTER — Other Ambulatory Visit: Payer: Self-pay | Admitting: Internal Medicine

## 2017-09-09 MED ORDER — PANTOPRAZOLE SODIUM 40 MG PO TBEC: DELAYED_RELEASE_TABLET | ORAL | 3 refills | Status: DC

## 2017-09-09 NOTE — Telephone Encounter (Signed)
Spoke to patient and informed her that Rx was sent to pharmacy.

## 2017-09-10 ENCOUNTER — Ambulatory Visit (INDEPENDENT_AMBULATORY_CARE_PROVIDER_SITE_OTHER): Payer: Medicare HMO | Admitting: Family Medicine

## 2017-09-10 ENCOUNTER — Encounter: Payer: Self-pay | Admitting: Family Medicine

## 2017-09-10 VITALS — BP 104/60 | HR 85 | Temp 97.9°F | Resp 16 | Ht 66.0 in | Wt 236.7 lb

## 2017-09-10 DIAGNOSIS — E782 Mixed hyperlipidemia: Secondary | ICD-10-CM | POA: Diagnosis not present

## 2017-09-10 DIAGNOSIS — F3341 Major depressive disorder, recurrent, in partial remission: Secondary | ICD-10-CM | POA: Diagnosis not present

## 2017-09-10 DIAGNOSIS — M79652 Pain in left thigh: Secondary | ICD-10-CM | POA: Diagnosis not present

## 2017-09-10 DIAGNOSIS — F410 Panic disorder [episodic paroxysmal anxiety] without agoraphobia: Secondary | ICD-10-CM | POA: Diagnosis not present

## 2017-09-10 DIAGNOSIS — I10 Essential (primary) hypertension: Secondary | ICD-10-CM | POA: Diagnosis not present

## 2017-09-10 DIAGNOSIS — R69 Illness, unspecified: Secondary | ICD-10-CM | POA: Diagnosis not present

## 2017-09-10 LAB — LIPID PANEL
Cholesterol: 105 mg/dL (ref <200)
HDL: 28 mg/dL — ABNORMAL LOW (ref >50)
LDL Cholesterol (Calc): 54 mg/dL (calc)
Non-HDL Cholesterol (Calc): 77 mg/dL (calc) (ref <130)
Total CHOL/HDL Ratio: 3.8 (calc) (ref <5.0)
Triglycerides: 150 mg/dL — ABNORMAL HIGH (ref <150)

## 2017-09-10 MED ORDER — ALPRAZOLAM 0.5 MG PO TABS: 0.5000 mg | ORAL_TABLET | Freq: Two times a day (BID) | ORAL | 2 refills | Status: DC | PRN

## 2017-09-10 MED ORDER — ESCITALOPRAM OXALATE 10 MG PO TABS: 10.0000 mg | ORAL_TABLET | Freq: Every day | ORAL | 0 refills | Status: DC

## 2017-09-10 MED ORDER — LOSARTAN POTASSIUM-HCTZ 100-12.5 MG PO TABS: 1.0000 | ORAL_TABLET | Freq: Every day | ORAL | 0 refills | Status: DC

## 2017-09-10 MED ORDER — ROSUVASTATIN CALCIUM 10 MG PO TABS: ORAL_TABLET | ORAL | 0 refills | Status: DC

## 2017-09-10 NOTE — Progress Notes (Signed)
Name: Robin Bryan   MRN: [id number]    DOB: [birth year]   Date:09/10/2017       Progress Note  Subjective  Chief Complaint  Chief Complaint  Patient presents with  . Medication Refill    4 month F/U  . Depression  . Hyperlipidemia  . Hypertension    Headaches    Depression         This is a chronic problem.  The onset quality is gradual. The problem is unchanged.  Associated symptoms include headaches.  Associated symptoms include no fatigue, no helplessness, no hopelessness, no appetite change, no myalgias and not sad.  Past treatments include SSRIs - Selective serotonin reuptake inhibitors. Hyperlipidemia  This is a recurrent problem. The problem is uncontrolled. Recent lipid tests were reviewed and are high (elevated Triglycerides.). Pertinent negatives include no chest pain, leg pain, myalgias or shortness of breath. Current antihyperlipidemic treatment includes statins.  Hypertension  This is a chronic problem. The problem is unchanged. The problem is controlled. Associated symptoms include blurred vision and headaches. Pertinent negatives include no chest pain, palpitations or shortness of breath. Past treatments include angiotensin blockers and diuretics. There is no history of kidney disease, CAD/MI or CVA (hx of TIA.).   Left Leg(Thigh) Pain: Pt. Has progressively worsening left medial leg and thigh pain, it is intermittent present for over 2 years, started after she had ovarian surgery in 2017, now gotten worse, she has difficulty walking with the pain. No swelling in the left leg.    Past Medical History:  Diagnosis Date  . Anxiety   . Arthritis   . Asthma   . Barrett's esophagus   . Bronchitis   . Depression   . Diverticulosis   . Esophageal stricture   . Fibromyalgia   . GERD (gastroesophageal reflux disease)   . Hx of adenomatous colonic polyps   . Hyperlipidemia   . Hypertension   . Intramural leiomyoma of uterus   . Myocardial infarction ([place])   . Stroke  [facility])     Past Surgical History:  Procedure Laterality Date  . ABDOMINAL HYSTERECTOMY    . INCONTINENCE SURGERY    . TUBAL LIGATION    . tumor excision ovaries      Family History  Problem Relation Age of Onset  . Ovarian cancer Sister   . Cancer Sister   . Diabetes Sister   . Heart disease Sister   . Lung cancer Sister   . Colon polyps Mother   . Diabetes Mother   . Parkinson's disease Cousin   . Cancer Father        lung  . Diabetes Daughter   . Colon cancer Neg Hx   . Esophageal cancer Neg Hx   . Pancreatic cancer Neg Hx   . Stomach cancer Neg Hx     Social History   Socioeconomic History  . Marital status: Married    Spouse name: Not on file  . Number of children: Not on file  . Years of education: Not on file  . Highest education level: Not on file  Social Needs  . Financial resource strain: Not on file  . Food insecurity - worry: Not on file  . Food insecurity - inability: Not on file  . Transportation needs - medical: Not on file  . Transportation needs - non-medical: Not on file  Occupational History  . Not on file  Tobacco Use  . Smoking status: Former Smoker    Packs/day: 0.50  Years: 8.00    Pack years: 4.00    Types: Cigarettes    Last attempt to quit: 05/27/2011    Years since quitting: 6.2  . Smokeless tobacco: Never Used  Substance and Sexual Activity  . Alcohol use: No  . Drug use: No  . Sexual activity: Not on file  Other Topics Concern  . Not on file  Social History Narrative  . Not on file     Current Outpatient Medications:  .  ALPRAZolam (XANAX) 0.5 MG tablet, Take 1 tablet (0.5 mg total) by mouth 2 (two) times daily as needed for anxiety., Disp: 60 tablet, Rfl: 2 .  clopidogrel (PLAVIX) 75 MG tablet, TAKE 1 TABLET(75 MG TOTAL) BY MOUTH DAILY., Disp: 90 tablet, Rfl: 3 .  clotrimazole-betamethasone (LOTRISONE) cream, Apply 1 application topically 2 (two) times daily., Disp: 60 g, Rfl: 2 .  diphenhydrAMINE (BENADRYL) 25 mg  capsule, Take 1 capsule (25 mg total) by mouth every 8 (eight) hours as needed., Disp: 30 capsule, Rfl: 0 .  doxycycline (VIBRAMYCIN) 100 MG capsule, , Disp: , Rfl:  .  EPINEPHrine 0.3 mg/0.3 mL IJ SOAJ injection, Inject 0.3 mLs (0.3 mg total) into the muscle once., Disp: 1 Device, Rfl: 0 .  escitalopram (LEXAPRO) 10 MG tablet, Take 1 tablet (10 mg total) by mouth daily., Disp: 90 tablet, Rfl: 0 .  gabapentin (NEURONTIN) 800 MG tablet, TK 1 T PO QID, Disp: , Rfl: 6 .  losartan-hydrochlorothiazide (HYZAAR) 100-12.5 MG tablet, TAKE 1 TABLET BY MOUTH DAILY, Disp: 90 tablet, Rfl: 0 .  MYRBETRIQ 50 MG TB24 tablet, TK 1 T PO QD, Disp: , Rfl: 3 .  oxyCODONE-acetaminophen (PERCOCET/ROXICET) 5-325 MG tablet, Take 1 tablet by mouth daily as needed for severe pain., Disp: , Rfl:  .  pantoprazole (PROTONIX) 40 MG tablet, TAKE 1 TABLET(40 MG) BY MOUTH TWICE DAILY, Disp: 180 tablet, Rfl: 3 .  primidone (MYSOLINE) 50 MG tablet, Take 1 tablet by mouth daily., Disp: , Rfl: 6 .  rosuvastatin (CRESTOR) 10 MG tablet, TAKE 1 TABLET(10 MG) BY MOUTH DAILY, Disp: 30 tablet, Rfl: 0 .  tiZANidine (ZANAFLEX) 4 MG tablet, Take 1 tablet by mouth daily., Disp: , Rfl: 5 .  ibuprofen (ADVIL,MOTRIN) 800 MG tablet, Take 1 tablet by mouth daily as needed., Disp: , Rfl: 1 .  solifenacin (VESICARE) 10 MG tablet, Take 10 mg by mouth daily., Disp: , Rfl:  .  tiZANidine (ZANAFLEX) 2 MG tablet, Take 1 tablet (2 mg total) by mouth every 8 (eight) hours as needed for muscle spasms. (Patient not taking: Reported on 05/12/2017), Disp: 30 tablet, Rfl: 0 .  venlafaxine XR (EFFEXOR-XR) 37.5 MG 24 hr capsule, Take 1 capsule (37.5 mg total) by mouth daily with breakfast., Disp: 5 capsule, Rfl: 0  Current Facility-Administered Medications:  .  0.9 %  sodium chloride infusion, 500 mL, Intravenous, Continuous, Irene Shipper, MD  Allergies  Allergen Reactions  . Shellfish Allergy Swelling  . Vicodin [Hydrocodone-Acetaminophen] Nausea Only      Review of Systems  Constitutional: Negative for appetite change and fatigue.  Eyes: Positive for blurred vision.  Respiratory: Negative for shortness of breath.   Cardiovascular: Negative for chest pain and palpitations.  Musculoskeletal: Negative for myalgias.  Neurological: Positive for headaches.  Psychiatric/Behavioral: Positive for depression.      Objective  Vitals:   09/10/17 1130  BP: 104/60  Pulse: 85  Resp: 16  Temp: 97.9 F (36.6 C)  TempSrc: Oral  SpO2: 95%  Weight: 236  lb 11.2 oz (107.4 kg)  Height: 5\' 6"  (1.676 m)    Physical Exam  Constitutional: She is oriented to person, place, and time and well-developed, well-nourished, and in no distress.  HENT:  Head: Normocephalic and atraumatic.  Cardiovascular: Normal rate, regular rhythm and normal heart sounds.  No murmur heard. Pulmonary/Chest: Effort normal and breath sounds normal. She has no wheezes.  Abdominal: Soft. Bowel sounds are normal.  Musculoskeletal:       Legs: Tenderness to palpation on the medial thigh from the groin area to the medial knee, no erythema, no cord visible,   Neurological: She is alert and oriented to person, place, and time.  Nursing note and vitals reviewed.      Assessment & Plan  1. Benign essential HTN BP stable on present antihypertensive treatment - losartan-hydrochlorothiazide (HYZAAR) 100-12.5 MG tablet; Take 1 tablet by mouth daily.  Dispense: 90 tablet; Refill: 0  2. Mixed hyperlipidemia FLP not at goal, continue on statin and adjust after repeating [place] today - rosuvastatin (CRESTOR) 10 MG tablet; TAKE 1 TABLET(10 MG) BY MOUTH DAILY  Dispense: 90 tablet; Refill: 0 - Lipid panel  3. Recurrent major depressive disorder, in partial remission ([place]) Major depression in remission, continue Lexapro - escitalopram (LEXAPRO) 10 MG tablet; Take 1 tablet (10 mg total) by mouth daily.  Dispense: 90 tablet; Refill: 0  4. Panic disorder without agoraphobia  -  ALPRAZolam (XANAX) 0.5 MG tablet; Take 1 tablet (0.5 mg total) by mouth 2 (two) times daily as needed for anxiety.  Dispense: 60 tablet; Refill: 2  5. Musculoskeletal pain of left thigh Advised to take Tylenol as needed for pain relief, she is already taking a muscle relaxant, and symptoms do not improve we'll consider MRI   Rozann Holts [facility] 09/10/2017 11:37 AM

## 2017-09-28 ENCOUNTER — Telehealth: Payer: Self-pay | Admitting: Family Medicine

## 2017-09-28 DIAGNOSIS — I10 Essential (primary) hypertension: Secondary | ICD-10-CM

## 2017-09-28 NOTE — Telephone Encounter (Signed)
She can be started on Micardis-HCTZ 80-12.5 mg daily

## 2017-09-28 NOTE — Telephone Encounter (Signed)
Copied from [place] [id number]. Topic: Inquiry >> Sep 28, 2017  1:43 PM Malena Catholic I, NT wrote: Reason for CRM: Pt Call and said the pharmacy called her and said the Hyzaar 100-12.5 Mg have a recall and she is in need of another medication sent in for her. Please advise. [facility]

## 2017-09-29 MED ORDER — TELMISARTAN-HCTZ 80-12.5 MG PO TABS: 1.0000 | ORAL_TABLET | Freq: Every day | ORAL | 0 refills | Status: DC

## 2017-09-29 NOTE — Telephone Encounter (Signed)
Spoke to the patient and she is agreed to the New Bp medication Micardis- HCTZ 80-12.5mg  take once daily. Sent to pt pharmacy.

## 2017-09-29 NOTE — Addendum Note (Signed)
Addended by: Bud Face N on: 09/29/2017 04:59 PM   Modules accepted: Orders

## 2017-09-29 NOTE — Telephone Encounter (Signed)
Left a message asking the patient if she will be ok starting Micardis 80-12.5 mg daily. Also asked pt if she ok with taking this Bp medication which pharmacy would she like for me to send it to.

## 2017-10-02 DIAGNOSIS — H2513 Age-related nuclear cataract, bilateral: Secondary | ICD-10-CM | POA: Diagnosis not present

## 2017-10-06 DIAGNOSIS — R69 Illness, unspecified: Secondary | ICD-10-CM | POA: Diagnosis not present

## 2017-10-14 DIAGNOSIS — M542 Cervicalgia: Secondary | ICD-10-CM | POA: Diagnosis not present

## 2017-10-14 DIAGNOSIS — Z8673 Personal history of transient ischemic attack (TIA), and cerebral infarction without residual deficits: Secondary | ICD-10-CM | POA: Diagnosis not present

## 2017-10-14 DIAGNOSIS — R51 Headache: Secondary | ICD-10-CM | POA: Diagnosis not present

## 2017-10-14 DIAGNOSIS — G3184 Mild cognitive impairment, so stated: Secondary | ICD-10-CM | POA: Diagnosis not present

## 2017-10-14 DIAGNOSIS — M5441 Lumbago with sciatica, right side: Secondary | ICD-10-CM | POA: Diagnosis not present

## 2017-10-14 DIAGNOSIS — G250 Essential tremor: Secondary | ICD-10-CM | POA: Diagnosis not present

## 2017-10-14 DIAGNOSIS — M5442 Lumbago with sciatica, left side: Secondary | ICD-10-CM | POA: Diagnosis not present

## 2017-10-27 ENCOUNTER — Encounter: Payer: Self-pay | Admitting: Family Medicine

## 2017-10-27 ENCOUNTER — Ambulatory Visit (INDEPENDENT_AMBULATORY_CARE_PROVIDER_SITE_OTHER): Payer: Medicare HMO | Admitting: Family Medicine

## 2017-10-27 ENCOUNTER — Other Ambulatory Visit: Payer: Self-pay

## 2017-10-27 VITALS — BP 110/72 | HR 81 | Temp 97.7°F | Wt 215.8 lb

## 2017-10-27 DIAGNOSIS — J069 Acute upper respiratory infection, unspecified: Secondary | ICD-10-CM | POA: Diagnosis not present

## 2017-10-27 DIAGNOSIS — F3341 Major depressive disorder, recurrent, in partial remission: Secondary | ICD-10-CM

## 2017-10-27 MED ORDER — LORATADINE 10 MG PO TABS: 10.0000 mg | ORAL_TABLET | Freq: Every day | ORAL | 0 refills | Status: DC

## 2017-10-27 MED ORDER — BENZONATATE 100 MG PO CAPS: 100.0000 mg | ORAL_CAPSULE | Freq: Three times a day (TID) | ORAL | 0 refills | Status: DC | PRN

## 2017-10-27 MED ORDER — FLUTICASONE PROPIONATE 50 MCG/ACT NA SUSP: 2.0000 | Freq: Every day | NASAL | 6 refills | Status: DC

## 2017-10-27 NOTE — Telephone Encounter (Signed)
Patient should not be out

## 2017-10-27 NOTE — Patient Instructions (Addendum)
Upper Respiratory Infection, Adult Most upper respiratory infections (URIs) are caused by a virus. A URI affects the nose, throat, and upper air passages. The most common type of URI is often called "the common cold." Follow these instructions at home:  Take medicines only as told by your doctor.  Gargle warm saltwater or take cough drops to comfort your throat as told by your doctor.  Use a warm mist humidifier or inhale steam from a shower to increase air moisture. This may make it easier to breathe.  Drink enough fluid to keep your pee (urine) clear or pale yellow.  Eat soups and other clear broths.  Have a healthy diet.  Rest as needed.  Go back to work when your fever is gone or your doctor says it is okay. ? You may need to stay home longer to avoid giving your URI to others. ? You can also wear a face mask and wash your hands often to prevent spread of the virus.  Use your inhaler more if you have asthma.  Do not use any tobacco products, including cigarettes, chewing tobacco, or electronic cigarettes. If you need help quitting, ask your doctor. Contact a doctor if:  You are getting worse, not better.  Your symptoms are not helped by medicine.  You have chills.  You are getting more short of breath.  You have brown or red mucus.  You have yellow or brown discharge from your nose.  You have pain in your face, especially when you bend forward.  You have a fever.  You have puffy (swollen) neck glands.  You have pain while swallowing.  You have white areas in the back of your throat. Get help right away if:  You have very bad or constant: ? Headache. ? Ear pain. ? Pain in your forehead, behind your eyes, and over your cheekbones (sinus pain). ? Chest pain.  You have long-lasting (chronic) lung disease and any of the following: ? Wheezing. ? Long-lasting cough. ? Coughing up blood. ? A change in your usual mucus.  You have a stiff neck.  You have  changes in your: ? Vision. ? Hearing. ? Thinking. ? Mood. This information is not intended to replace advice given to you by your health care provider. Make sure you discuss any questions you have with your health care provider. Document Released: 02/04/2008 Document Revised: 04/20/2016 Document Reviewed: 11/23/2013 Elsevier Interactive Patient Education  2018 Elsevier Inc[employer]  Cool Mist Vaporizer A cool mist vaporizer is a device that releases a cool mist into the air. If you have a cough or a cold, using a vaporizer may help relieve your symptoms. The mist adds moisture to the air, which may help thin your mucus and make it less sticky. When your mucus is thin and less sticky, it easier for you to breathe and to cough up secretions. Do not use a vaporizer if you are allergic to mold. Follow these instructions at home:  Follow the instructions that come with the vaporizer.  Do not use anything other than distilled water in the vaporizer.  Do not run the vaporizer all of the time. Doing that can cause mold or bacteria to grow in the vaporizer.  Clean the vaporizer after each time that you use it.  Clean and dry the vaporizer well before storing it.  Stop using the vaporizer if your breathing symptoms get worse. This information is not intended to replace advice given to you by your health care provider. Make sure   you discuss any questions you have with your health care provider. Document Released: 05/15/2004 Document Revised: 03/07/2016 Document Reviewed: 11/17/2015 Elsevier Interactive Patient Education  2018 Elsevier Inc.   

## 2017-10-27 NOTE — Telephone Encounter (Signed)
Pt was seen today by E. Uvaldo Rising, at this visit stated that she needs refill on lexapro as well as micardis, please advise. Thanks

## 2017-10-27 NOTE — Progress Notes (Signed)
Name: Robin Bryan   MRN: [id number]    DOB: 12-Mar-1951   Date:10/27/2017       Progress Note  Subjective  Chief Complaint  Chief Complaint  Patient presents with  . Sore Throat  . Ear Fullness    HPI  Pt presents with concern for sore throat, ear fullness, cough (non-productive) that started 3 days ago.  Endorses occasional headache, and hoarse voice.  Denies nasal congestion, chest pain, shortness of breath, NVD, abdominal pain, fevers/chills, rashes, or recent travel.  Recent sick contacts include her husband who was told he has a "super bug" - had cough and was put on azithromycin about a week ago.  Patient Active Problem List   Diagnosis Date Noted  . Facial rash 12/23/2016  . Headache upon awakening 09/29/2016  . Acute non-recurrent maxillary sinusitis 08/14/2016  . Flu-like symptoms 12/14/2015  . Acute left-sided thoracic back pain 10/30/2015  . Acute right hip pain 10/30/2015  . Fatigue 10/04/2015  . Temporary cerebral vascular dysfunction 07/16/2015  . Adaptation reaction 07/16/2015  . Cramp in muscle 07/16/2015  . Foot pain 07/16/2015  . HLD (hyperlipidemia) 07/16/2015  . Blood glucose elevated 07/16/2015  . BP (high blood pressure) 07/16/2015  . Chest pain 07/16/2015  . Bronchitis 07/16/2015  . Acid reflux 07/16/2015  . Abnormal LFTs 07/16/2015  . Pericardial effusion 07/13/2015  . Mass of chest wall, left 06/29/2015  . Lipoma 06/29/2015  . Pure hypercholesterolemia 04/28/2015  . Gastro-esophageal reflux disease without esophagitis 04/28/2015  . Benign essential HTN 04/28/2015  . Anxiety 04/28/2015  . Hyperlipidemia 03/19/2015  . Elevated hemoglobin A1c 03/19/2015  . Neck muscle spasm 03/19/2015  . Cognitive change 02/07/2015  . Persistent dry cough 02/07/2015  . Major depression in partial remission ([place]) 02/07/2015  . Fibrositis 02/07/2015  . Urgency of micturation 02/07/2015  . Anxiety disorder 02/07/2015  . History of anaphylaxis 02/07/2015  . Pubic  bone pain 10/02/2014  . Mass of pelvis 06/05/2014  . Ovarian cyst, left 05/26/2014  . Abdominal pain, chronic, left lower quadrant 05/26/2014  . DIARRHEA-PRESUMED INFECTIOUS 04/23/2010  . CEREBROVASCULAR DISEASE 04/23/2010  . COLONIC POLYPS, ADENOMATOUS 12/09/2006  . ESOPHAGEAL STRICTURE 12/09/2006  . GERD 12/09/2006  . BARRETTS ESOPHAGUS 12/09/2006  . DIVERTICULOSIS, COLON 12/09/2006    Social History   Tobacco Use  . Smoking status: Former Smoker    Packs/day: 0.50    Years: 8.00    Pack years: 4.00    Types: Cigarettes    Last attempt to quit: 05/27/2011    Years since quitting: 6.4  . Smokeless tobacco: Never Used  Substance Use Topics  . Alcohol use: No     Current Outpatient Medications:  .  ALPRAZolam (XANAX) 0.5 MG tablet, Take 1 tablet (0.5 mg total) by mouth 2 (two) times daily as needed for anxiety., Disp: 60 tablet, Rfl: 2 .  clopidogrel (PLAVIX) 75 MG tablet, TAKE 1 TABLET(75 MG TOTAL) BY MOUTH DAILY., Disp: 90 tablet, Rfl: 3 .  clotrimazole-betamethasone (LOTRISONE) cream, Apply 1 application topically 2 (two) times daily., Disp: 60 g, Rfl: 2 .  diphenhydrAMINE (BENADRYL) 25 mg capsule, Take 1 capsule (25 mg total) by mouth every 8 (eight) hours as needed., Disp: 30 capsule, Rfl: 0 .  doxycycline (VIBRAMYCIN) 100 MG capsule, , Disp: , Rfl:  .  EPINEPHrine 0.3 mg/0.3 mL IJ SOAJ injection, Inject 0.3 mLs (0.3 mg total) into the muscle once., Disp: 1 Device, Rfl: 0 .  escitalopram (LEXAPRO) 10 MG tablet, Take 1 tablet (  10 mg total) by mouth daily., Disp: 90 tablet, Rfl: 0 .  gabapentin (NEURONTIN) 400 MG capsule, , Disp: , Rfl:  .  ibuprofen (ADVIL,MOTRIN) 800 MG tablet, Take 1 tablet by mouth daily as needed., Disp: , Rfl: 1 .  MYRBETRIQ 50 MG TB24 tablet, TK 1 T PO QD, Disp: , Rfl: 3 .  oxyCODONE-acetaminophen (PERCOCET/ROXICET) 5-325 MG tablet, Take 1 tablet by mouth daily as needed for severe pain., Disp: , Rfl:  .  pantoprazole (PROTONIX) 40 MG tablet, TAKE 1  TABLET(40 MG) BY MOUTH TWICE DAILY, Disp: 180 tablet, Rfl: 3 .  primidone (MYSOLINE) 50 MG tablet, Take 1 tablet by mouth daily., Disp: , Rfl: 6 .  rosuvastatin (CRESTOR) 10 MG tablet, TAKE 1 TABLET(10 MG) BY MOUTH DAILY, Disp: 90 tablet, Rfl: 0 .  solifenacin (VESICARE) 10 MG tablet, Take 10 mg by mouth daily., Disp: , Rfl:  .  telmisartan-hydrochlorothiazide (MICARDIS HCT) 80-12.5 MG tablet, Take 1 tablet by mouth daily., Disp: 90 tablet, Rfl: 0 .  tiZANidine (ZANAFLEX) 2 MG tablet, Take 1 tablet (2 mg total) by mouth every 8 (eight) hours as needed for muscle spasms., Disp: 30 tablet, Rfl: 0 .  tiZANidine (ZANAFLEX) 4 MG tablet, Take 1 tablet by mouth daily., Disp: , Rfl: 5 .  venlafaxine XR (EFFEXOR-XR) 37.5 MG 24 hr capsule, Take 1 capsule (37.5 mg total) by mouth daily with breakfast., Disp: 5 capsule, Rfl: 0  Current Facility-Administered Medications:  .  0.9 %  sodium chloride infusion, 500 mL, Intravenous, Continuous, Irene Shipper, MD  Allergies  Allergen Reactions  . Shellfish Allergy Swelling  . Vicodin [Hydrocodone-Acetaminophen] Nausea Only    ROS  Constitutional: Negative for fever or weight change.  Respiratory: See HPI Cardiovascular: Negative for chest pain or palpitations.  Gastrointestinal: Negative for abdominal pain, no bowel changes.  Musculoskeletal: Negative for gait problem or joint swelling.  Skin: Negative for rash.  Neurological: Negative for dizziness. Positive for headaches  No other specific complaints in a complete review of systems (except as listed in HPI above).  Objective  Vitals:   10/27/17 1331  BP: 110/72  Pulse: 81  Temp: 97.7 F (36.5 C)  TempSrc: Oral  SpO2: 97%  Weight: 215 lb 12.8 oz (97.9 kg)    Body mass index is 34.83 kg/m.  Nursing Note and Vital Signs reviewed.  Physical Exam  Constitutional: Patient appears well-developed and well-nourished. Obese No distress.  HEENT: head atraumatic, normocephalic, pupils equal and  reactive to light, EOM's intact, TM's without erythema or bulging, no maxillary or frontal sinus tenderness , neck supple without lymphadenopathy, oropharynx mildly erythematous and moist without exudate Cardiovascular: Normal rate, regular rhythm, S1/S2 present.  No murmur or rub heard. No BLE edema. Pulmonary/Chest: Effort normal and breath sounds clear. No respiratory distress or retractions. Psychiatric: Patient has a normal mood and affect. behavior is normal. Judgment and thought content normal.  No results found for this or any previous visit (from the past 72 hour(s)).  Assessment & Plan  1. Upper respiratory tract infection, unspecified type - Reassurance regarding husband's illness and her lack of acute findings is discussed today, advised to RTC in 5-7 days if not improving, sooner if worsening.  Use good hand hygiene, drink plenty of water, and disinfect common household surfaces. - fluticasone (FLONASE) 50 MCG/ACT nasal spray; Place 2 sprays into both nostrils daily.  Dispense: 16 g; Refill: 6 - benzonatate (TESSALON) 100 MG capsule; Take 1-2 capsules (100-200 mg total) by mouth 3 (three)  times daily as needed for cough.  Dispense: 30 capsule; Refill: 0 - loratadine (CLARITIN) 10 MG tablet; Take 1 tablet (10 mg total) by mouth daily.  Dispense: 30 tablet; Refill: 0  -Red flags and when to present for emergency care or RTC including fever >101.4F, chest pain, shortness of breath, new/worsening/un-resolving symptoms, reviewed with patient at time of visit. Follow up and care instructions discussed and provided in AVS.

## 2017-10-30 ENCOUNTER — Ambulatory Visit: Payer: Self-pay | Admitting: *Deleted

## 2017-10-30 NOTE — Telephone Encounter (Signed)
Left message on vm home and cell

## 2017-10-30 NOTE — Telephone Encounter (Signed)
Please advise 

## 2017-10-30 NOTE — Telephone Encounter (Signed)
Patient notified to go to [facility]

## 2017-10-30 NOTE — Telephone Encounter (Signed)
I recommend urgent or emergency care per patient's discretion depending on severity of her symptoms.

## 2017-10-30 NOTE — Telephone Encounter (Signed)
Pt called because her daughter in law says that she is wheezing; the patient says that she has a barking cough that wakes her up at night and she took cough medication that she had at but but got no relief;  she states that she was seen on Tuesday 10/25/17 but she feels like she is getting worse and she feels like her throat is stopped up;  She also states that she has been exposed to a "super bug"nurse triage initiated and recommendations made per protocol to include seeing a physician within 4 hours; contacted Melissa at [facility] and she will route this to providers high priority because no appointments available and if if symptoms worsen before call back from office, pt should go to urgent care or ED; information relayed to pt and she verbalizes understanding; the pt also requests not to see Raelyn Ensign the pt can be contacted at [phone].  Reason for Disposition . Wheezing is present  Answer Assessment - Initial Assessment Questions 1. ONSET: "When did the cough begin?"      Sunday 10/25/17 2. SEVERITY: "How bad is the cough today?"      Like a bark 3. RESPIRATORY DISTRESS: "Describe your breathing."      ok 4. FEVER: "Do you have a fever?" If so, ask: "What is your temperature, how was it measured, and when did it start?"     No temp 97.0 at 0900 today 5. HEMOPTYSIS: "Are you coughing up any blood?" If so ask: "How much?" (flecks, streaks, tablespoons, etc.)     no 6. TREATMENT: "What have you done so far to treat the cough?" (e.g., meds, fluids, humidifier)     meds per visit on 10/27/17 (tessalon, claritin, flonase) 7. CARDIAC HISTORY: "Do you have any history of heart disease?" (e.g., heart attack, congestive heart failure)      Irregular heart beat 8. LUNG HISTORY: "Do you have any history of lung disease?"  (e.g., pulmonary embolus, asthma, emphysema)      Usually gets bronchitis every year; asthma as a child  47. PE RISK FACTORS: "Do you have a history of blood clots?" (or: recent  major surgery, recent prolonged travel, bedridden )     no 10. OTHER SYMPTOMS: "Do you have any other symptoms? (e.g., runny nose, wheezing, chest pain)       wheezing 11. PREGNANCY: "Is there any chance you are pregnant?" "When was your last menstrual period?"       no 12. TRAVEL: "Have you traveled out of the country in the last month?" (e.g., travel history, exposures)       no  Protocols used: COUGH - ACUTE NON-PRODUCTIVE-A-AH

## 2017-11-04 ENCOUNTER — Encounter: Payer: Self-pay | Admitting: Family Medicine

## 2017-11-04 ENCOUNTER — Ambulatory Visit (INDEPENDENT_AMBULATORY_CARE_PROVIDER_SITE_OTHER): Payer: Medicare HMO | Admitting: Family Medicine

## 2017-11-04 VITALS — BP 116/72 | HR 86 | Temp 98.1°F | Wt 217.1 lb

## 2017-11-04 DIAGNOSIS — R7309 Other abnormal glucose: Secondary | ICD-10-CM | POA: Diagnosis not present

## 2017-11-04 DIAGNOSIS — Z5181 Encounter for therapeutic drug level monitoring: Secondary | ICD-10-CM | POA: Diagnosis not present

## 2017-11-04 DIAGNOSIS — R69 Illness, unspecified: Secondary | ICD-10-CM | POA: Diagnosis not present

## 2017-11-04 DIAGNOSIS — F419 Anxiety disorder, unspecified: Secondary | ICD-10-CM

## 2017-11-04 DIAGNOSIS — E782 Mixed hyperlipidemia: Secondary | ICD-10-CM | POA: Diagnosis not present

## 2017-11-04 DIAGNOSIS — R252 Cramp and spasm: Secondary | ICD-10-CM | POA: Diagnosis not present

## 2017-11-04 MED ORDER — TELMISARTAN 80 MG PO TABS: 80.0000 mg | ORAL_TABLET | Freq: Every day | ORAL | 1 refills | Status: DC

## 2017-11-04 NOTE — Assessment & Plan Note (Signed)
Check A1c today; praise given for weight loss

## 2017-11-04 NOTE — Patient Instructions (Addendum)
Decrease your xanax by one-fourth of a pill every 7-10 days  12 Ways to Curb Anxiety  ?Anxiety is normal human sensation. It is what helped our ancestors survive the pitfalls of the wilderness. Anxiety is defined as experiencing worry or nervousness about an imminent event or something with an uncertain outcome. It is a feeling experienced by most people at some point in their lives. Anxiety can be triggered by a very personal issue, such as the illness of a loved one, or an event of global proportions, such as a refugee crisis. Some of the symptoms of anxiety are:  Feeling restless.  Having a feeling of impending danger.  Increased heart rate.  Rapid breathing. Sweating.  Shaking.  Weakness or feeling tired.  Difficulty concentrating on anything except the current worry.  Insomnia.  Stomach or bowel problems. What can we do about anxiety we may be feeling? There are many techniques to help manage stress and relax. Here are 12 ways you can reduce your anxiety almost immediately: 1. Turn off the constant feed of information. Take a social media sabbatical. Studies have shown that social media directly contributes to social anxiety.  2. Monitor your television viewing habits. Are you watching shows that are also contributing to your anxiety, such as 24-hour news stations? Try watching something else, or better yet, nothing at all. Instead, listen to music, read an inspirational book or practice a hobby. 3. Eat nutritious meals. Also, don't skip meals and keep healthful snacks on hand. Hunger and poor diet contributes to feeling anxious. 4. Sleep. Sleeping on a regular schedule for at least seven to eight hours a night will do wonders for your outlook when you are awake. 5. Exercise. Regular exercise will help rid your body of that anxious energy and help you get more restful sleep. 6. Try deep (diaphragmatic) breathing. Inhale slowly through your nose for five seconds and exhale through your  mouth. 7. Practice acceptance and gratitude. When anxiety hits, accept that there are things out of your control that shouldn't be of immediate concern.  8. Seek out humor. When anxiety strikes, watch a funny video, read jokes or call a friend who makes you laugh. Laughter is healing for our bodies and releases endorphins that are calming. 9. Stay positive. Take the effort to replace negative thoughts with positive ones. Try to see a stressful situation in a positive light. Try to come up with solutions rather than dwelling on the problem. 10. Figure out what triggers your anxiety. Keep a journal and make note of anxious moments and the events surrounding them. This will help you identify triggers you can avoid or even eliminate. 11. Talk to someone. Let a trusted friend, family member or even trained professional know that you are feeling overwhelmed and anxious. Verbalize what you are feeling and why.  12. Volunteer. If your anxiety is triggered by a crisis on a large scale, become an advocate and work to resolve the problem that is causing you unease. Anxiety is often unwelcome and can become overwhelming. If not kept in check, it can become a disorder that could require medical treatment. However, if you take the time to care for yourself and avoid the triggers that make you anxious, you will be able to find moments of relaxation and clarity that make your life much more enjoyable.  Let's get labs today If you have not heard anything from my staff in a week about any orders/referrals/studies from today, please contact us here to follow-up (  [phone]  

## 2017-11-04 NOTE — Progress Notes (Signed)
BP 116/72 (BP Location: Left Arm, Patient Position: Sitting, Cuff Size: Large)   Pulse 86   Temp 98.1 F (36.7 C) (Oral)   Wt 217 lb 1.6 oz (98.5 kg)   SpO2 97%   BMI 35.04 kg/m    Subjective:    Patient ID: Robin Bryan, female    DOB: Mar 27, 1951, 67 y.o.   MRN: [id number]  HPI: Robin Bryan is a 67 y.o. female  Chief Complaint  Patient presents with  . Muscle Pain    Pt states that she is suddenly having muscle and bone pain, strong muscle contrations     HPI Patient is here for an acute visit I don't know what's gotten into me She could see the muscles separate from the bone across the top of the left arm; has hgappened to right leg and foot in the past; curled up and couldn't get it to go down On HCTZ No palpitations; having chest pain, "you know, just... Felt different"; maybe anxiety She is not having chest pain right now; it was with the episode; "It wasn't a chest pain, just a weird feeling"; does have irregular heart beat at times Checking oxygen levels Taking foods from the root, cabbages; no salt, no pork, no white bread; lost 25 pounds in the last 6 weeks Taking xanax for anxiety; has been on that for years; since Dr. Jacqualine Code Takes it for help sleeping too; has been getting 60 pills a month, 2 refills in January If I get upset, it helps She has about 30 pills maybe left, maybe 40 day supply; she only takes once a day  HDL was only 28; weight gain over the last few years but now losing Vit D normal in August Prediabetes  Depression screen [facility] 2/9 09/10/2017 05/12/2017 04/03/2017 02/16/2017 01/01/2017  Decreased Interest 0 0 0 0 0  Down, Depressed, Hopeless 1 0 0 0 0  PHQ - 2 Score 1 0 0 0 0  Altered sleeping - - - - -  Tired, decreased energy - - - - -  Change in appetite - - - - -  Feeling bad or failure about yourself  - - - - -  Trouble concentrating - - - - -  Moving slowly or fidgety/restless - - - - -  Suicidal thoughts - - - - -  PHQ-9 Score - - - - -   Difficult doing work/chores - - - - -    Relevant past medical, surgical, family and social history reviewed Past Medical History:  Diagnosis Date  . Anxiety   . Arthritis   . Asthma   . Barrett's esophagus   . Bronchitis   . Depression   . Diverticulosis   . Esophageal stricture   . Fibromyalgia   . GERD (gastroesophageal reflux disease)   . Hx of adenomatous colonic polyps   . Hyperlipidemia   . Hypertension   . Intramural leiomyoma of uterus   . Myocardial infarction ([place])   . Stroke [facility])    Past Surgical History:  Procedure Laterality Date  . ABDOMINAL HYSTERECTOMY    . INCONTINENCE SURGERY    . TUBAL LIGATION    . tumor excision ovaries     Family History  Problem Relation Age of Onset  . Ovarian cancer Sister   . Cancer Sister   . Diabetes Sister   . Heart disease Sister   . Lung cancer Sister   . Colon polyps Mother   . Diabetes Mother   .  Parkinson's disease Cousin   . Cancer Father        lung  . Diabetes Daughter   . Colon cancer Neg Hx   . Esophageal cancer Neg Hx   . Pancreatic cancer Neg Hx   . Stomach cancer Neg Hx    Social History   Tobacco Use  . Smoking status: Former Smoker    Packs/day: 0.50    Years: 8.00    Pack years: 4.00    Types: Cigarettes    Last attempt to quit: 05/27/2011    Years since quitting: 6.4  . Smokeless tobacco: Never Used  Substance Use Topics  . Alcohol use: No  . Drug use: No    Interim medical history since last visit reviewed. Allergies and medications reviewed  Review of Systems Per HPI unless specifically indicated above     Objective:    BP 116/72 (BP Location: Left Arm, Patient Position: Sitting, Cuff Size: Large)   Pulse 86   Temp 98.1 F (36.7 C) (Oral)   Wt 217 lb 1.6 oz (98.5 kg)   SpO2 97%   BMI 35.04 kg/m   Wt Readings from Last 3 Encounters:  11/04/17 217 lb 1.6 oz (98.5 kg)  10/27/17 215 lb 12.8 oz (97.9 kg)  09/10/17 236 lb 11.2 oz (107.4 kg)    Physical Exam    Constitutional: She appears well-developed and well-nourished. No distress.  HENT:  Head: Normocephalic and atraumatic.  Eyes: EOM are normal. No scleral icterus.  Neck: No thyromegaly present.  Cardiovascular: Normal rate, regular rhythm and normal heart sounds.  No murmur heard. Pulmonary/Chest: Effort normal and breath sounds normal. No respiratory distress. She has no wheezes.  Abdominal: Soft. Bowel sounds are normal. She exhibits no distension.  Musculoskeletal: Normal range of motion. She exhibits no edema.  Neurological: She is alert. She exhibits normal muscle tone.  Skin: Skin is warm and dry. She is not diaphoretic. No pallor.  Psychiatric: She has a normal mood and affect. Her behavior is normal. Judgment and thought content normal.       Assessment & Plan:   Problem List Items Addressed This Visit      Other   Hyperlipidemia    Expect that to improve and will check labs in 3-6 months      Relevant Medications   telmisartan (MICARDIS) 80 MG tablet   Elevated hemoglobin A1c (Chronic)    Check A1c today; praise given for weight loss      Relevant Orders   Hemoglobin A1c (Completed)    Other Visit Diagnoses    Muscle cramps    -  Primary   Relevant Orders   TSH (Completed)   Magnesium (Completed)   Medication monitoring encounter       Relevant Orders   COMPLETE METABOLIC PANEL WITH GFR (Completed)       Follow up plan: Return in about 3 weeks (around 11/25/2017) for follow-up visit with Dr. Sanda Klein.  An after-visit summary was printed and given to the patient at [place].  Please see the patient instructions which may contain other information and recommendations beyond what is mentioned above in the assessment and plan.  Meds ordered this encounter  Medications  . telmisartan (MICARDIS) 80 MG tablet    Sig: Take 1 tablet (80 mg total) by mouth daily.    Dispense:  90 tablet    Refill:  1    STOP the hctz component; cancel old refills of the other  telmisartan-hct  Orders Placed This Encounter  Procedures  . COMPLETE METABOLIC PANEL WITH GFR  . Hemoglobin A1c  . TSH  . Magnesium

## 2017-11-04 NOTE — Assessment & Plan Note (Signed)
Expect that to improve and will check labs in 3-6 months

## 2017-11-05 ENCOUNTER — Other Ambulatory Visit: Payer: Self-pay | Admitting: Family Medicine

## 2017-11-05 LAB — COMPLETE METABOLIC PANEL WITH GFR
AG RATIO: 2 (calc) (ref 1.0–2.5)
ALBUMIN MSPROF: 4.3 g/dL (ref 3.6–5.1)
ALT: 21 U/L (ref 6–29)
AST: 14 U/L (ref 10–35)
Alkaline phosphatase (APISO): 81 U/L (ref 33–130)
BUN / CREAT RATIO: 17 (calc) (ref 6–22)
BUN: 17 mg/dL (ref 7–25)
CALCIUM: 9.4 mg/dL (ref 8.6–10.4)
CO2: 27 mmol/L (ref 20–32)
CREATININE: 1.01 mg/dL — AB (ref 0.50–0.99)
Chloride: 102 mmol/L (ref 98–110)
GFR, EST NON AFRICAN AMERICAN: 58 mL/min/{1.73_m2} — AB (ref >=60)
GFR, Est African American: 68 mL/min/{1.73_m2} (ref >=60)
GLOBULIN: 2.2 g/dL (ref 1.9–3.7)
Glucose, Bld: 98 mg/dL (ref 65–139)
POTASSIUM: 4.5 mmol/L (ref 3.5–5.3)
Sodium: 136 mmol/L (ref 135–146)
Total Bilirubin: 0.6 mg/dL (ref 0.2–1.2)
Total Protein: 6.5 g/dL (ref 6.1–8.1)

## 2017-11-05 LAB — HEMOGLOBIN A1C
Hgb A1c MFr Bld: 5.5 % of total Hgb (ref <5.7)
Mean Plasma Glucose: 111 (calc)
eAG (mmol/L): 6.2 (calc)

## 2017-11-05 LAB — MAGNESIUM: MAGNESIUM: 2 mg/dL (ref 1.5–2.5)

## 2017-11-05 LAB — TSH: TSH: 0.85 mIU/L (ref 0.40–4.50)

## 2017-11-05 MED ORDER — CLOPIDOGREL BISULFATE 75 MG PO TABS
75.0000 mg | ORAL_TABLET | Freq: Every day | ORAL | 3 refills | Status: DC
Start: 2017-11-05 — End: 2018-10-13

## 2017-11-09 DIAGNOSIS — R0602 Shortness of breath: Secondary | ICD-10-CM | POA: Diagnosis not present

## 2017-11-09 DIAGNOSIS — I10 Essential (primary) hypertension: Secondary | ICD-10-CM | POA: Diagnosis not present

## 2017-11-09 DIAGNOSIS — I313 Pericardial effusion (noninflammatory): Secondary | ICD-10-CM | POA: Diagnosis not present

## 2017-11-09 DIAGNOSIS — E7800 Pure hypercholesterolemia, unspecified: Secondary | ICD-10-CM | POA: Diagnosis not present

## 2017-11-13 NOTE — Assessment & Plan Note (Signed)
I will not be continuing prescriptions of Xanax for any of Dr. Trena Platt patients; patient aware

## 2017-11-26 ENCOUNTER — Ambulatory Visit
Admission: RE | Admit: 2017-11-26 | Discharge: 2017-11-26 | Disposition: A | Discharge status: Home / Self Care | Payer: Medicare HMO | Source: Ambulatory Visit | Attending: Family Medicine | Admitting: Family Medicine

## 2017-11-26 ENCOUNTER — Other Ambulatory Visit: Payer: Self-pay

## 2017-11-26 ENCOUNTER — Ambulatory Visit (INDEPENDENT_AMBULATORY_CARE_PROVIDER_SITE_OTHER): Payer: Medicare HMO | Admitting: Family Medicine

## 2017-11-26 ENCOUNTER — Encounter: Payer: Self-pay | Admitting: Family Medicine

## 2017-11-26 VITALS — BP 120/74 | HR 68 | Temp 97.7°F | Ht 66.0 in | Wt 218.0 lb

## 2017-11-26 DIAGNOSIS — M24871 Other specific joint derangements of right ankle, not elsewhere classified: Secondary | ICD-10-CM | POA: Insufficient documentation

## 2017-11-26 DIAGNOSIS — M24872 Other specific joint derangements of left ankle, not elsewhere classified: Secondary | ICD-10-CM | POA: Insufficient documentation

## 2017-11-26 DIAGNOSIS — R69 Illness, unspecified: Secondary | ICD-10-CM | POA: Diagnosis not present

## 2017-11-26 DIAGNOSIS — M19072 Primary osteoarthritis, left ankle and foot: Secondary | ICD-10-CM | POA: Diagnosis not present

## 2017-11-26 DIAGNOSIS — R195 Other fecal abnormalities: Secondary | ICD-10-CM

## 2017-11-26 DIAGNOSIS — F410 Panic disorder [episodic paroxysmal anxiety] without agoraphobia: Secondary | ICD-10-CM | POA: Diagnosis not present

## 2017-11-26 DIAGNOSIS — M546 Pain in thoracic spine: Secondary | ICD-10-CM

## 2017-11-26 DIAGNOSIS — M25571 Pain in right ankle and joints of right foot: Secondary | ICD-10-CM | POA: Diagnosis not present

## 2017-11-26 DIAGNOSIS — G8929 Other chronic pain: Secondary | ICD-10-CM

## 2017-11-26 DIAGNOSIS — M25572 Pain in left ankle and joints of left foot: Secondary | ICD-10-CM

## 2017-11-26 DIAGNOSIS — M7731 Calcaneal spur, right foot: Secondary | ICD-10-CM | POA: Insufficient documentation

## 2017-11-26 DIAGNOSIS — N183 Chronic kidney disease, stage 3 unspecified: Secondary | ICD-10-CM | POA: Insufficient documentation

## 2017-11-26 DIAGNOSIS — M19071 Primary osteoarthritis, right ankle and foot: Secondary | ICD-10-CM

## 2017-11-26 DIAGNOSIS — R1011 Right upper quadrant pain: Secondary | ICD-10-CM

## 2017-11-26 DIAGNOSIS — M549 Dorsalgia, unspecified: Secondary | ICD-10-CM | POA: Insufficient documentation

## 2017-11-26 DIAGNOSIS — M7732 Calcaneal spur, left foot: Secondary | ICD-10-CM

## 2017-11-26 HISTORY — DX: Calcaneal spur, right foot: M77.31

## 2017-11-26 HISTORY — DX: Primary osteoarthritis, right ankle and foot: M19.071

## 2017-11-26 MED ORDER — HYDROCODONE-ACETAMINOPHEN 5-325 MG PO TABS: 1.0000 | ORAL_TABLET | Freq: Four times a day (QID) | ORAL | Status: DC | PRN

## 2017-11-26 NOTE — Progress Notes (Signed)
BP 120/74 (BP Location: Left Arm, Patient Position: Sitting, Cuff Size: Large)   Pulse 68   Temp 97.7 F (36.5 C) (Oral)   Ht 5\' 6"  (1.676 m)   Wt 218 lb (98.9 kg)   SpO2 98%   BMI 35.19 kg/m    Subjective:    Patient ID: Robin Bryan, female    DOB: 1951-08-27, 67 y.o.   MRN: [id number]  HPI: Robin Bryan is a 67 y.o. female  Chief Complaint  Patient presents with  . Back Pain    Pt states thats she is having sharp stabbing pain in the middle of the back, sometimes in the stoamch as well     HPI Here for f/u  Having back pain, across the middle below bra line; going on for a week; no blood in the urine; no burning with urination; no rash like shingles; nothing out of the ordinary with activity; she tried quinine in tonic; I asked what else; she goes back to her hydrocodone from neurologist; just started a week ago; she gets her left leg to give out on her every once in a while; no shooting pains; left leg hasn't given in a long time; neurologist is aware of that  She also has bilateral ankle pain; worse in the mornings; when she is laying in bed, it's just like someone takes iron and squeezes it, "like bone to the bone"; can't walk like that; feet not cold or purple; no old ankle injuries  Last Cr was 0.71 and most recent 1.01; good water drinker; she wonders if it was the HCTZ; no NSAIDs; no fam hx of kidney disease  She has RUQ pain, "just a pain right though here" pointing to RUQ; off and on; not every day; "when it hits it hits"; sharp pain; does not happen after she has eating something; can hurt while getting dressed; stools are dark but she eats a lot of greens and dark stuff; colonoscopy this past year she says and everything was good; thinks it is just the diet; she is not sure if tied together; no fevers; no nausea; appetite is good; no unexplained weight loss, no night sweats; last LFTs normal on 11/04/17; pain just in the last two weeks  She was on xanax per Dr. Manuella Ghazi,  Dr. Jacqualine Code before that; she is cutting them in half; she is not sleeping hardly at all  Depression screen [facility] 2/9 11/26/2017 09/10/2017 05/12/2017 04/03/2017 02/16/2017  Decreased Interest 0 0 0 0 0  Down, Depressed, Hopeless 0 1 0 0 0  PHQ - 2 Score 0 1 0 0 0  Altered sleeping - - - - -  Tired, decreased energy - - - - -  Change in appetite - - - - -  Feeling bad or failure about yourself  - - - - -  Trouble concentrating - - - - -  Moving slowly or fidgety/restless - - - - -  Suicidal thoughts - - - - -  PHQ-9 Score - - - - -  Difficult doing work/chores - - - - -    Relevant past medical, surgical, family and social history reviewed Past Medical History:  Diagnosis Date  . Anxiety   . Arthritis   . Asthma   . Barrett's esophagus   . Bronchitis   . Depression   . Diverticulosis   . Esophageal stricture   . Fibromyalgia   . GERD (gastroesophageal reflux disease)   . Hx of adenomatous colonic  polyps   . Hyperlipidemia   . Hypertension   . Intramural leiomyoma of uterus   . Myocardial infarction ([place])   . Stroke [facility])    Past Surgical History:  Procedure Laterality Date  . ABDOMINAL HYSTERECTOMY    . INCONTINENCE SURGERY    . TUBAL LIGATION    . tumor excision ovaries     Family History  Problem Relation Age of Onset  . Ovarian cancer Sister   . Cancer Sister   . Diabetes Sister   . Heart disease Sister   . Lung cancer Sister   . Colon polyps Mother   . Diabetes Mother   . Parkinson's disease Cousin   . Cancer Father        lung  . Diabetes Daughter   . Colon cancer Neg Hx   . Esophageal cancer Neg Hx   . Pancreatic cancer Neg Hx   . Stomach cancer Neg Hx    Social History   Tobacco Use  . Smoking status: Former Smoker    Packs/day: 0.50    Years: 8.00    Pack years: 4.00    Types: Cigarettes    Last attempt to quit: 05/27/2011    Years since quitting: 6.5  . Smokeless tobacco: Never Used  Substance Use Topics  . Alcohol use: No  . Drug use: No    Interim medical history since last visit reviewed. Allergies and medications reviewed  Review of Systems Per HPI unless specifically indicated above     Objective:    BP 120/74 (BP Location: Left Arm, Patient Position: Sitting, Cuff Size: Large)   Pulse 68   Temp 97.7 F (36.5 C) (Oral)   Ht 5\' 6"  (1.676 m)   Wt 218 lb (98.9 kg)   SpO2 98%   BMI 35.19 kg/m   Wt Readings from Last 3 Encounters:  11/26/17 218 lb (98.9 kg)  11/04/17 217 lb 1.6 oz (98.5 kg)  10/27/17 215 lb 12.8 oz (97.9 kg)    Physical Exam  Constitutional: She appears well-developed and well-nourished. No distress.  obese  HENT:  Head: Normocephalic and atraumatic.  Eyes: EOM are normal. No scleral icterus.  Neck: No thyromegaly present.  Cardiovascular: Normal rate, regular rhythm and normal heart sounds.  No murmur heard. Pulses:      Dorsalis pedis pulses are 2+ on the right side, and 2+ on the left side.  Feet are warm and appear well-perfused; no mottling; not cool to the touch  Pulmonary/Chest: Effort normal and breath sounds normal. No respiratory distress. She has no wheezes.  Abdominal: Soft. Normal appearance and bowel sounds are normal. She exhibits no distension. There is no hepatosplenomegaly. There is tenderness in the right upper quadrant. There is negative Murphy's sign.  Musculoskeletal: Normal range of motion. She exhibits no edema.       Right ankle: She exhibits normal range of motion, no swelling, no deformity and normal pulse.       Left ankle: She exhibits normal range of motion, no swelling, no deformity and normal pulse.       Back:  Neurological: She is alert. She exhibits normal muscle tone.  No LE weakness; monofilament sensation intact both feet  Skin: Skin is warm and dry. No rash (specifically, no rash over the back to suggest shingles) noted. She is not diaphoretic. No pallor.  Psychiatric: She has a normal mood and affect. Her behavior is normal. Judgment and thought  content normal.    Results for orders  placed or performed in visit on 11/04/17  COMPLETE METABOLIC PANEL WITH GFR  Result Value Ref Range   Glucose, Bld 98 65 - 139 mg/dL   BUN 17 7 - 25 mg/dL   Creat 1.01 (H) 0.50 - 0.99 mg/dL   GFR, Est Non African American 58 (L) > OR = 60 mL/min/1.39m2   GFR, Est African American 68 > OR = 60 mL/min/1.22m2   BUN/Creatinine Ratio 17 6 - 22 (calc)   Sodium 136 135 - 146 mmol/L   Potassium 4.5 3.5 - 5.3 mmol/L   Chloride 102 98 - 110 mmol/L   CO2 27 20 - 32 mmol/L   Calcium 9.4 8.6 - 10.4 mg/dL   Total Protein 6.5 6.1 - 8.1 g/dL   Albumin 4.3 3.6 - 5.1 g/dL   Globulin 2.2 1.9 - 3.7 g/dL (calc)   AG Ratio 2.0 1.0 - 2.5 (calc)   Total Bilirubin 0.6 0.2 - 1.2 mg/dL   Alkaline phosphatase (APISO) 81 33 - 130 U/L   AST 14 10 - 35 U/L   ALT 21 6 - 29 U/L  Hemoglobin A1c  Result Value Ref Range   Hgb A1c MFr Bld 5.5 <5.7 % of total Hgb   Mean Plasma Glucose 111 (calc)   eAG (mmol/L) 6.2 (calc)  TSH  Result Value Ref Range   TSH 0.85 0.40 - 4.50 mIU/L  Magnesium  Result Value Ref Range   Magnesium 2.0 1.5 - 2.5 mg/dL      Assessment & Plan:   Problem List Items Addressed This Visit      Genitourinary   Chronic kidney disease, stage III (moderate) (HCC)    Explained to patient about the drop in her GFR, the rise in her Cr; avoid NSAIDs; hydrate; check today        Other   Back pain   Relevant Medications   HYDROcodone-acetaminophen (NORCO/VICODIN) 5-325 MG tablet   Other Relevant Orders   Urinalysis w microscopic + reflex cultur   Anxiety disorder    Patient is tapering her benzos; reported taking some prior hydrocodone; CAUTIONED her about risk of combining benzo and opiate, dangerous and she says she doesn't combine them       Other Visit Diagnoses    RUQ pain    -  Primary   ddx includes cholelithiasis, liver process; will start with labs and [place]   Relevant Orders   US Abdomen Complete   COMPLETE METABOLIC PANEL WITH GFR    Dark stools       patient reports colonoscopy within the last year; stool cards x 3 given, instructed to return as soon as possible; staff to use code [phone] when returned   Relevant Orders   CBC with Differential/Platelet   Chronic pain of both ankles       does not appear to be gout, PVD; will start with xrays to see if arthritis; tylenol, turmeric   Relevant Orders   DG Ankle Complete Left   DG Ankle Complete Right       Follow up plan: Return in about 2 weeks (around 12/10/2017) for follow-up visit with Dr. Sanda Klein.  An after-visit summary was printed and given to the patient at [place].  Please see the patient instructions which may contain other information and recommendations beyond what is mentioned above in the assessment and plan.  Meds ordered this encounter  Medications  . HYDROcodone-acetaminophen (NORCO/VICODIN) 5-325 MG tablet    Sig: Take 1 tablet by mouth every 6 (six)  hours as needed for moderate pain.    Orders Placed This Encounter  Procedures  . US Abdomen Complete  . DG Ankle Complete Left  . DG Ankle Complete Right  . Urinalysis w microscopic + reflex cultur  . COMPLETE METABOLIC PANEL WITH GFR  . CBC with Differential/Platelet

## 2017-11-26 NOTE — Assessment & Plan Note (Signed)
Explained to patient about the drop in her GFR, the rise in her Cr; avoid NSAIDs; hydrate; check today

## 2017-11-26 NOTE — Patient Instructions (Signed)
Please have the xrays done across the street If you need something for aches or pains, try to use Tylenol (acetaminophen) instead of non-steroidals (which include Aleve, ibuprofen, Advil, Motrin, and naproxen); non-steroidals can cause long-term kidney damage Take tylenol per the package directions Try turmeric as a natural anti-inflammatory (for pain and arthritis). It comes in capsules where you buy aspirin and fish oil, but also as a spice where you buy pepper and garlic powder. We'll get the ultrasound

## 2017-11-26 NOTE — Assessment & Plan Note (Signed)
Patient is tapering her benzos; reported taking some prior hydrocodone; CAUTIONED her about risk of combining benzo and opiate, dangerous and she says she doesn't combine them

## 2017-11-28 LAB — COMPLETE METABOLIC PANEL WITH GFR
AG Ratio: 2.6 (calc) — ABNORMAL HIGH (ref 1.0–2.5)
ALT: 23 U/L (ref 6–29)
AST: 17 U/L (ref 10–35)
Albumin: 4.5 g/dL (ref 3.6–5.1)
Alkaline phosphatase (APISO): 74 U/L (ref 33–130)
BILIRUBIN TOTAL: 0.4 mg/dL (ref 0.2–1.2)
BUN: 13 mg/dL (ref 7–25)
CALCIUM: 9.2 mg/dL (ref 8.6–10.4)
CHLORIDE: 105 mmol/L (ref 98–110)
CO2: 28 mmol/L (ref 20–32)
Creat: 0.72 mg/dL (ref 0.50–0.99)
GFR, EST AFRICAN AMERICAN: 102 mL/min/{1.73_m2} (ref >=60)
GFR, Est Non African American: 88 mL/min/{1.73_m2} (ref >=60)
Globulin: 1.7 g/dL (calc) — ABNORMAL LOW (ref 1.9–3.7)
Glucose, Bld: 93 mg/dL (ref 65–139)
Potassium: 4.3 mmol/L (ref 3.5–5.3)
Sodium: 140 mmol/L (ref 135–146)
TOTAL PROTEIN: 6.2 g/dL (ref 6.1–8.1)

## 2017-11-28 LAB — CBC WITH DIFFERENTIAL/PLATELET
BASOS PCT: 0.8 %
Basophils Absolute: 53 cells/uL (ref 0–200)
Eosinophils Absolute: 158 cells/uL (ref 15–500)
Eosinophils Relative: 2.4 %
HCT: 38.9 % (ref 35.0–45.0)
Hemoglobin: 13.4 g/dL (ref 11.7–15.5)
Lymphs Abs: 1881 cells/uL (ref 850–3900)
MCH: 30.9 pg (ref 27.0–33.0)
MCHC: 34.4 g/dL (ref 32.0–36.0)
MCV: 89.8 fL (ref 80.0–100.0)
MPV: 9.2 fL (ref 7.5–12.5)
Monocytes Relative: 6.6 %
NEUTROS PCT: 61.7 %
Neutro Abs: 4072 cells/uL (ref 1500–7800)
PLATELETS: 337 10*3/uL (ref 140–400)
RBC: 4.33 10*6/uL (ref 3.80–5.10)
RDW: 12.8 % (ref 11.0–15.0)
TOTAL LYMPHOCYTE: 28.5 %
WBC mixed population: 436 cells/uL (ref 200–950)
WBC: 6.6 10*3/uL (ref 3.8–10.8)

## 2017-11-28 LAB — URINALYSIS W MICROSCOPIC + REFLEX CULTURE
Bacteria, UA: NONE SEEN /HPF
Bilirubin Urine: NEGATIVE
Glucose, UA: NEGATIVE
HYALINE CAST: NONE SEEN /LPF
Hgb urine dipstick: NEGATIVE
Ketones, ur: NEGATIVE
Nitrites, Initial: NEGATIVE
PROTEIN: NEGATIVE
RBC / HPF: NONE SEEN /HPF (ref 0–2)
Specific Gravity, Urine: 1.02 (ref 1.001–1.03)
pH: 7 (ref 5.0–8.0)

## 2017-11-28 LAB — URINE CULTURE
MICRO NUMBER: 90393455
SPECIMEN QUALITY:: ADEQUATE

## 2017-11-28 LAB — CULTURE INDICATED

## 2017-11-29 ENCOUNTER — Other Ambulatory Visit: Payer: Self-pay | Admitting: Family Medicine

## 2017-11-29 DIAGNOSIS — J069 Acute upper respiratory infection, unspecified: Secondary | ICD-10-CM

## 2017-12-03 ENCOUNTER — Ambulatory Visit (HOSPITAL_COMMUNITY): Payer: Medicare Other

## 2017-12-04 ENCOUNTER — Ambulatory Visit (HOSPITAL_COMMUNITY)
Admission: RE | Admit: 2017-12-04 | Discharge: 2017-12-04 | Disposition: A | Discharge status: Home / Self Care | Payer: Medicare HMO | Source: Ambulatory Visit | Attending: Family Medicine | Admitting: Family Medicine

## 2017-12-04 DIAGNOSIS — R1011 Right upper quadrant pain: Secondary | ICD-10-CM | POA: Diagnosis not present

## 2017-12-04 DIAGNOSIS — K7689 Other specified diseases of liver: Secondary | ICD-10-CM | POA: Diagnosis not present

## 2017-12-08 ENCOUNTER — Other Ambulatory Visit: Payer: Self-pay

## 2017-12-08 DIAGNOSIS — F3341 Major depressive disorder, recurrent, in partial remission: Secondary | ICD-10-CM

## 2017-12-08 MED ORDER — ESCITALOPRAM OXALATE 10 MG PO TABS: 10.0000 mg | ORAL_TABLET | Freq: Every day | ORAL | 1 refills | Status: DC

## 2017-12-09 ENCOUNTER — Encounter: Payer: Self-pay | Admitting: Family Medicine

## 2017-12-09 ENCOUNTER — Ambulatory Visit (INDEPENDENT_AMBULATORY_CARE_PROVIDER_SITE_OTHER): Payer: Medicare HMO | Admitting: Family Medicine

## 2017-12-09 VITALS — BP 126/64 | HR 67 | Temp 98.1°F | Resp 14 | Ht 66.0 in | Wt 217.6 lb

## 2017-12-09 DIAGNOSIS — J069 Acute upper respiratory infection, unspecified: Secondary | ICD-10-CM | POA: Diagnosis not present

## 2017-12-09 DIAGNOSIS — K769 Liver disease, unspecified: Secondary | ICD-10-CM

## 2017-12-09 DIAGNOSIS — E782 Mixed hyperlipidemia: Secondary | ICD-10-CM | POA: Diagnosis not present

## 2017-12-09 DIAGNOSIS — K219 Gastro-esophageal reflux disease without esophagitis: Secondary | ICD-10-CM

## 2017-12-09 DIAGNOSIS — K222 Esophageal obstruction: Secondary | ICD-10-CM | POA: Diagnosis not present

## 2017-12-09 DIAGNOSIS — F3341 Major depressive disorder, recurrent, in partial remission: Secondary | ICD-10-CM | POA: Diagnosis not present

## 2017-12-09 DIAGNOSIS — Z13820 Encounter for screening for osteoporosis: Secondary | ICD-10-CM | POA: Diagnosis not present

## 2017-12-09 DIAGNOSIS — R69 Illness, unspecified: Secondary | ICD-10-CM | POA: Diagnosis not present

## 2017-12-09 DIAGNOSIS — E669 Obesity, unspecified: Secondary | ICD-10-CM | POA: Diagnosis not present

## 2017-12-09 MED ORDER — LORATADINE 10 MG PO TABS: 10.0000 mg | ORAL_TABLET | Freq: Every day | ORAL | 11 refills | Status: DC

## 2017-12-09 MED ORDER — ROSUVASTATIN CALCIUM 10 MG PO TABS: 10.0000 mg | ORAL_TABLET | Freq: Every day | ORAL | 1 refills | Status: DC

## 2017-12-09 MED ORDER — ESCITALOPRAM OXALATE 10 MG PO TABS: 10.0000 mg | ORAL_TABLET | Freq: Every day | ORAL | 1 refills | Status: DC

## 2017-12-09 NOTE — Progress Notes (Signed)
BP 126/64   Pulse 67   Temp 98.1 F (36.7 C) (Oral)   Resp 14   Ht 5\' 6"  (1.676 m)   Wt 217 lb 9.6 oz (98.7 kg)   SpO2 96%   BMI 35.12 kg/m    Subjective:    Patient ID: Robin Bryan, female    DOB: 1951-05-14, 67 y.o.   MRN: [id number]  HPI: Robin Bryan is a 67 y.o. female  Chief Complaint  Patient presents with  . Follow-up  . Medication Refill  . Depression    lexapro to exspensive    HPI  Patient is here for follow-up for results on abdominal US: [place] completed on 12/04/2017 with the following impression:  No acute process or explanation for right upper quadrant pain.  Hyperechoic right liver lobe lesion likely corresponds to the hemangioma on 10/22/2006 MRI. Recommend ultrasound follow-up at 3 months to confirm size stability.  Discussed full results with patient.   Patient RUQ pain resolved- about two weeks ago but is still having pain in left side and back. Patient takes hydrocodone and it resolve pain for a few days. States when she is bending or twisting it makes the pain worse. Heat improves symptoms. Pain is described as an achey pain.   Lexapro was too expensive- has been on it for two months and it works well for her but it is too expensive for refills  Patient follows up with Dr. Henrene Pastor- GI for GERD and esophageal strictures- states gets stretched every endoscopy about a year ago. She takes pantoprazole for the past 5 months per his recommendation.   Patient has high cholesterol; on statin; no muscle aches; HDL is too low; level was 28, down from 32 before, as high as 37 one year ago; does eat white cheese, one ounce string a day; occasional eggs, maybe 2 a week; drinks almond milk  Obesity; went from 250 pounds down to what she is now; working hard; hit a plateau and will get more activity  Depression screen [facility] 2/9 12/09/2017 11/26/2017 09/10/2017 05/12/2017 04/03/2017  Decreased Interest 0 0 0 0 0  Down, Depressed, Hopeless 0 0 1 0 0  PHQ - 2 Score 0 0 1  0 0  Altered sleeping - - - - -  Tired, decreased energy - - - - -  Change in appetite - - - - -  Feeling bad or failure about yourself  - - - - -  Trouble concentrating - - - - -  Moving slowly or fidgety/restless - - - - -  Suicidal thoughts - - - - -  PHQ-9 Score - - - - -  Difficult doing work/chores - - - - -    Relevant past medical, surgical, family and social history reviewed Past Medical History:  Diagnosis Date  . Anxiety   . Arthritis   . Arthritis of both feet 11/26/2017   xrays March 2019  . Asthma   . Barrett's esophagus   . Bronchitis   . Calcaneal spur of both feet 11/26/2017   Foot xrays March 2019  . Depression   . Diverticulosis   . Esophageal stricture   . Fibromyalgia   . GERD (gastroesophageal reflux disease)   . Hx of adenomatous colonic polyps   . Hyperlipidemia   . Hypertension   . Intramural leiomyoma of uterus   . Myocardial infarction ([place])   . Stroke [facility])    Past Surgical History:  Procedure Laterality Date  .  ABDOMINAL HYSTERECTOMY    . INCONTINENCE SURGERY    . TUBAL LIGATION    . tumor excision ovaries     Family History  Problem Relation Age of Onset  . Ovarian cancer Sister   . Cancer Sister   . Diabetes Sister   . Heart disease Sister   . Lung cancer Sister   . Colon polyps Mother   . Diabetes Mother   . Parkinson's disease Cousin   . Cancer Father        lung  . Diabetes Daughter   . Colon cancer Neg Hx   . Esophageal cancer Neg Hx   . Pancreatic cancer Neg Hx   . Stomach cancer Neg Hx    Social History   Tobacco Use  . Smoking status: Former Smoker    Packs/day: 0.50    Years: 8.00    Pack years: 4.00    Types: Cigarettes    Last attempt to quit: 05/27/2011    Years since quitting: 6.5  . Smokeless tobacco: Never Used  Substance Use Topics  . Alcohol use: No  . Drug use: No    Interim medical history since last visit reviewed. Allergies and medications reviewed  Review of Systems Per HPI unless  specifically indicated above     Objective:    BP 126/64   Pulse 67   Temp 98.1 F (36.7 C) (Oral)   Resp 14   Ht 5\' 6"  (1.676 m)   Wt 217 lb 9.6 oz (98.7 kg)   SpO2 96%   BMI 35.12 kg/m   Wt Readings from Last 3 Encounters:  12/09/17 217 lb 9.6 oz (98.7 kg)  11/26/17 218 lb (98.9 kg)  11/04/17 217 lb 1.6 oz (98.5 kg)    Physical Exam  Constitutional: She appears well-developed and well-nourished. No distress.  obese  HENT:  Head: Normocephalic and atraumatic.  Eyes: EOM are normal. No scleral icterus.  Neck: No thyromegaly present.  Cardiovascular: Normal rate, regular rhythm and normal heart sounds.  No murmur heard. Pulmonary/Chest: Effort normal and breath sounds normal. No respiratory distress. She has no wheezes.  Abdominal: Soft. Bowel sounds are normal. She exhibits no distension.  Musculoskeletal: Normal range of motion. She exhibits no edema.  Neurological: She is alert. She exhibits normal muscle tone.  Skin: Skin is warm and dry. She is not diaphoretic. No pallor.  Psychiatric: She has a normal mood and affect. Her behavior is normal. Judgment and thought content normal.    Results for orders placed or performed in visit on 11/26/17  Urine Culture  Result Value Ref Range   MICRO NUMBER: [id number]    SPECIMEN QUALITY: ADEQUATE    Sample Source URINE    STATUS: FINAL    Result:      Multiple organisms present, each less than 10,000 CFU/mL. These organisms, commonly found on external and internal genitalia, are considered to be colonizers. No further testing performed.  Urinalysis w microscopic + reflex cultur  Result Value Ref Range   Color, Urine YELLOW YELLOW   APPearance CLEAR CLEAR   Specific Gravity, Urine 1.020 1.001 - 1.03   pH 7.0 5.0 - 8.0   Glucose, UA NEGATIVE NEGATIVE   Bilirubin Urine NEGATIVE NEGATIVE   Ketones, ur NEGATIVE NEGATIVE   Hgb urine dipstick NEGATIVE NEGATIVE   Protein, ur NEGATIVE NEGATIVE   Nitrites, Initial NEGATIVE  NEGATIVE   Leukocyte Esterase 2+ (A) NEGATIVE   WBC, UA 10-20 (A) 0 - 5 /HPF   RBC /  HPF NONE SEEN 0 - 2 /HPF   Squamous Epithelial / LPF 6-10 (A) < OR = 5 /HPF   Bacteria, UA NONE SEEN NONE SEEN /HPF   Hyaline Cast NONE SEEN NONE SEEN /LPF  COMPLETE METABOLIC PANEL WITH GFR  Result Value Ref Range   Glucose, Bld 93 65 - 139 mg/dL   BUN 13 7 - 25 mg/dL   Creat 0.72 0.50 - 0.99 mg/dL   GFR, Est Non African American 88 > OR = 60 mL/min/1.29m2   GFR, Est African American 102 > OR = 60 mL/min/1.47m2   BUN/Creatinine Ratio NOT APPLICABLE 6 - 22 (calc)   Sodium 140 135 - 146 mmol/L   Potassium 4.3 3.5 - 5.3 mmol/L   Chloride 105 98 - 110 mmol/L   CO2 28 20 - 32 mmol/L   Calcium 9.2 8.6 - 10.4 mg/dL   Total Protein 6.2 6.1 - 8.1 g/dL   Albumin 4.5 3.6 - 5.1 g/dL   Globulin 1.7 (L) 1.9 - 3.7 g/dL (calc)   AG Ratio 2.6 (H) 1.0 - 2.5 (calc)   Total Bilirubin 0.4 0.2 - 1.2 mg/dL   Alkaline phosphatase (APISO) 74 33 - 130 U/L   AST 17 10 - 35 U/L   ALT 23 6 - 29 U/L  CBC with Differential/Platelet  Result Value Ref Range   WBC 6.6 3.8 - 10.8 Thousand/uL   RBC 4.33 3.80 - 5.10 Million/uL   Hemoglobin 13.4 11.7 - 15.5 g/dL   HCT 38.9 35.0 - 45.0 %   MCV 89.8 80.0 - 100.0 fL   MCH 30.9 27.0 - 33.0 pg   MCHC 34.4 32.0 - 36.0 g/dL   RDW 12.8 11.0 - 15.0 %   Platelets 337 140 - 400 Thousand/uL   MPV 9.2 7.5 - 12.5 fL   Neutro Abs 4,072 1,500 - 7,800 cells/uL   Lymphs Abs 1,881 850 - 3,900 cells/uL   WBC mixed population 436 200 - 950 cells/uL   Eosinophils Absolute 158 15 - 500 cells/uL   Basophils Absolute 53 0 - 200 cells/uL   Neutrophils Relative % 61.7 %   Total Lymphocyte 28.5 %   Monocytes Relative 6.6 %   Eosinophils Relative 2.4 %   Basophils Relative 0.8 %  REFLEXIVE URINE CULTURE  Result Value Ref Range   REFLEXIVE URINE CULTURE CULTURE INDICATED - RESULTS TO FOLLOW       Assessment & Plan:   Problem List Items Addressed This Visit      Digestive   GERD    Managed  by GI      Gastro-esophageal reflux disease without esophagitis    Managed by GI      ESOPHAGEAL STRICTURE    Managed by GI        Other   Major depression in partial remission (HCC)   Relevant Medications   escitalopram (LEXAPRO) 10 MG tablet   Obesity (BMI 35.0-39.9 without comorbidity)    Encouraged weight loss      Hyperlipidemia - Primary    Encouraged diet low in saturated fats      Relevant Medications   rosuvastatin (CRESTOR) 10 MG tablet    Other Visit Diagnoses    Screening for osteoporosis       Relevant Orders   MM Digital Screening   Upper respiratory tract infection, unspecified type       Relevant Medications   loratadine (CLARITIN) 10 MG tablet   Liver lesion       Relevant Orders  US Abdomen Limited RUQ       Follow up plan: Return in about 6 months (around 06/10/2018) for follow-up visit with Dr. Sanda Klein.  An after-visit summary was printed and given to the patient at [place].  Please see the patient instructions which may contain other information and recommendations beyond what is mentioned above in the assessment and plan.  Meds ordered this encounter  Medications  . loratadine (CLARITIN) 10 MG tablet    Sig: Take 1 tablet (10 mg total) by mouth daily. If needed for allergies    Dispense:  30 tablet    Refill:  11    Order Specific Question:   Supervising Provider    Answer:   Jolan Upchurch, Satira Anis [id number]  . rosuvastatin (CRESTOR) 10 MG tablet    Sig: Take 1 tablet (10 mg total) by mouth at bedtime.    Dispense:  90 tablet    Refill:  1    Order Specific Question:   Supervising Provider    Answer:   Mosetta Ferdinand, Satira Anis [id number]  . escitalopram (LEXAPRO) 10 MG tablet    Sig: Take 1 tablet (10 mg total) by mouth daily.    Dispense:  90 tablet    Refill:  1    Orders Placed This Encounter  Procedures  . MM Digital Screening  . US Abdomen Limited RUQ

## 2017-12-09 NOTE — Patient Instructions (Addendum)
-   You can look on the www.goodrx.com website for coupons and the cheapest place to get your medications.  Keep up the efforts at weight loss Check out the information at familydoctor.org entitled "Nutrition for Weight Loss: What You Need to Know about Fad Diets" Try to lose between 1-2 pounds per week by taking in fewer calories and burning off more calories You can succeed by limiting portions, limiting foods dense in calories and fat, becoming more active, and drinking 8 glasses of water a day (64 ounces) Don't skip meals, especially breakfast, as skipping meals may alter your metabolism Do not use over-the-counter weight loss pills or gimmicks that claim rapid weight loss A healthy BMI (or body mass index) is between 18.5 and 24.9 You can calculate your ideal BMI at the [place] website [employer]fr

## 2017-12-10 ENCOUNTER — Ambulatory Visit: Payer: Medicare HMO | Admitting: Family Medicine

## 2017-12-22 DIAGNOSIS — E669 Obesity, unspecified: Secondary | ICD-10-CM | POA: Insufficient documentation

## 2017-12-22 NOTE — Assessment & Plan Note (Signed)
Managed by GI 

## 2017-12-22 NOTE — Assessment & Plan Note (Signed)
Encouraged weight loss 

## 2017-12-22 NOTE — Assessment & Plan Note (Signed)
Encouraged diet low in saturated fats

## 2017-12-24 ENCOUNTER — Other Ambulatory Visit: Payer: Self-pay | Admitting: Family Medicine

## 2017-12-24 DIAGNOSIS — G3184 Mild cognitive impairment, so stated: Secondary | ICD-10-CM | POA: Diagnosis not present

## 2017-12-24 DIAGNOSIS — G9349 Other encephalopathy: Secondary | ICD-10-CM | POA: Diagnosis not present

## 2017-12-24 DIAGNOSIS — M5417 Radiculopathy, lumbosacral region: Secondary | ICD-10-CM | POA: Diagnosis not present

## 2017-12-24 DIAGNOSIS — G5621 Lesion of ulnar nerve, right upper limb: Secondary | ICD-10-CM | POA: Diagnosis not present

## 2017-12-24 DIAGNOSIS — G5603 Carpal tunnel syndrome, bilateral upper limbs: Secondary | ICD-10-CM | POA: Diagnosis not present

## 2017-12-24 DIAGNOSIS — Z8673 Personal history of transient ischemic attack (TIA), and cerebral infarction without residual deficits: Secondary | ICD-10-CM | POA: Diagnosis not present

## 2017-12-24 DIAGNOSIS — Z1231 Encounter for screening mammogram for malignant neoplasm of breast: Secondary | ICD-10-CM

## 2017-12-24 DIAGNOSIS — G250 Essential tremor: Secondary | ICD-10-CM | POA: Diagnosis not present

## 2017-12-24 DIAGNOSIS — M5412 Radiculopathy, cervical region: Secondary | ICD-10-CM | POA: Diagnosis not present

## 2017-12-28 ENCOUNTER — Ambulatory Visit
Admission: RE | Admit: 2017-12-28 | Discharge: 2017-12-28 | Disposition: A | Discharge status: Home / Self Care | Payer: Medicare HMO | Source: Ambulatory Visit | Attending: Family Medicine | Admitting: Family Medicine

## 2017-12-28 DIAGNOSIS — Z1231 Encounter for screening mammogram for malignant neoplasm of breast: Secondary | ICD-10-CM

## 2017-12-29 ENCOUNTER — Ambulatory Visit: Payer: Medicare HMO

## 2017-12-29 DIAGNOSIS — N3281 Overactive bladder: Secondary | ICD-10-CM | POA: Diagnosis not present

## 2018-01-06 DIAGNOSIS — G250 Essential tremor: Secondary | ICD-10-CM | POA: Diagnosis not present

## 2018-01-06 DIAGNOSIS — M542 Cervicalgia: Secondary | ICD-10-CM | POA: Diagnosis not present

## 2018-01-06 DIAGNOSIS — G3184 Mild cognitive impairment, so stated: Secondary | ICD-10-CM | POA: Diagnosis not present

## 2018-01-06 DIAGNOSIS — Z79899 Other long term (current) drug therapy: Secondary | ICD-10-CM | POA: Diagnosis not present

## 2018-01-06 DIAGNOSIS — M5442 Lumbago with sciatica, left side: Secondary | ICD-10-CM | POA: Diagnosis not present

## 2018-01-06 DIAGNOSIS — M5441 Lumbago with sciatica, right side: Secondary | ICD-10-CM | POA: Diagnosis not present

## 2018-01-07 ENCOUNTER — Telehealth: Payer: Self-pay | Admitting: Family Medicine

## 2018-01-07 DIAGNOSIS — Z780 Asymptomatic menopausal state: Secondary | ICD-10-CM | POA: Insufficient documentation

## 2018-01-07 NOTE — Telephone Encounter (Signed)
The diagnosis needs to be "postmenopausal" I have entered a new order Screening for osteoporosis won't pay

## 2018-01-07 NOTE — Telephone Encounter (Signed)
Copied from [place] [id number]. Topic: Quick Communication - See Telephone Encounter >> Jan 07, 2018  4:25 PM [place], Laban Emperor wrote: CRM for notification. See Telephone encounter for: 01/07/18.  Ally with [facility] states they need an order change from screen for ostoprosis to either estrogen deficiency or osteopeia. She also states they can never do an order for screen for ostoprosis.  [phone] ext [phone]

## 2018-01-07 NOTE — Assessment & Plan Note (Signed)
DEXA scan

## 2018-01-08 ENCOUNTER — Encounter: Payer: Self-pay | Admitting: Podiatry

## 2018-01-08 ENCOUNTER — Telehealth: Payer: Self-pay | Admitting: Family Medicine

## 2018-01-08 ENCOUNTER — Ambulatory Visit: Payer: Medicare HMO

## 2018-01-08 ENCOUNTER — Ambulatory Visit (INDEPENDENT_AMBULATORY_CARE_PROVIDER_SITE_OTHER): Payer: Medicare HMO | Admitting: Podiatry

## 2018-01-08 DIAGNOSIS — E2839 Other primary ovarian failure: Secondary | ICD-10-CM | POA: Insufficient documentation

## 2018-01-08 DIAGNOSIS — Q828 Other specified congenital malformations of skin: Secondary | ICD-10-CM

## 2018-01-08 NOTE — Telephone Encounter (Signed)
New order entered I've never had a rejection with "postmenopausal" so I appreciate the clarification Thank you

## 2018-01-08 NOTE — Telephone Encounter (Signed)
-----   Message from Dennard Schaumann, [place] sent at 01/08/2018  4:36 PM EDT ----- Regarding: FW: Unable to Schedule. Incorrect Diagnosis    ----- Message ----- From: Adele Schilder Sent: 01/08/2018   3:27 PM To: Dennard Schaumann, CMA Subject: Unable to Schedule. Incorrect Diagnosis        Hi there. I stated that it would need to be Osteopenia or Estrogen Deficiency, but I see the physician has put Post Menopausal which we still cannot use. Please let her know it will need to be Osteopenia or Estrogen Deficiency. Thank you.

## 2018-01-11 NOTE — Progress Notes (Signed)
   Subjective: 67 year old female presenting today with a chief complaint of a corn on the left fifth toe and plantar aspect of the right forefoot that have been present for several weeks. She reports associated pain from the lesions. Walking and bearing weight increases the pain. She has not done anything to treat the symptoms. Patient is here for further evaluation and treatment.   Past Medical History:  Diagnosis Date  . Anxiety   . Arthritis   . Arthritis of both feet 11/26/2017   xrays March 2019  . Asthma   . Barrett's esophagus   . Bronchitis   . Calcaneal spur of both feet 11/26/2017   Foot xrays March 2019  . Depression   . Diverticulosis   . Esophageal stricture   . Fibromyalgia   . GERD (gastroesophageal reflux disease)   . Hx of adenomatous colonic polyps   . Hyperlipidemia   . Hypertension   . Intramural leiomyoma of uterus   . Myocardial infarction ([place])   . Stroke [facility])      Objective:  Physical Exam General: Alert and oriented x3 in no acute distress  Dermatology: Hyperkeratotic lesions present on the left fifth toe and right forefoot. Pain on palpation with a central nucleated core noted. Skin is warm, dry and supple bilateral lower extremities. Negative for open lesions or macerations.  Vascular: Palpable pedal pulses bilaterally. No edema or erythema noted. Capillary refill within normal limits.  Neurological: Epicritic and protective threshold grossly intact bilaterally.   Musculoskeletal Exam: Pain on palpation at the keratotic lesion noted. Range of motion within normal limits bilateral. Muscle strength 5/5 in all groups bilateral.  Assessment: 1. Corn left fifth toe 2. Porokeratosis right forefoot   Plan of Care:  1. Patient evaluated 2. Excisional debridement of keratoic lesions using a chisel blade was performed without incident.  3. Salinocaine applied to areas and light dressing placed. 4. Patient is to return to the clinic PRN.   Edrick Kins, DPM [facility]  Dr. Edrick Kins, [address]                                        [place], [place] [address]                Office [phone]  Fax [phone]

## 2018-02-05 DIAGNOSIS — R69 Illness, unspecified: Secondary | ICD-10-CM | POA: Diagnosis not present

## 2018-02-05 DIAGNOSIS — E785 Hyperlipidemia, unspecified: Secondary | ICD-10-CM | POA: Diagnosis not present

## 2018-02-05 DIAGNOSIS — E669 Obesity, unspecified: Secondary | ICD-10-CM | POA: Diagnosis not present

## 2018-02-05 DIAGNOSIS — I10 Essential (primary) hypertension: Secondary | ICD-10-CM | POA: Diagnosis not present

## 2018-02-05 DIAGNOSIS — N3281 Overactive bladder: Secondary | ICD-10-CM | POA: Diagnosis not present

## 2018-02-05 DIAGNOSIS — G8929 Other chronic pain: Secondary | ICD-10-CM | POA: Diagnosis not present

## 2018-02-05 DIAGNOSIS — F419 Anxiety disorder, unspecified: Secondary | ICD-10-CM | POA: Diagnosis not present

## 2018-02-05 DIAGNOSIS — G629 Polyneuropathy, unspecified: Secondary | ICD-10-CM | POA: Diagnosis not present

## 2018-02-05 DIAGNOSIS — K219 Gastro-esophageal reflux disease without esophagitis: Secondary | ICD-10-CM | POA: Diagnosis not present

## 2018-02-05 DIAGNOSIS — J309 Allergic rhinitis, unspecified: Secondary | ICD-10-CM | POA: Diagnosis not present

## 2018-02-25 ENCOUNTER — Encounter: Payer: Self-pay | Admitting: Family Medicine

## 2018-02-25 ENCOUNTER — Ambulatory Visit (INDEPENDENT_AMBULATORY_CARE_PROVIDER_SITE_OTHER): Payer: Medicare HMO | Admitting: Family Medicine

## 2018-02-25 VITALS — BP 122/62 | HR 77 | Temp 98.0°F | Resp 14 | Ht 68.0 in | Wt 201.0 lb

## 2018-02-25 DIAGNOSIS — R300 Dysuria: Secondary | ICD-10-CM

## 2018-02-25 DIAGNOSIS — E669 Obesity, unspecified: Secondary | ICD-10-CM | POA: Diagnosis not present

## 2018-02-25 DIAGNOSIS — R350 Frequency of micturition: Secondary | ICD-10-CM | POA: Diagnosis not present

## 2018-02-25 DIAGNOSIS — R7309 Other abnormal glucose: Secondary | ICD-10-CM | POA: Diagnosis not present

## 2018-02-25 MED ORDER — NITROFURANTOIN MONOHYD MACRO 100 MG PO CAPS: 100.0000 mg | ORAL_CAPSULE | Freq: Two times a day (BID) | ORAL | 0 refills | Status: DC

## 2018-02-25 NOTE — Progress Notes (Signed)
BP 122/62   Pulse 77   Temp 98 F (36.7 C) (Oral)   Resp 14   Ht 5\' 8"  (1.727 m)   Wt 201 lb (91.2 kg)   SpO2 96%   BMI 30.56 kg/m    Subjective:    Patient ID: Robin Bryan, female    DOB: 1950/12/25, 67 y.o.   MRN: [id number]  HPI: Robin Bryan is a 67 y.o. female  Chief Complaint  Patient presents with  . Urinary Tract Infection    onset 2 days, symptoms of frequency, lower abdomen pain,odor and burning     HPI She is here for an acute visit She thinks she has a UTI; she has the sensation, the feeling when you go to the bathroom; going every few minutes; strong smell; started yesterday; no pressure or discomfort in lower abdomen; back gives her problems anyway, nothing new or changed; no fevers; no N/V  She is working on healthy eating; no bread; increased activity as well  Saw foot doctor in May for callus on the side of the toe; podiatrist is seeing her, thinking about surgery to rmove bone in 4th toe on the left foot to allevaiate pressure; no redness  Lab Results  Component Value Date   HGBA1C 5.5 11/04/2017   Fall Risk  02/25/2018 12/09/2017 11/26/2017 09/10/2017 05/12/2017  Falls in the past year? No No No No No  Number falls in past yr: - - - - -  Comment - - - - -  Injury with Fall? - - - - -  Comment - - - - -  Risk Factor Category  - - - - -  Risk for fall due to : - - - - -  Follow up - - - - -   Functional Status Survey: Is the patient deaf or have difficulty hearing?: No Does the patient have difficulty seeing, even when wearing glasses/contacts?: No Does the patient have difficulty concentrating, remembering, or making decisions?: No Does the patient have difficulty walking or climbing stairs?: No Does the patient have difficulty dressing or bathing?: No Does the patient have difficulty doing errands alone such as visiting a doctor's office or shopping?: No  Depression screen [facility] 2/9 02/25/2018 12/09/2017 11/26/2017 09/10/2017 05/12/2017  Decreased  Interest 0 0 0 0 0  Down, Depressed, Hopeless 0 0 0 1 0  PHQ - 2 Score 0 0 0 1 0  Altered sleeping 0 - - - -  Tired, decreased energy 0 - - - -  Change in appetite 0 - - - -  Feeling bad or failure about yourself  0 - - - -  Trouble concentrating 0 - - - -  Moving slowly or fidgety/restless 0 - - - -  Suicidal thoughts 0 - - - -  PHQ-9 Score 0 - - - -  Difficult doing work/chores Not difficult at all - - - -  Some recent data might be hidden    Relevant past medical, surgical, family and social history reviewed Past Medical History:  Diagnosis Date  . Anxiety   . Arthritis   . Arthritis of both feet 11/26/2017   xrays March 2019  . Asthma   . Barrett's esophagus   . Bronchitis   . Calcaneal spur of both feet 11/26/2017   Foot xrays March 2019  . Depression   . Diverticulosis   . Esophageal stricture   . Fibromyalgia   . GERD (gastroesophageal reflux disease)   . Hx  of adenomatous colonic polyps   . Hyperlipidemia   . Hypertension   . Intramural leiomyoma of uterus   . Myocardial infarction ([place])   . Stroke [facility])    Past Surgical History:  Procedure Laterality Date  . ABDOMINAL HYSTERECTOMY    . INCONTINENCE SURGERY    . TUBAL LIGATION    . tumor excision ovaries     Family History  Problem Relation Age of Onset  . Ovarian cancer Sister   . Cancer Sister   . Diabetes Sister   . Heart disease Sister   . Lung cancer Sister   . Colon polyps Mother   . Diabetes Mother   . Parkinson's disease Cousin   . Cancer Father        lung  . Diabetes Daughter   . Colon cancer Neg Hx   . Esophageal cancer Neg Hx   . Pancreatic cancer Neg Hx   . Stomach cancer Neg Hx    Social History   Tobacco Use  . Smoking status: Former Smoker    Packs/day: 0.50    Years: 8.00    Pack years: 4.00    Types: Cigarettes    Last attempt to quit: 05/27/2011    Years since quitting: 6.7  . Smokeless tobacco: Never Used  Substance Use Topics  . Alcohol use: No  . Drug use: No     Interim medical history since last visit reviewed. Allergies and medications reviewed  Review of Systems  Constitutional: Negative for unexpected weight change ("I have worked hard for this").  Respiratory: Negative for cough.   Gastrointestinal: Negative for blood in stool.  Genitourinary: Negative for hematuria.   Per HPI unless specifically indicated above     Objective:    BP 122/62   Pulse 77   Temp 98 F (36.7 C) (Oral)   Resp 14   Ht 5\' 8"  (1.727 m)   Wt 201 lb (91.2 kg)   SpO2 96%   BMI 30.56 kg/m   Wt Readings from Last 3 Encounters:  02/25/18 201 lb (91.2 kg)  12/09/17 217 lb 9.6 oz (98.7 kg)  11/26/17 218 lb (98.9 kg)    Physical Exam  Constitutional: She appears well-developed and well-nourished.  Obese, weight loss acknowledged, discussed  HENT:  Mouth/Throat: Mucous membranes are normal.  Eyes: EOM are normal. No scleral icterus.  Cardiovascular: Normal rate and regular rhythm.  Pulmonary/Chest: Effort normal and breath sounds normal.  Abdominal: Soft. Normal appearance. There is no tenderness. There is no CVA tenderness.  Feet:  Left Foot:  Skin Integrity: Positive for callus (LEFT medial pinky toe).  Skin: She is not diaphoretic.  Psychiatric: She has a normal mood and affect. Her behavior is normal.      Assessment & Plan:   Problem List Items Addressed This Visit      Other   Obesity (BMI 30.0-34.9)    Praise given for weight loss; she truly believes she has worked hard for this weight loss and she does not think it is cancer or anything unusual; I did advise her that if she loses weight unexpectedly, to contact me      Elevated hemoglobin A1c (Chronic)    Last level controlled; considered because of urinary frequency, as diabetes can cause similar symptoms       Other Visit Diagnoses    Urinary frequency    -  Primary   suspect acute cystitis; will get urin dip and micro with reflex culture; start macrobid; cautioned  about risk of  C diff; hdyrated   Relevant Orders   Urinalysis w microscopic + reflex cultur   Dysuria       suspect cystitis; see above   Relevant Orders   Urinalysis w microscopic + reflex cultur       Follow up plan: No follow-ups on file.  An after-visit summary was printed and given to the patient at [place].  Please see the patient instructions which may contain other information and recommendations beyond what is mentioned above in the assessment and plan.  Meds ordered this encounter  Medications  . nitrofurantoin, macrocrystal-monohydrate, (MACROBID) 100 MG capsule    Sig: Take 1 capsule (100 mg total) by mouth 2 (two) times daily.    Dispense:  14 capsule    Refill:  0    Orders Placed This Encounter  Procedures  . Urinalysis w microscopic + reflex cultur

## 2018-02-25 NOTE — Assessment & Plan Note (Signed)
Last level controlled; considered because of urinary frequency, as diabetes can cause similar symptoms

## 2018-02-25 NOTE — Patient Instructions (Addendum)
Start the antibiotics Please do eat yogurt or kimchi or take a probiotic daily for the next month We want to replace the healthy germs in the gut If you notice foul, watery diarrhea in the next two months, schedule an appointment RIGHT AWAY or go to an urgent care or the emergency room if a holiday or over a weekend  Check out the information at familydoctor.org entitled "Nutrition for Weight Loss: What You Need to Know about Fad Diets" Try to lose between 1-2 pounds per week by taking in fewer calories and burning off more calories You can succeed by limiting portions, limiting foods dense in calories and fat, becoming more active, and drinking 8 glasses of water a day (64 ounces) Don't skip meals, especially breakfast, as skipping meals may alter your metabolism Do not use over-the-counter weight loss pills or gimmicks that claim rapid weight loss A healthy BMI (or body mass index) is between 18.5 and 24.9 You can calculate your ideal BMI at the [place] website ClubMonetize.fr Let me know of any unexpected weight loss  Preventing Unhealthy Weight Gain, Adult Staying at a healthy weight is important. When fat builds up in your body, you may become overweight or obese. These conditions put you at greater risk for developing certain health problems, such as heart disease, diabetes, sleeping problems, joint problems, and some cancers. Unhealthy weight gain is often the result of making unhealthy choices in what you eat. It is also a result of not getting enough exercise. You can make changes to your lifestyle to prevent obesity and stay as healthy as possible. What nutrition changes can be made? To maintain a healthy weight and prevent obesity:  Eat only as much as your body needs. To do this: ? Pay attention to signs that you are hungry or full. Stop eating as soon as you feel full. ? If you feel hungry, try drinking water first. Drink enough water  so your urine is clear or pale yellow. ? Eat smaller portions. ? Look at serving sizes on food labels. Most foods contain more than one serving per container. ? Eat the recommended amount of calories for your gender and activity level. While most active people should eat around 2,000 calories per day, if you are trying to lose weight or are not very active, you main need to eat less calories. Talk to your health care provider or dietitian about how many calories you should eat each day.  Choose healthy foods, such as: ? Fruits and vegetables. Try to fill at least half of your plate at each meal with fruits and vegetables. ? Whole grains, such as whole wheat bread, brown rice, and quinoa. ? Lean meats, such as chicken or fish. ? Other healthy proteins, such as beans, eggs, or tofu. ? Healthy fats, such as nuts, seeds, fatty fish, and olive oil. ? Low-fat or fat-free dairy.  Check food labels and avoid food and drinks that: ? Are high in calories. ? Have added sugar. ? Are high in sodium. ? Have saturated fats or trans fats.  Limit how much you eat of the following foods: ? Prepackaged meals. ? Fast food. ? Fried foods. ? Processed meat, such as bacon, sausage, and deli meats. ? Fatty cuts of red meat and poultry with skin.  Cook foods in healthier ways, such as by baking, broiling, or grilling.  When grocery shopping, try to shop around the outside of the store. This helps you buy mostly fresh foods and avoid canned and  prepackaged foods.  What lifestyle changes can be made?  Exercise at least 30 minutes 5 or more days each week. Exercising includes brisk walking, yard work, biking, running, swimming, and team sports like basketball and soccer. Ask your health care provider which exercises are safe for you.  Do not use any products that contain nicotine or tobacco, such as cigarettes and e-cigarettes. If you need help quitting, ask your health care provider.  Limit alcohol intake  to no more than 1 drink a day for nonpregnant women and 2 drinks a day for men. One drink equals 12 oz of beer, 5 oz of wine, or 1 oz of hard liquor.  Try to get 7-9 hours of sleep each night. What other changes can be made?  Keep a food and activity journal to keep track of: ? What you ate and how many calories you had. Remember to count sauces, dressings, and side dishes. ? Whether you were active, and what exercises you did. ? Your calorie, weight, and activity goals.  Check your weight regularly. Track any changes. If you notice you have gained weight, make changes to your diet or activity routine.  Avoid taking weight-loss medicines or supplements. Talk to your health care provider before starting any new medicine or supplement.  Talk to your health care provider before trying any new diet or exercise plan. Why are these changes important? Eating healthy, staying active, and having healthy habits not only help prevent obesity, they also:  Help you to manage stress and emotions.  Help you to connect with friends and family.  Improve your self-esteem.  Improve your sleep.  Prevent long-term health problems.  What can happen if changes are not made? Being obese or overweight can cause you to develop joint or bone problems, which can make it hard for you to stay active or do activities you enjoy. Being obese or overweight also puts stress on your heart and lungs and can lead to health problems like diabetes, heart disease, and some cancers. Where to find more information: Talk with your health care provider or a dietitian about healthy eating and healthy lifestyle choices. You may also find other information through these resources:  U.S. Department of Agriculture MyPlate: FormerBoss.no  American Heart Association: www.heart.org  Centers for Disease Control and Prevention: http://www.wolf.info/  Summary  Staying at a healthy weight is important. It helps prevent certain  diseases and health problems, such as heart disease, diabetes, joint problems, sleep disorders, and some cancers.  Being obese or overweight can cause you to develop joint or bone problems, which can make it hard for you to stay active or do activities you enjoy.  You can prevent unhealthy weight gain by eating a healthy diet, exercising regularly, not smoking, limiting alcohol, and getting enough sleep.  Talk with your health care provider or a dietitian for guidance about healthy eating and healthy lifestyle choices. This information is not intended to replace advice given to you by your health care provider. Make sure you discuss any questions you have with your health care provider. Document Released: 08/19/2016 Document Revised: 09/24/2016 Document Reviewed: 09/24/2016 Elsevier Interactive Patient Education  2018 [employer].  Obesity, Adult Obesity is the condition of having too much total body fat. Being overweight or obese means that your weight is greater than what is considered healthy for your body size. Obesity is determined by a measurement called BMI. BMI is an estimate of body fat and is calculated from height and weight. For adults,  a BMI of 30 or higher is considered obese. Obesity can eventually lead to other health concerns and major illnesses, including:  Stroke.  Coronary artery disease (CAD).  Type 2 diabetes.  Some types of cancer, including cancers of the colon, breast, uterus, and gallbladder.  Osteoarthritis.  High blood pressure (hypertension).  High cholesterol.  Sleep apnea.  Gallbladder stones.  Infertility problems.  What are the causes? The main cause of obesity is taking in (consuming) more calories than your body uses for energy. Other factors that contribute to this condition may include:  Being born with genes that make you more likely to become obese.  Having a medical condition that causes obesity. These conditions  include: ? Hypothyroidism. ? Polycystic ovarian syndrome (PCOS). ? Binge-eating disorder. ? Cushing syndrome.  Taking certain medicines, such as steroids, antidepressants, and seizure medicines.  Not being physically active (sedentary lifestyle).  Living where there are limited places to exercise safely or buy healthy foods.  Not getting enough sleep.  What increases the risk? The following factors may increase your risk of this condition:  Having a family history of obesity.  Being a woman of African-American descent.  Being a man of Hispanic descent.  What are the signs or symptoms? Having excessive body fat is the main symptom of this condition. How is this diagnosed? This condition may be diagnosed based on:  Your symptoms.  Your medical history.  A physical exam. Your health care provider may measure: ? Your BMI. If you are an adult with a BMI between 25 and less than 30, you are considered overweight. If you are an adult with a BMI of 30 or higher, you are considered obese. ? The distances around your hips and your waist (circumferences). These may be compared to each other to help diagnose your condition. ? Your skinfold thickness. Your health care provider may gently pinch a fold of your skin and measure it.  How is this treated? Treatment for this condition often includes changing your lifestyle. Treatment may include some or all of the following:  Dietary changes. Work with your health care provider and a dietitian to set a weight-loss goal that is healthy and reasonable for you. Dietary changes may include eating: ? Smaller portions. A portion size is the amount of a particular food that is healthy for you to eat at one time. This varies from person to person. ? Low-calorie or low-fat options. ? More whole grains, fruits, and vegetables.  Regular physical activity. This may include aerobic activity (cardio) and strength training.  Medicine to help you lose  weight. Your health care provider may prescribe medicine if you are unable to lose 1 pound a week after 6 weeks of eating more healthily and doing more physical activity.  Surgery. Surgical options may include gastric banding and gastric bypass. Surgery may be done if: ? Other treatments have not helped to improve your condition. ? You have a BMI of 40 or higher. ? You have life-threatening health problems related to obesity.  Follow these instructions at home:  Eating and drinking   Follow recommendations from your health care provider about what you eat and drink. Your health care provider may advise you to: ? Limit fast foods, sweets, and processed snack foods. ? Choose low-fat options, such as low-fat milk instead of whole milk. ? Eat 5 or more servings of fruits or vegetables every day. ? Eat at home more often. This gives you more control over what you eat. ?  Choose healthy foods when you eat out. ? Learn what a healthy portion size is. ? Keep low-fat snacks on hand. ? Avoid sugary drinks, such as soda, fruit juice, iced tea sweetened with sugar, and flavored milk. ? Eat a healthy breakfast.  Drink enough water to keep your urine clear or pale yellow.  Do not go without eating for long periods of time (do not fast) or follow a fad diet. Fasting and fad diets can be unhealthy and even dangerous. Physical Activity  Exercise regularly, as told by your health care provider. Ask your health care provider what types of exercise are safe for you and how often you should exercise.  Warm up and stretch before being active.  Cool down and stretch after being active.  Rest between periods of activity. Lifestyle  Limit the time that you spend in front of your TV, computer, or video game system.  Find ways to reward yourself that do not involve food.  Limit alcohol intake to no more than 1 drink a day for nonpregnant women and 2 drinks a day for men. One drink equals 12 oz of beer,  5 oz of wine, or 1 oz of hard liquor. General instructions  Keep a weight loss journal to keep track of the food you eat and how much you exercise you get.  Take over-the-counter and prescription medicines only as told by your health care provider.  Take vitamins and supplements only as told by your health care provider.  Consider joining a support group. Your health care provider may be able to recommend a support group.  Keep all follow-up visits as told by your health care provider. This is important. Contact a health care provider if:  You are unable to meet your weight loss goal after 6 weeks of dietary and lifestyle changes. This information is not intended to replace advice given to you by your health care provider. Make sure you discuss any questions you have with your health care provider. Document Released: 09/25/2004 Document Revised: 01/21/2016 Document Reviewed: 06/06/2015 Elsevier Interactive Patient Education  2018 [employer].  Urinary Tract Infection, Adult A urinary tract infection (UTI) is an infection of any part of the urinary tract. The urinary tract includes the:  Kidneys.  Ureters.  Bladder.  Urethra.  These organs make, store, and get rid of pee (urine) in the body. Follow these instructions at home:  Take over-the-counter and prescription medicines only as told by your doctor.  If you were prescribed an antibiotic medicine, take it as told by your doctor. Do not stop taking the antibiotic even if you start to feel better.  Avoid the following drinks: ? Alcohol. ? Caffeine. ? Tea. ? Carbonated drinks.  Drink enough fluid to keep your pee clear or pale yellow.  Keep all follow-up visits as told by your doctor. This is important.  Make sure to: ? Empty your bladder often and completely. Do not to hold pee for long periods of time. ? Empty your bladder before and after sex. ? Wipe from front to back after a bowel movement if you are female.  Use each tissue one time when you wipe. Contact a doctor if:  You have back pain.  You have a fever.  You feel sick to your stomach (nauseous).  You throw up (vomit).  Your symptoms do not get better after 3 days.  Your symptoms go away and then come back. Get help right away if:  You have very bad back pain.  You have very bad lower belly (abdominal) pain.  You are throwing up and cannot keep down any medicines or water. This information is not intended to replace advice given to you by your health care provider. Make sure you discuss any questions you have with your health care provider. Document Released: 02/04/2008 Document Revised: 01/24/2016 Document Reviewed: 07/09/2015 Elsevier Interactive Patient Education  [employer].

## 2018-02-25 NOTE — Assessment & Plan Note (Signed)
Praise given for weight loss; she truly believes she has worked hard for this weight loss and she does not think it is cancer or anything unusual; I did advise her that if she loses weight unexpectedly, to contact me

## 2018-02-28 LAB — URINALYSIS W MICROSCOPIC + REFLEX CULTURE
BILIRUBIN URINE: NEGATIVE
GLUCOSE, UA: NEGATIVE
HGB URINE DIPSTICK: NEGATIVE
Ketones, ur: NEGATIVE
NITRITES URINE, INITIAL: POSITIVE — AB
PROTEIN: NEGATIVE
RBC / HPF: NONE SEEN /HPF (ref 0–2)
SPECIFIC GRAVITY, URINE: 1.022 (ref 1.001–1.03)
pH: 8 (ref 5.0–8.0)

## 2018-02-28 LAB — URINE CULTURE
MICRO NUMBER:: 90774232
SPECIMEN QUALITY: ADEQUATE

## 2018-02-28 LAB — CULTURE INDICATED

## 2018-03-19 ENCOUNTER — Encounter: Payer: Self-pay | Admitting: Family Medicine

## 2018-03-19 ENCOUNTER — Ambulatory Visit (INDEPENDENT_AMBULATORY_CARE_PROVIDER_SITE_OTHER): Payer: Medicare HMO | Admitting: Family Medicine

## 2018-03-19 VITALS — BP 128/68 | HR 78 | Temp 98.1°F | Resp 16 | Ht 68.0 in | Wt 207.4 lb

## 2018-03-19 DIAGNOSIS — R350 Frequency of micturition: Secondary | ICD-10-CM | POA: Diagnosis not present

## 2018-03-19 DIAGNOSIS — N3281 Overactive bladder: Secondary | ICD-10-CM | POA: Diagnosis not present

## 2018-03-19 DIAGNOSIS — N941 Unspecified dyspareunia: Secondary | ICD-10-CM | POA: Diagnosis not present

## 2018-03-19 LAB — POCT URINALYSIS DIPSTICK
Appearance: NEGATIVE
Bilirubin, UA: NEGATIVE
GLUCOSE UA: NEGATIVE
Ketones, UA: NEGATIVE
LEUKOCYTES UA: NEGATIVE
NITRITE UA: NEGATIVE
Protein, UA: NEGATIVE
RBC UA: NEGATIVE
SPEC GRAV UA: 1.02 (ref 1.010–1.025)
Urobilinogen, UA: 0.2 E.U./dL
pH, UA: 8 (ref 5.0–8.0)

## 2018-03-19 NOTE — Progress Notes (Signed)
Name: Robin Bryan   MRN: [id number]    DOB: 1950/09/07   Date:03/19/2018       Progress Note  Subjective  Chief Complaint  Chief Complaint  Patient presents with  . Urinary Frequency    Pt presents with concern for urinary frequency. She was seen February 25, 2018 and diagnosed with a UTI, she completed her antibiotics, but the urinary frequency never subsided. She does endorse some low anterior pelvic pain.  She notes that her husband works out of town and when he comes home they have intercourse frequently, then he leaves again - this pattern has started causing dyspareunia - she states she does not use lubrication for intercourse.  She declines gonorrhea and chlamydia testing today.  She does see Urologist - Dr. Jeffie Pollock for overactive bladder and is taking Myrbetriq. Denies fevers/chills, abnormal back pain, nausea, vomiting, frank hematuria, dysuria.  Patient Active Problem List   Diagnosis Date Noted  . Overactive bladder 03/19/2018  . Obesity (BMI 30.0-34.9) 02/25/2018  . Estrogen deficiency 01/08/2018  . Postmenopausal 01/07/2018  . Back pain 11/26/2017  . Chronic kidney disease, stage III (moderate) ([place]) 11/26/2017  . Calcaneal spur of both feet 11/26/2017  . Arthritis of both feet 11/26/2017  . Acute left-sided thoracic back pain 10/30/2015  . Temporary cerebral vascular dysfunction 07/16/2015  . Adaptation reaction 07/16/2015  . Foot pain 07/16/2015  . BP (high blood pressure) 07/16/2015  . Acid reflux 07/16/2015  . Abnormal LFTs 07/16/2015  . Pericardial effusion 07/13/2015  . Mass of chest wall, left 06/29/2015  . Lipoma 06/29/2015  . Pure hypercholesterolemia 04/28/2015  . Gastro-esophageal reflux disease without esophagitis 04/28/2015  . Benign essential HTN 04/28/2015  . Hyperlipidemia 03/19/2015  . Elevated hemoglobin A1c 03/19/2015  . Neck muscle spasm 03/19/2015  . Cognitive change 02/07/2015  . Persistent dry cough 02/07/2015  . Major depression in partial  remission ([place]) 02/07/2015  . Fibrositis 02/07/2015  . Anxiety disorder 02/07/2015  . History of anaphylaxis 02/07/2015  . Pubic bone pain 10/02/2014  . Mass of pelvis 06/05/2014  . Ovarian cyst, left 05/26/2014  . Abdominal pain, chronic, left lower quadrant 05/26/2014  . CEREBROVASCULAR DISEASE 04/23/2010  . COLONIC POLYPS, ADENOMATOUS 12/09/2006  . ESOPHAGEAL STRICTURE 12/09/2006  . GERD 12/09/2006  . BARRETTS ESOPHAGUS 12/09/2006  . DIVERTICULOSIS, COLON 12/09/2006    Social History   Tobacco Use  . Smoking status: Former Smoker    Packs/day: 0.50    Years: 8.00    Pack years: 4.00    Types: Cigarettes    Last attempt to quit: 05/27/2011    Years since quitting: 6.8  . Smokeless tobacco: Never Used  Substance Use Topics  . Alcohol use: No     Current Outpatient Medications:  .  clopidogrel (PLAVIX) 75 MG tablet, Take 1 tablet (75 mg total) by mouth daily., Disp: 90 tablet, Rfl: 3 .  diphenhydrAMINE (BENADRYL) 25 mg capsule, Take 1 capsule (25 mg total) by mouth every 8 (eight) hours as needed., Disp: 30 capsule, Rfl: 0 .  EPINEPHrine 0.3 mg/0.3 mL IJ SOAJ injection, Inject 0.3 mLs (0.3 mg total) into the muscle once., Disp: 1 Device, Rfl: 0 .  escitalopram (LEXAPRO) 10 MG tablet, Take 1 tablet (10 mg total) by mouth daily., Disp: 90 tablet, Rfl: 1 .  fluticasone (FLONASE) 50 MCG/ACT nasal spray, Place 2 sprays into both nostrils daily., Disp: 16 g, Rfl: 6 .  gabapentin (NEURONTIN) 400 MG capsule, Take 800 mg by mouth 4 (four) times  daily. , Disp: , Rfl:  .  HYDROcodone-acetaminophen (NORCO/VICODIN) 5-325 MG tablet, Take 1 tablet by mouth every 6 (six) hours as needed for moderate pain., Disp: , Rfl:  .  loratadine (CLARITIN) 10 MG tablet, Take 1 tablet (10 mg total) by mouth daily. If needed for allergies, Disp: 30 tablet, Rfl: 11 .  MYRBETRIQ 50 MG TB24 tablet, TK 1 T PO QD, Disp: , Rfl: 3 .  pantoprazole (PROTONIX) 40 MG tablet, TAKE 1 TABLET(40 MG) BY MOUTH TWICE  DAILY, Disp: 180 tablet, Rfl: 3 .  primidone (MYSOLINE) 50 MG tablet, Take 1 tablet by mouth daily., Disp: , Rfl: 6 .  rosuvastatin (CRESTOR) 10 MG tablet, Take 1 tablet (10 mg total) by mouth at bedtime., Disp: 90 tablet, Rfl: 1 .  tiZANidine (ZANAFLEX) 2 MG tablet, Take 1 tablet (2 mg total) by mouth every 8 (eight) hours as needed for muscle spasms., Disp: 30 tablet, Rfl: 0 .  UNABLE TO FIND, APPLY 1 2 GMS TO AFFECTED AREA 3 4 TIMES DAILY AS NEEDED FOR PAIN  THIS IS A COMPOUNDED CREAM 1 PUMP EQUALS 1 GRAM, Disp: , Rfl: 5 .  nitrofurantoin, macrocrystal-monohydrate, (MACROBID) 100 MG capsule, Take 1 capsule (100 mg total) by mouth 2 (two) times daily. (Patient not taking: Reported on 03/19/2018), Disp: 14 capsule, Rfl: 0 .  solifenacin (VESICARE) 10 MG tablet, Take 10 mg by mouth daily., Disp: , Rfl:  .  telmisartan (MICARDIS) 80 MG tablet, Take 1 tablet (80 mg total) by mouth daily., Disp: 90 tablet, Rfl: 1 .  venlafaxine XR (EFFEXOR-XR) 37.5 MG 24 hr capsule, Take 1 capsule (37.5 mg total) by mouth daily with breakfast., Disp: 5 capsule, Rfl: 0  Allergies  Allergen Reactions  . Shellfish Allergy Swelling  . Vicodin [Hydrocodone-Acetaminophen] Nausea Only   ROS: Ten systems reviewed and is negative except as mentioned in HPI   Objective  Vitals:   03/19/18 1116  BP: 128/68  Pulse: 78  Resp: 16  Temp: 98.1 F (36.7 C)  TempSrc: Oral  SpO2: 97%  Weight: 207 lb 6.4 oz (94.1 kg)  Height: 5\' 8"  (1.727 m)   Body mass index is 31.54 kg/m.  Nursing Note and Vital Signs reviewed.  Physical Exam  Constitutional: She is oriented to person, place, and time. She appears well-developed and well-nourished.  HENT:  Head: Normocephalic and atraumatic.  Right Ear: Tympanic membrane and ear canal normal.  Left Ear: Tympanic membrane and ear canal normal.  Mouth/Throat: Uvula is midline and mucous membranes are normal. No tonsillar exudate.  Eyes: Conjunctivae and EOM are normal. No  scleral icterus.  Neck: Normal range of motion. Neck supple.  Cardiovascular: Normal rate, regular rhythm and normal heart sounds.  Pulmonary/Chest: Effort normal and breath sounds normal. No respiratory distress.  Abdominal: Soft. Bowel sounds are normal. She exhibits no mass. There is no hepatosplenomegaly. There is no tenderness. There is no rebound and no CVA tenderness.  Musculoskeletal: Normal range of motion.  Neurological: She is alert and oriented to person, place, and time. No cranial nerve deficit.  Skin: Skin is warm and dry. No rash noted. No erythema.  Psychiatric: She has a normal mood and affect. Her behavior is normal. Judgment and thought content normal.  Nursing note and vitals reviewed. GU - deferred.  Results for orders placed or performed in visit on 03/19/18 (from the past 72 hour(s))  POCT urinalysis dipstick     Status: None   Collection Time: 03/19/18 11:45 AM  Result Value  Ref Range   Color, UA yellow    Clarity, UA clear    Glucose, UA Negative Negative   Bilirubin, UA negative    Ketones, UA negative    Spec Grav, UA 1.020 1.010 - 1.025   Blood, UA negative    pH, UA 8.0 5.0 - 8.0   Protein, UA Negative Negative   Urobilinogen, UA 0.2 0.2 or 1.0 E.U./dL   Nitrite, UA negative    Leukocytes, UA Negative Negative   Appearance negative    Odor none     Assessment & Plan  1. Urinary frequency - Advised no sign of infection in the UA today, but we will send for culture to be sure - POCT urinalysis dipstick - Urine Culture  2. Overactive bladder - If urine culture is negative, she will contact her urologist to set up an appointment to discuss overactive bladder symptoms that are not being helped by current medication regimen.  3. Dyspareunia in female - Discussed use of lubrication; advised if not improving with this and healthy discussion with her husband regarding frequency of intercourse, we will have her return to discuss additional options to  help her discomfort.   -Red flags and when to present for emergency care or RTC including fever >101.78F, chest pain, shortness of breath, new/worsening/un-resolving symptoms, flank/back pain, nausea/vomiting reviewed with patient at time of visit. Follow up and care instructions discussed and provided in AVS.

## 2018-03-20 LAB — URINE CULTURE
MICRO NUMBER:: 90859730
SPECIMEN QUALITY: ADEQUATE

## 2018-04-15 DIAGNOSIS — M797 Fibromyalgia: Secondary | ICD-10-CM | POA: Diagnosis not present

## 2018-04-15 DIAGNOSIS — M5441 Lumbago with sciatica, right side: Secondary | ICD-10-CM | POA: Diagnosis not present

## 2018-04-15 DIAGNOSIS — M542 Cervicalgia: Secondary | ICD-10-CM | POA: Diagnosis not present

## 2018-04-15 DIAGNOSIS — I639 Cerebral infarction, unspecified: Secondary | ICD-10-CM | POA: Diagnosis not present

## 2018-04-15 DIAGNOSIS — M5442 Lumbago with sciatica, left side: Secondary | ICD-10-CM | POA: Diagnosis not present

## 2018-04-15 DIAGNOSIS — G250 Essential tremor: Secondary | ICD-10-CM | POA: Diagnosis not present

## 2018-04-15 DIAGNOSIS — Z79899 Other long term (current) drug therapy: Secondary | ICD-10-CM | POA: Diagnosis not present

## 2018-04-30 ENCOUNTER — Other Ambulatory Visit: Payer: Self-pay | Admitting: Family Medicine

## 2018-04-30 NOTE — Telephone Encounter (Signed)
Last Ov:6/25

## 2018-05-06 ENCOUNTER — Telehealth: Payer: Self-pay | Admitting: Family Medicine

## 2018-05-06 NOTE — Telephone Encounter (Signed)
Left VM to clarify; does pt want a new medication or is she requesting the Telmisartan be sent to a different pharmacy?

## 2018-05-06 NOTE — Telephone Encounter (Unsigned)
Copied from [place] [phone]. Topic: Quick Communication - See Telephone Encounter >> May 06, 2018  1:32 PM Marja Kays F wrote: Pt called stating that her blood pressure medication telmisartan is on back order and they do not know when it will be available and would like a different  Rx called in to [facility]   Best number  [phone]

## 2018-05-11 ENCOUNTER — Other Ambulatory Visit: Payer: Self-pay | Admitting: Nurse Practitioner

## 2018-05-11 MED ORDER — VALSARTAN 80 MG PO TABS: 80.0000 mg | ORAL_TABLET | Freq: Every day | ORAL | 0 refills | Status: DC

## 2018-05-13 ENCOUNTER — Ambulatory Visit (INDEPENDENT_AMBULATORY_CARE_PROVIDER_SITE_OTHER): Payer: Medicare HMO

## 2018-05-13 VITALS — BP 102/64 | HR 73 | Temp 98.1°F | Ht 68.0 in | Wt 208.3 lb

## 2018-05-13 DIAGNOSIS — Z0000 Encounter for general adult medical examination without abnormal findings: Secondary | ICD-10-CM

## 2018-05-13 DIAGNOSIS — Z23 Encounter for immunization: Secondary | ICD-10-CM | POA: Diagnosis not present

## 2018-05-13 NOTE — Patient Instructions (Signed)
Robin Bryan , Thank you for taking time to come for your Medicare Wellness Visit. I appreciate your ongoing commitment to your health goals. Please review the following plan we discussed and let me know if I can assist you in the future.   Screening recommendations/referrals: Colorectal Screening: Up to date Mammogram: Up to date Bone Density: Please call to schedule your appointment  Vision and Dental Exams: Recommended annual ophthalmology exams for early detection of glaucoma and other disorders of the eye Recommended annual dental exams for proper oral hygiene  Vaccinations: Influenza vaccine: Completed today Pneumococcal vaccine: Up to date Tdap vaccine: Up to date Shingles vaccine: Please call your insurance company to determine your out of pocket expense for the Shingrix vaccine. You may receive this vaccine at your local pharmacy.  Advanced directives: Advance directives discussed with you today. I have provided a copy for you to complete at home and have notarized. Once this is complete please bring a copy in to our office so we can scan it into your chart.  Goals: Recommend to drink at least 6-8 8oz glasses of water per day.  Next appointment: Please schedule your Annual Wellness Visit with your Nurse Health Advisor in one year.  Preventive Care 65 Years and Older, Female Preventive care refers to lifestyle choices and visits with your health care provider that can promote health and wellness. What does preventive care include?  A yearly physical exam. This is also called an annual well check.  Dental exams once or twice a year.  Routine eye exams. Ask your health care provider how often you should have your eyes checked.  Personal lifestyle choices, including:  Daily care of your teeth and gums.  Regular physical activity.  Eating a healthy diet.  Avoiding tobacco and drug use.  Limiting alcohol use.  Practicing safe sex.  Taking low-dose aspirin every  day.  Taking vitamin and mineral supplements as recommended by your health care provider. What happens during an annual well check? The services and screenings done by your health care provider during your annual well check will depend on your age, overall health, lifestyle risk factors, and family history of disease. Counseling  Your health care provider may ask you questions about your:  Alcohol use.  Tobacco use.  Drug use.  Emotional well-being.  Home and relationship well-being.  Sexual activity.  Eating habits.  History of falls.  Memory and ability to understand (cognition).  Work and work Statistician.  Reproductive health. Screening  You may have the following tests or measurements:  Height, weight, and BMI.  Blood pressure.  Lipid and cholesterol levels. These may be checked every 5 years, or more frequently if you are over 51 years old.  Skin check.  Lung cancer screening. You may have this screening every year starting at age 49 if you have a 30-pack-year history of smoking and currently smoke or have quit within the past 15 years.  Fecal occult blood test (FOBT) of the stool. You may have this test every year starting at age 49.  Flexible sigmoidoscopy or colonoscopy. You may have a sigmoidoscopy every 5 years or a colonoscopy every 10 years starting at age 79.  Hepatitis C blood test.  Hepatitis B blood test.  Sexually transmitted disease (STD) testing.  Diabetes screening. This is done by checking your blood sugar (glucose) after you have not eaten for a while (fasting). You may have this done every 1-3 years.  Bone density scan. This is done to screen  for osteoporosis. You may have this done starting at age 58.  Mammogram. This may be done every 1-2 years. Talk to your health care provider about how often you should have regular mammograms. Talk with your health care provider about your test results, treatment options, and if necessary, the need  for more tests. Vaccines  Your health care provider may recommend certain vaccines, such as:  Influenza vaccine. This is recommended every year.  Tetanus, diphtheria, and acellular pertussis (Tdap, Td) vaccine. You may need a Td booster every 10 years.  Zoster vaccine. You may need this after age 79.  Pneumococcal 13-valent conjugate (PCV13) vaccine. One dose is recommended after age 96.  Pneumococcal polysaccharide (PPSV23) vaccine. One dose is recommended after age 46. Talk to your health care provider about which screenings and vaccines you need and how often you need them. This information is not intended to replace advice given to you by your health care provider. Make sure you discuss any questions you have with your health care provider. Document Released: 09/14/2015 Document Revised: 05/07/2016 Document Reviewed: 06/19/2015 Elsevier Interactive Patient Education  2017 [place] Prevention in the Home Falls can cause injuries. They can happen to people of all ages. There are many things you can do to make your home safe and to help prevent falls. What can I do on the outside of my home?  Regularly fix the edges of walkways and driveways and fix any cracks.  Remove anything that might make you trip as you walk through a door, such as a raised step or threshold.  Trim any bushes or trees on the path to your home.  Use bright outdoor lighting.  Clear any walking paths of anything that might make someone trip, such as rocks or tools.  Regularly check to see if handrails are loose or broken. Make sure that both sides of any steps have handrails.  Any raised decks and porches should have guardrails on the edges.  Have any leaves, snow, or ice cleared regularly.  Use sand or salt on walking paths during winter.  Clean up any spills in your garage right away. This includes oil or grease spills. What can I do in the bathroom?  Use night lights.  Install grab bars  by the toilet and in the tub and shower. Do not use towel bars as grab bars.  Use non-skid mats or decals in the tub or shower.  If you need to sit down in the shower, use a plastic, non-slip stool.  Keep the floor dry. Clean up any water that spills on the floor as soon as it happens.  Remove soap buildup in the tub or shower regularly.  Attach bath mats securely with double-sided non-slip rug tape.  Do not have throw rugs and other things on the floor that can make you trip. What can I do in the bedroom?  Use night lights.  Make sure that you have a light by your bed that is easy to reach.  Do not use any sheets or blankets that are too big for your bed. They should not hang down onto the floor.  Have a firm chair that has side arms. You can use this for support while you get dressed.  Do not have throw rugs and other things on the floor that can make you trip. What can I do in the kitchen?  Clean up any spills right away.  Avoid walking on wet floors.  Keep items that you  use a lot in easy-to-reach places.  If you need to reach something above you, use a strong step stool that has a grab bar.  Keep electrical cords out of the way.  Do not use floor polish or wax that makes floors slippery. If you must use wax, use non-skid floor wax.  Do not have throw rugs and other things on the floor that can make you trip. What can I do with my stairs?  Do not leave any items on the stairs.  Make sure that there are handrails on both sides of the stairs and use them. Fix handrails that are broken or loose. Make sure that handrails are as long as the stairways.  Check any carpeting to make sure that it is firmly attached to the stairs. Fix any carpet that is loose or worn.  Avoid having throw rugs at the top or bottom of the stairs. If you do have throw rugs, attach them to the floor with carpet tape.  Make sure that you have a light switch at the top of the stairs and the  bottom of the stairs. If you do not have them, ask someone to add them for you. What else can I do to help prevent falls?  Wear shoes that:  Do not have high heels.  Have rubber bottoms.  Are comfortable and fit you well.  Are closed at the toe. Do not wear sandals.  If you use a stepladder:  Make sure that it is fully opened. Do not climb a closed stepladder.  Make sure that both sides of the stepladder are locked into place.  Ask someone to hold it for you, if possible.  Clearly mark and make sure that you can see:  Any grab bars or handrails.  First and last steps.  Where the edge of each step is.  Use tools that help you move around (mobility aids) if they are needed. These include:  Canes.  Walkers.  Scooters.  Crutches.  Turn on the lights when you go into a dark area. Replace any light bulbs as soon as they burn out.  Set up your furniture so you have a clear path. Avoid moving your furniture around.  If any of your floors are uneven, fix them.  If there are any pets around you, be aware of where they are.  Review your medicines with your doctor. Some medicines can make you feel dizzy. This can increase your chance of falling. Ask your doctor what other things that you can do to help prevent falls. This information is not intended to replace advice given to you by your health care provider. Make sure you discuss any questions you have with your health care provider. Document Released: 06/14/2009 Document Revised: 01/24/2016 Document Reviewed: 09/22/2014 Elsevier Interactive Patient Education  2017 [employer].

## 2018-05-13 NOTE — Progress Notes (Signed)
Subjective:   Robin Bryan is a 67 y.o. female who presents for Medicare Annual (Subsequent) preventive examination.  Review of Systems:   N/A Cardiac Risk Factors include: advanced age (>8men, >73 women);dyslipidemia;hypertension;obesity (BMI >30kg/m2);sedentary lifestyle     Objective:     Vitals: BP 102/64 (BP Location: Left Arm, Patient Position: Sitting, Cuff Size: Large)   Pulse 73   Temp 98.1 F (36.7 C) (Oral)   Ht 5\' 8"  (1.727 m)   Wt 208 lb 4.8 oz (94.5 kg)   SpO2 94%   BMI 31.67 kg/m   Body mass index is 31.67 kg/m.  Advanced Directives 05/13/2018 05/12/2017 04/03/2017 02/26/2017 02/16/2017 01/01/2017 12/23/2016  Does Patient Have a Medical Advance Directive? No No No No No No No  Would patient like information on creating a medical advance directive? Yes (MAU/Ambulatory/Procedural Areas - Information given) - - - - - -    Tobacco Social History   Tobacco Use  Smoking Status Former Smoker  . Packs/day: 0.50  . Years: 8.00  . Pack years: 4.00  . Types: Cigarettes  . Last attempt to quit: 05/27/2011  . Years since quitting: 6.9  Smokeless Tobacco Never Used  Tobacco Comment   smoking cessation materials not required     Counseling given: No Comment: smoking cessation materials not required  Clinical Intake:  Pre-visit preparation completed: Yes  Pain : No/denies pain   BMI - recorded: 31.67 Nutritional Status: BMI > 30  Obese Nutritional Risks: None Diabetes: No  How often do you need to have someone help you when you read instructions, pamphlets, or other written materials from your doctor or pharmacy?: 1 - Never  Interpreter Needed?: No  Information entered by :: AEversole, LPN  Past Medical History:  Diagnosis Date  . Anxiety   . Arthritis   . Arthritis of both feet 11/26/2017   xrays March 2019  . Asthma   . Barrett's esophagus   . Bronchitis   . Calcaneal spur of both feet 11/26/2017   Foot xrays March 2019  . Depression   .  Diverticulosis   . Esophageal stricture   . Fibromyalgia   . GERD (gastroesophageal reflux disease)   . Hx of adenomatous colonic polyps   . Hyperlipidemia   . Hypertension   . Intramural leiomyoma of uterus   . Myocardial infarction ([place])   . Stroke [facility])    Past Surgical History:  Procedure Laterality Date  . ABDOMINAL HYSTERECTOMY    . INCONTINENCE SURGERY    . TUBAL LIGATION    . tumor excision ovaries     Family History  Problem Relation Age of Onset  . Ovarian cancer Sister   . Cancer Sister   . Diabetes Sister   . Heart disease Sister   . Lung cancer Sister   . Colon polyps Mother   . Diabetes Mother   . Parkinson's disease Cousin   . Cancer Father        lung  . Diabetes Daughter   . Colon cancer Neg Hx   . Esophageal cancer Neg Hx   . Pancreatic cancer Neg Hx   . Stomach cancer Neg Hx    Social History   Socioeconomic History  . Marital status: Married    Spouse name: Orpah Greek  . Number of children: 2  . Years of education: some collee  . Highest education level: 12th grade  Occupational History  . Occupation: Retired  Scientific laboratory technician  . Financial resource strain: Not hard  at all  . Food insecurity:    Worry: Never true    Inability: Never true  . Transportation needs:    Medical: No    Non-medical: No  Tobacco Use  . Smoking status: Former Smoker    Packs/day: 0.50    Years: 8.00    Pack years: 4.00    Types: Cigarettes    Last attempt to quit: 05/27/2011    Years since quitting: 6.9  . Smokeless tobacco: Never Used  . Tobacco comment: smoking cessation materials not required  Substance and Sexual Activity  . Alcohol use: No  . Drug use: No  . Sexual activity: Yes    Birth control/protection: Post-menopausal  Lifestyle  . Physical activity:    Days per week: 3 days    Minutes per session: 30 min  . Stress: To some extent  Relationships  . Social connections:    Talks on phone: Patient refused    Gets together: Patient refused     Attends religious service: Patient refused    Active member of club or organization: Patient refused    Attends meetings of clubs or organizations: Patient refused    Relationship status: Married  Other Topics Concern  . Not on file  Social History Narrative  . Not on file    Outpatient Encounter Medications as of 05/13/2018  Medication Sig  . clopidogrel (PLAVIX) 75 MG tablet Take 1 tablet (75 mg total) by mouth daily.  . diphenhydrAMINE (BENADRYL) 25 mg capsule Take 1 capsule (25 mg total) by mouth every 8 (eight) hours as needed.  Marland Kitchen EPINEPHrine 0.3 mg/0.3 mL IJ SOAJ injection Inject 0.3 mLs (0.3 mg total) into the muscle once.  . escitalopram (LEXAPRO) 10 MG tablet Take 1 tablet (10 mg total) by mouth daily.  . fluticasone (FLONASE) 50 MCG/ACT nasal spray Place 2 sprays into both nostrils daily.  Marland Kitchen gabapentin (NEURONTIN) 400 MG capsule Take 800 mg by mouth 4 (four) times daily.   Marland Kitchen HYDROcodone-acetaminophen (NORCO/VICODIN) 5-325 MG tablet Take 1 tablet by mouth every 6 (six) hours as needed for moderate pain.  Marland Kitchen loratadine (CLARITIN) 10 MG tablet Take 1 tablet (10 mg total) by mouth daily. If needed for allergies  . MYRBETRIQ 50 MG TB24 tablet TK 1 T PO QD  . pantoprazole (PROTONIX) 40 MG tablet TAKE 1 TABLET(40 MG) BY MOUTH TWICE DAILY  . primidone (MYSOLINE) 50 MG tablet Take 1 tablet by mouth daily.  . rosuvastatin (CRESTOR) 10 MG tablet Take 1 tablet (10 mg total) by mouth at bedtime.  Marland Kitchen tiZANidine (ZANAFLEX) 2 MG tablet Take 1 tablet (2 mg total) by mouth every 8 (eight) hours as needed for muscle spasms.  Marland Kitchen UNABLE TO FIND APPLY 1 2 GMS TO AFFECTED AREA 3 4 TIMES DAILY AS NEEDED FOR PAIN  THIS IS A COMPOUNDED CREAM 1 PUMP EQUALS 1 GRAM  . valsartan (DIOVAN) 80 MG tablet Take 1 tablet (80 mg total) by mouth daily.  . nitrofurantoin, macrocrystal-monohydrate, (MACROBID) 100 MG capsule Take 1 capsule (100 mg total) by mouth 2 (two) times daily. (Patient not taking: Reported on  03/19/2018)  . solifenacin (VESICARE) 10 MG tablet Take 10 mg by mouth daily.  Marland Kitchen venlafaxine XR (EFFEXOR-XR) 37.5 MG 24 hr capsule Take 1 capsule (37.5 mg total) by mouth daily with breakfast.   No facility-administered encounter medications on file as of 05/13/2018.     Activities of Daily Living In your present state of health, do you have any difficulty performing the  following activities: 05/13/2018 02/25/2018  Hearing? N N  Comment denies hearing aids -  Vision? N N  Comment wears eyeglasses -  Difficulty concentrating or making decisions? N N  Walking or climbing stairs? N N  Dressing or bathing? N N  Doing errands, shopping? N N  Preparing Food and eating ? N -  Comment denies dentures -  Using the Toilet? N -  In the past six months, have you accidently leaked urine? N -  Do you have problems with loss of bowel control? N -  Managing your Medications? N -  Managing your Finances? N -  Housekeeping or managing your Housekeeping? N -  Some recent data might be hidden    Patient Care Team: Lada, Satira Anis, MD as PCP - General (Family Medicine) Dorothyann Gibbs, NP as Nurse Practitioner (Gynecologic Oncology) Irine Seal, MD as Consulting Physician (Urology)    Assessment:   This is a routine wellness examination for Gean.  Exercise Activities and Dietary recommendations Current Exercise Habits: Home exercise routine, Type of exercise: walking, Time (Minutes): 30, Frequency (Times/Week): 3, Weekly Exercise (Minutes/Week): 90, Intensity: Mild, Exercise limited by: None identified  Goals    . DIET - INCREASE WATER INTAKE     Recommend to drink at least 6-8 8oz glasses of water per day.       Fall Risk Fall Risk  05/13/2018 03/19/2018 02/25/2018 12/09/2017 11/26/2017  Falls in the past year? No No No No No  Number falls in past yr: - - - - -  Comment - - - - -  Injury with Fall? - - - - -  Comment - - - - -  Risk Factor Category  - - - - -  Risk for fall due to :  Impaired vision;History of fall(s);Impaired balance/gait;Medication side effect - - - -  Risk for fall due to: Comment wears eyeglasses; unsteady gait led to fall in past - - - -  Follow up - - - - -   Green Level: Is your home free of loose throw rugs in walkways, pet beds, electrical cords, etc? Yes Is there adequate lighting in your home to reduce risk of falls?  Yes Are there stairs in or around your home WITH handrails? Yes  ASSISTIVE DEVICES UTILIZED TO PREVENT FALLS: Use of a cane, walker or w/c? No Grab bars in the bathroom? No  Shower chair or a place to sit while bathing? Yes An elevated toilet seat or a handicapped toilet? Yes  Timed Get Up and Go Performed: Yes. Pt ambulated 10 feet within 8 sec. Gait stead-fast and without the use of an assistive device. No intervention required at this time. Fall risk prevention has been discussed.  Community Resource Referral:  Pt declined my offer to send [employer] Referral to Care Guide for installation of grab bars in the shower.  Depression Screen PHQ 2/9 Scores 05/13/2018 03/19/2018 02/25/2018 12/09/2017  PHQ - 2 Score 4 0 0 0  PHQ- 9 Score 8 0 0 -     Cognitive Function     6CIT Screen 05/13/2018  What Year? 0 points  What month? 0 points  What time? 0 points  Count back from 20 0 points  Months in reverse 0 points  Repeat phrase 2 points  Total Score 2    Immunization History  Administered Date(s) Administered  . Influenza, High Dose Seasonal PF 05/13/2018  . Influenza,inj,Quad PF,6+ Mos 07/13/2015  . Influenza-Unspecified  06/10/2017  . Pneumococcal Conjugate-13 07/06/2017  . Tdap 04/03/2017    Qualifies for Shingles Vaccine? Yes. Due for Shingrix. Education has been provided regarding the importance of this vaccine. Pt has been advised to call insurance company to determine out of pocket expense. Advised may also receive vaccine at local pharmacy or Health Dept. Verbalized  acceptance and understanding.  Screening Tests Health Maintenance  Topic Date Due  . DEXA SCAN  10/14/1950  . HIV Screening  11/05/2018 (Originally 10/13/1967)  . PNA vac Low Risk Adult (2 of 2 - PPSV23) 07/06/2018  . MAMMOGRAM  12/29/2018  . COLONOSCOPY  03/18/2022  . TETANUS/TDAP  04/04/2027  . INFLUENZA VACCINE  Completed  . Hepatitis C Screening  Completed  . PAP SMEAR  Discontinued    Cancer Screenings: Lung: Low Dose CT Chest recommended if Age 44-80 years, 30 pack-year currently smoking OR have quit w/in 15years. Patient does not qualify. Breast:  Up to date on Mammogram? Yes. Completed 12/28/17. Repeat every year.   Up to date of Bone Density/Dexa? No. Ordered on 01/08/18. Not yet completed. Provided with contact info and advised to schedule appt. Colorectal: Completed 03/18/17. Repeat every 5 years  Additional Screenings: Hepatitis C Screening: Completed 04/03/17    Plan:  I have personally reviewed and addressed the Medicare Annual Wellness questionnaire and have noted the following in the patient's chart:  A. Medical and social history B. Use of alcohol, tobacco or illicit drugs  C. Current medications and supplements D. Functional ability and status E.  Nutritional status F.  Physical activity G. Advance directives H. List of other physicians I.  Hospitalizations, surgeries, and ER visits in previous 12 months J.  [place] such as hearing and vision if needed, cognitive and depression L. Referrals and appointments  In addition, I have reviewed and discussed with patient certain preventive protocols, quality metrics, and best practice recommendations. A written personalized care plan for preventive services as well as general preventive health recommendations were provided to patient.  See attached scanned questionnaire for additional information.   Signed,  Aleatha Borer, LPN Nurse Health Advisor

## 2018-05-17 IMAGING — US US ABDOMEN COMPLETE
1 series · 13 of 25 positions shown · non-contrast
Comparison: Abdominal MRI of 10/22/2006

CLINICAL DATA: Right upper quadrant pain for 4 months.

EXAM:
ABDOMEN ULTRASOUND COMPLETE

[Series 1: us abdomen complete · 0.25mm/px · 13 of 117 slices shown]
[im 1/117]
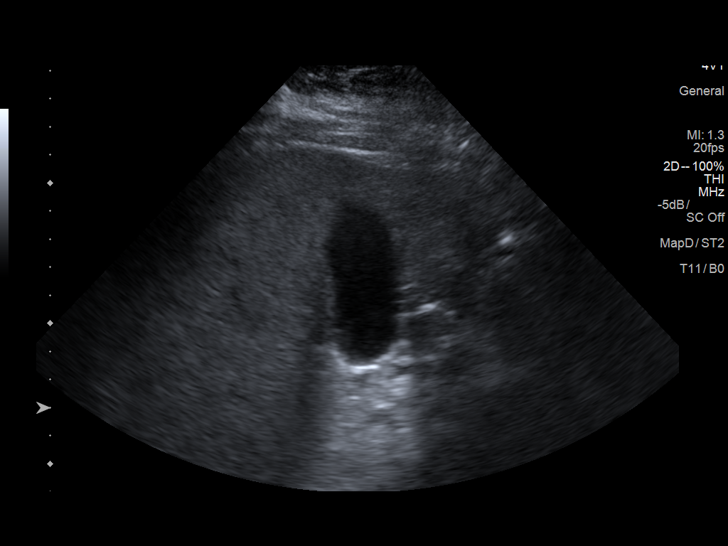
[im 10/117]
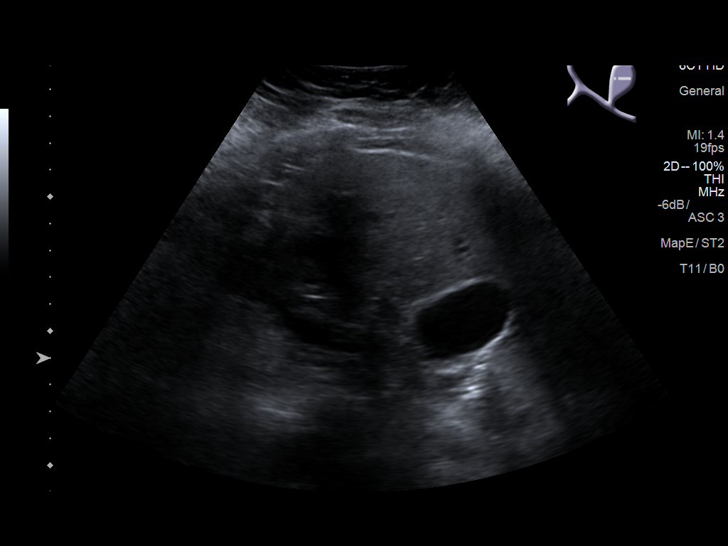
[im 20/117]
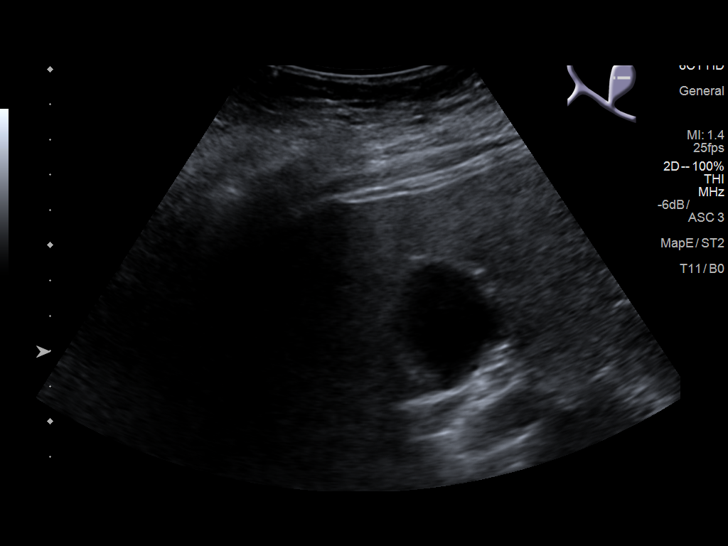
[im 30/117]
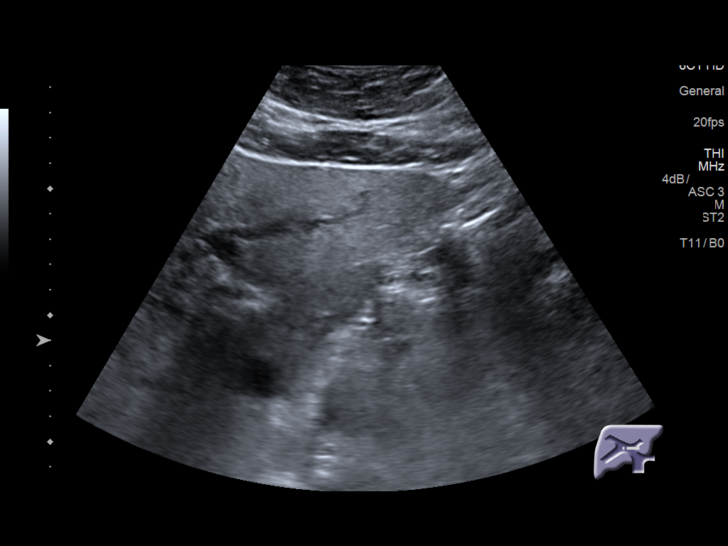
[im 39/117]
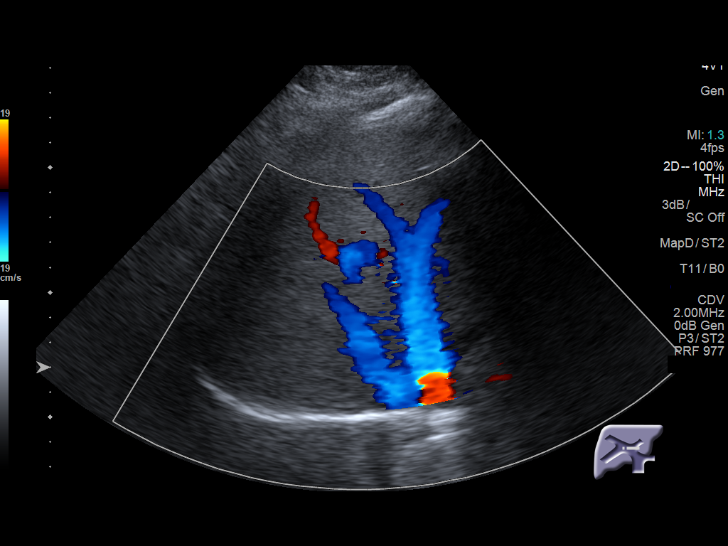
[im 49/117]
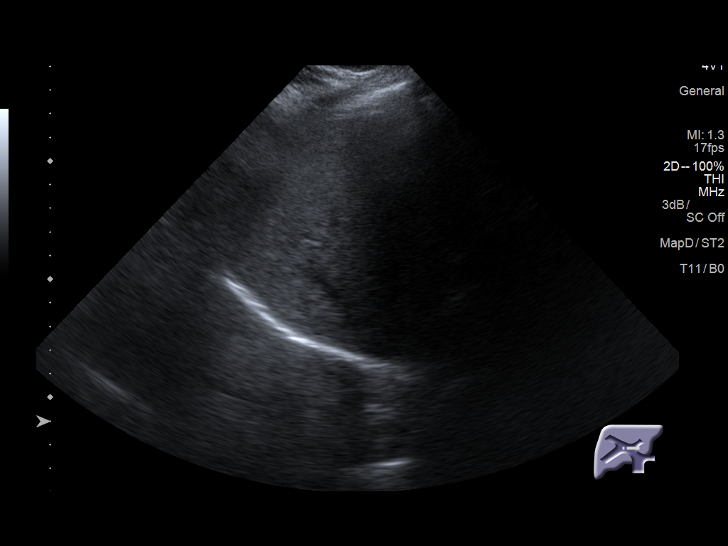
[im 59/117]
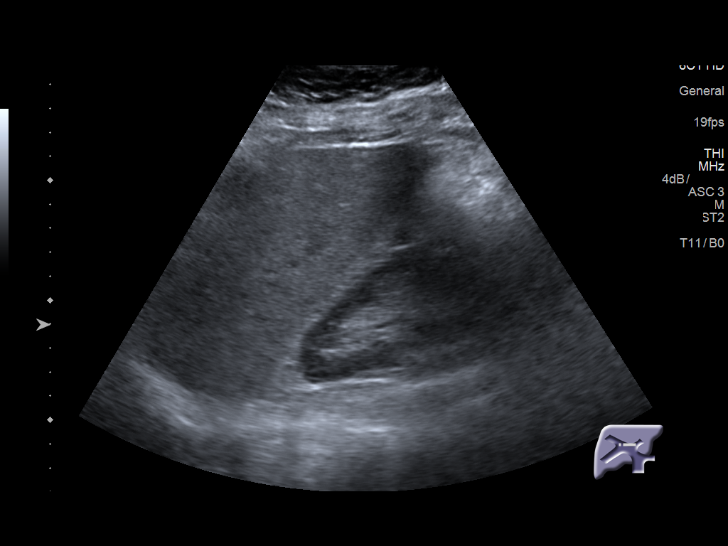
[im 68/117]
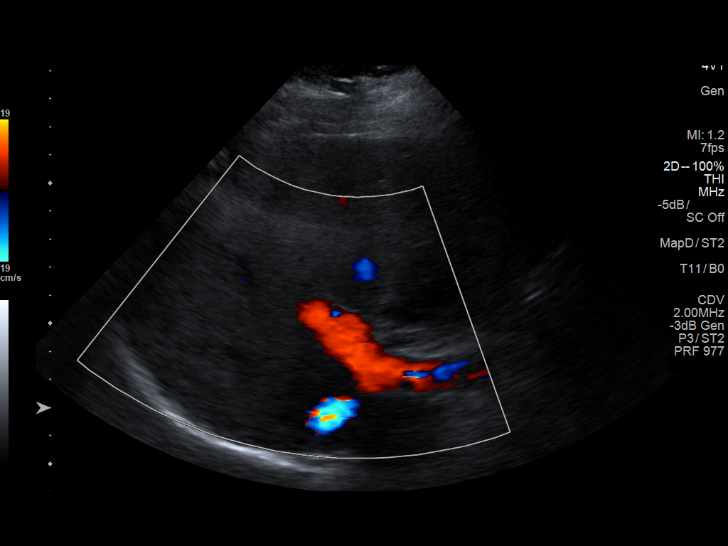
[im 78/117]
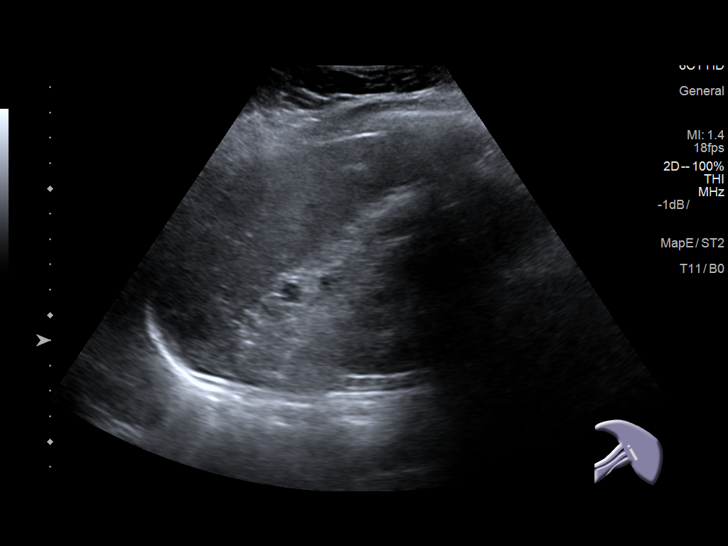
[im 88/117]
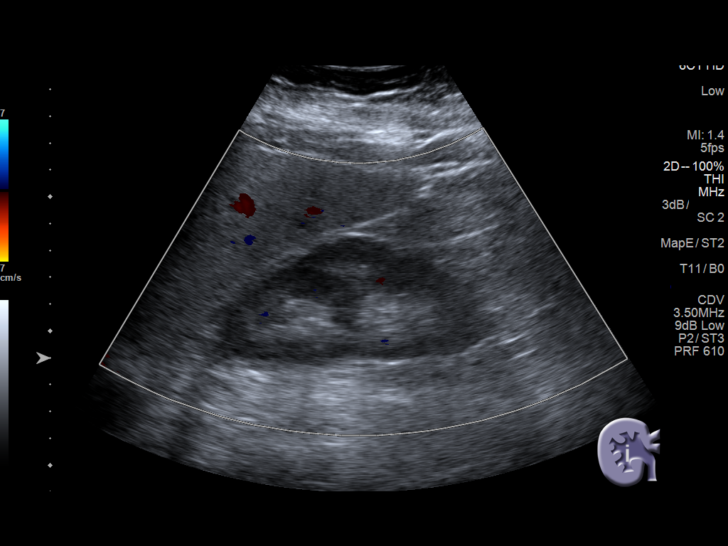
[im 97/117]
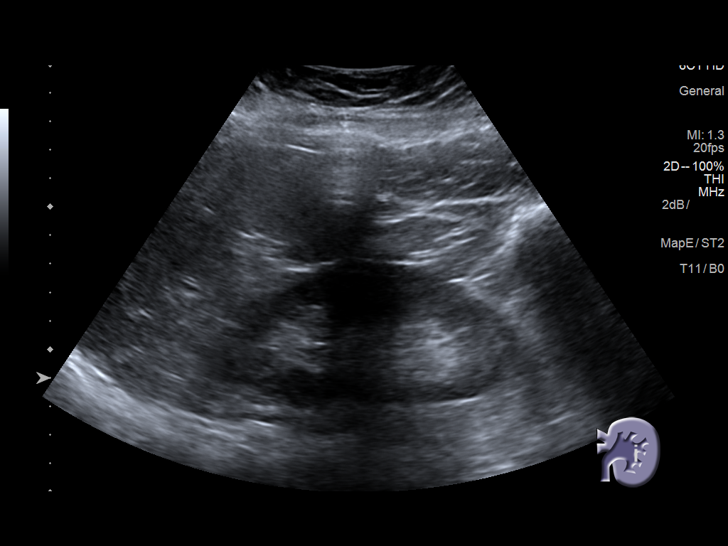
[im 107/117]
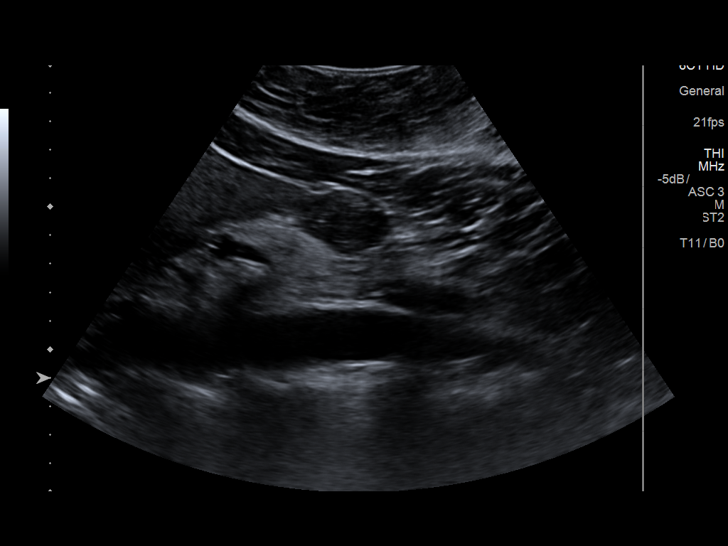
[im 117/117]
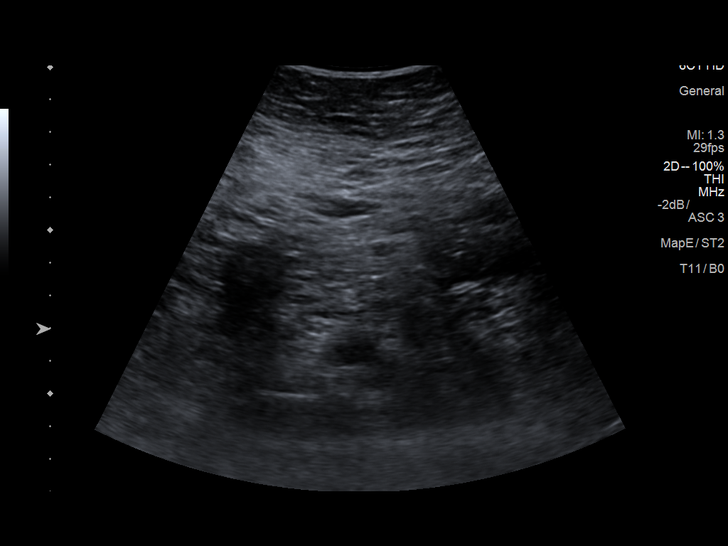

[13 of 25 positions shown; findings below may reference images not displayed]

FINDINGS: Gallbladder: No gallstones or wall thickening visualized. No
sonographic Murphy sign noted by sonographer.

Common bile duct: Diameter: Normal, 5 mm.

Liver: Hyperechoic subcapsular right hepatic lobe lesion measures
2.3 x 2.4 x 2.7 cm. This likely corresponds to the 2.2 x 2.0 cm
hemangioma on 10/22/2006 MRI. No cirrhosis. Portal vein is patent on
color Doppler imaging with normal direction of blood flow towards
the liver.

IVC: No abnormality visualized.

Pancreas: Visualized portion unremarkable.

Spleen: Size and appearance within normal limits.

Right Kidney: Length: 11.0 cm. Echogenicity within normal limits. No
mass or hydronephrosis visualized.

Left Kidney: Length: 10.8 cm. Echogenicity within normal limits. No
mass or hydronephrosis visualized.

Abdominal aorta: No aneurysm visualized.

Other findings: No ascites.
IMPRESSION: No acute process or explanation for right upper quadrant pain.

Hyperechoic right liver lobe lesion likely corresponds to the
hemangioma on 10/22/2006 MRI. Recommend ultrasound follow-up at 3
months to confirm size stability.

## 2018-06-02 DIAGNOSIS — R69 Illness, unspecified: Secondary | ICD-10-CM | POA: Diagnosis not present

## 2018-06-07 ENCOUNTER — Other Ambulatory Visit: Payer: Self-pay | Admitting: Family Medicine

## 2018-06-07 DIAGNOSIS — E782 Mixed hyperlipidemia: Secondary | ICD-10-CM

## 2018-06-23 DIAGNOSIS — I6522 Occlusion and stenosis of left carotid artery: Secondary | ICD-10-CM | POA: Diagnosis not present

## 2018-06-23 DIAGNOSIS — G250 Essential tremor: Secondary | ICD-10-CM | POA: Diagnosis not present

## 2018-06-23 DIAGNOSIS — M797 Fibromyalgia: Secondary | ICD-10-CM | POA: Diagnosis not present

## 2018-06-23 DIAGNOSIS — M5417 Radiculopathy, lumbosacral region: Secondary | ICD-10-CM | POA: Diagnosis not present

## 2018-06-23 DIAGNOSIS — M5412 Radiculopathy, cervical region: Secondary | ICD-10-CM | POA: Diagnosis not present

## 2018-07-06 ENCOUNTER — Encounter: Payer: Self-pay | Admitting: Nurse Practitioner

## 2018-07-06 ENCOUNTER — Ambulatory Visit (INDEPENDENT_AMBULATORY_CARE_PROVIDER_SITE_OTHER): Payer: Medicare HMO | Admitting: Nurse Practitioner

## 2018-07-06 VITALS — BP 110/74 | HR 98 | Temp 98.2°F | Resp 16 | Ht 68.0 in | Wt 221.3 lb

## 2018-07-06 DIAGNOSIS — R059 Cough, unspecified: Secondary | ICD-10-CM

## 2018-07-06 DIAGNOSIS — R05 Cough: Secondary | ICD-10-CM

## 2018-07-06 DIAGNOSIS — R0981 Nasal congestion: Secondary | ICD-10-CM | POA: Diagnosis not present

## 2018-07-06 DIAGNOSIS — H6982 Other specified disorders of Eustachian tube, left ear: Secondary | ICD-10-CM

## 2018-07-06 MED ORDER — BENZONATATE 100 MG PO CAPS: 200.0000 mg | ORAL_CAPSULE | Freq: Three times a day (TID) | ORAL | 0 refills | Status: DC | PRN

## 2018-07-06 NOTE — Progress Notes (Signed)
Name: Robin Bryan   MRN: [id number]    DOB: [birth year]   Date:07/06/2018       Progress Note  Subjective  Chief Complaint  Chief Complaint  Patient presents with  . Headache  . Sore Throat  . Nasal Congestion  . Cough    productive cough with green mucous.    HPI  Patient states 3 days ago has body aches, sore throat, hacking cough, ear pain, headaches; had hoarse voice, sore throat resolves. Ear sounds muffled. Has fibromyalgia not sure if that is where body aches is coming from   hasnt tried anything for relief because she take oxycodone and was not sure about interactions.  Denies nausea, vomiting.   Patient Active Problem List   Diagnosis Date Noted  . Overactive bladder 03/19/2018  . Obesity (BMI 30.0-34.9) 02/25/2018  . Estrogen deficiency 01/08/2018  . Postmenopausal 01/07/2018  . Back pain 11/26/2017  . Chronic kidney disease, stage III (moderate) ([place]) 11/26/2017  . Calcaneal spur of both feet 11/26/2017  . Arthritis of both feet 11/26/2017  . Acute left-sided thoracic back pain 10/30/2015  . Temporary cerebral vascular dysfunction 07/16/2015  . Adaptation reaction 07/16/2015  . Foot pain 07/16/2015  . BP (high blood pressure) 07/16/2015  . Acid reflux 07/16/2015  . Abnormal LFTs 07/16/2015  . Pericardial effusion 07/13/2015  . Mass of chest wall, left 06/29/2015  . Lipoma 06/29/2015  . Pure hypercholesterolemia 04/28/2015  . Gastro-esophageal reflux disease without esophagitis 04/28/2015  . Benign essential HTN 04/28/2015  . Hyperlipidemia 03/19/2015  . Elevated hemoglobin A1c 03/19/2015  . Neck muscle spasm 03/19/2015  . Cognitive change 02/07/2015  . Persistent dry cough 02/07/2015  . Major depression in partial remission ([place]) 02/07/2015  . Fibrositis 02/07/2015  . Anxiety disorder 02/07/2015  . History of anaphylaxis 02/07/2015  . Pubic bone pain 10/02/2014  . Mass of pelvis 06/05/2014  . Ovarian cyst, left 05/26/2014  . Abdominal pain, chronic,  left lower quadrant 05/26/2014  . CEREBROVASCULAR DISEASE 04/23/2010  . COLONIC POLYPS, ADENOMATOUS 12/09/2006  . ESOPHAGEAL STRICTURE 12/09/2006  . GERD 12/09/2006  . BARRETTS ESOPHAGUS 12/09/2006  . DIVERTICULOSIS, COLON 12/09/2006    Past Medical History:  Diagnosis Date  . Anxiety   . Arthritis   . Arthritis of both feet 11/26/2017   xrays March 2019  . Asthma   . Barrett's esophagus   . Bronchitis   . Calcaneal spur of both feet 11/26/2017   Foot xrays March 2019  . Depression   . Diverticulosis   . Esophageal stricture   . Fibromyalgia   . GERD (gastroesophageal reflux disease)   . Hx of adenomatous colonic polyps   . Hyperlipidemia   . Hypertension   . Intramural leiomyoma of uterus   . Myocardial infarction ([place])   . Stroke [facility])     Past Surgical History:  Procedure Laterality Date  . ABDOMINAL HYSTERECTOMY    . INCONTINENCE SURGERY    . TUBAL LIGATION    . tumor excision ovaries      Social History   Tobacco Use  . Smoking status: Former Smoker    Packs/day: 0.50    Years: 8.00    Pack years: 4.00    Types: Cigarettes    Last attempt to quit: 05/27/2011    Years since quitting: 7.1  . Smokeless tobacco: Never Used  . Tobacco comment: smoking cessation materials not required  Substance Use Topics  . Alcohol use: No     Current Outpatient Medications:  .  clopidogrel (PLAVIX) 75 MG tablet, Take 1 tablet (75 mg total) by mouth daily., Disp: 90 tablet, Rfl: 3 .  diphenhydrAMINE (BENADRYL) 25 mg capsule, Take 1 capsule (25 mg total) by mouth every 8 (eight) hours as needed., Disp: 30 capsule, Rfl: 0 .  EPINEPHrine 0.3 mg/0.3 mL IJ SOAJ injection, Inject 0.3 mLs (0.3 mg total) into the muscle once., Disp: 1 Device, Rfl: 0 .  escitalopram (LEXAPRO) 10 MG tablet, Take 1 tablet (10 mg total) by mouth daily., Disp: 90 tablet, Rfl: 1 .  fluticasone (FLONASE) 50 MCG/ACT nasal spray, Place 2 sprays into both nostrils daily., Disp: 16 g, Rfl: 6 .  gabapentin  (NEURONTIN) 400 MG capsule, Take 800 mg by mouth 4 (four) times daily. , Disp: , Rfl:  .  loratadine (CLARITIN) 10 MG tablet, Take 1 tablet (10 mg total) by mouth daily. If needed for allergies, Disp: 30 tablet, Rfl: 11 .  MYRBETRIQ 50 MG TB24 tablet, TK 1 T PO QD, Disp: , Rfl: 3 .  pantoprazole (PROTONIX) 40 MG tablet, TAKE 1 TABLET(40 MG) BY MOUTH TWICE DAILY, Disp: 180 tablet, Rfl: 3 .  primidone (MYSOLINE) 50 MG tablet, Take 1 tablet by mouth daily., Disp: , Rfl: 6 .  rosuvastatin (CRESTOR) 10 MG tablet, TAKE 1 TABLET BY MOUTH EVERYDAY AT BEDTIME, Disp: 90 tablet, Rfl: 0 .  solifenacin (VESICARE) 10 MG tablet, Take 10 mg by mouth daily., Disp: , Rfl:  .  tiZANidine (ZANAFLEX) 2 MG tablet, Take 1 tablet (2 mg total) by mouth every 8 (eight) hours as needed for muscle spasms., Disp: 30 tablet, Rfl: 0 .  UNABLE TO FIND, APPLY 1 2 GMS TO AFFECTED AREA 3 4 TIMES DAILY AS NEEDED FOR PAIN  THIS IS A COMPOUNDED CREAM 1 PUMP EQUALS 1 GRAM, Disp: , Rfl: 5 .  valsartan (DIOVAN) 80 MG tablet, Take 1 tablet (80 mg total) by mouth daily., Disp: 90 tablet, Rfl: 0 .  HYDROcodone-acetaminophen (NORCO/VICODIN) 5-325 MG tablet, Take 1 tablet by mouth every 6 (six) hours as needed for moderate pain. (Patient not taking: Reported on 07/06/2018), Disp: , Rfl:  .  nitrofurantoin, macrocrystal-monohydrate, (MACROBID) 100 MG capsule, Take 1 capsule (100 mg total) by mouth 2 (two) times daily. (Patient not taking: Reported on 07/06/2018), Disp: 14 capsule, Rfl: 0 .  venlafaxine XR (EFFEXOR-XR) 37.5 MG 24 hr capsule, Take 1 capsule (37.5 mg total) by mouth daily with breakfast., Disp: 5 capsule, Rfl: 0  Allergies  Allergen Reactions  . Shellfish Allergy Swelling  . Vicodin [Hydrocodone-Acetaminophen] Nausea Only    ROS   No other specific complaints in a complete review of systems (except as listed in HPI above).  Objective  Vitals:   07/06/18 1011  BP: 110/74  Pulse: 98  Resp: 16  Temp: 98.2 F (36.8 C)   TempSrc: Oral  SpO2: 99%  Weight: 221 lb 4.8 oz (100.4 kg)  Height: 5\' 8"  (1.727 m)    Body mass index is 33.65 kg/m.  Nursing Note and Vital Signs reviewed.  Physical Exam  Constitutional: She is oriented to person, place, and time. She appears well-developed and well-nourished.  HENT:  Head: Normocephalic and atraumatic.  Right Ear: Hearing and ear canal normal. No middle ear effusion.  Left Ear: Hearing and ear canal normal. A middle ear effusion is present.  Mouth/Throat: Uvula is midline, oropharynx is clear and moist and mucous membranes are normal. No oropharyngeal exudate. No tonsillar exudate.  Eyes: Pupils are equal, round, and reactive to light.  EOM are normal.  Neck: Normal range of motion. Neck supple.  Cardiovascular: Normal rate, regular rhythm, normal heart sounds and intact distal pulses.  Pulmonary/Chest: Effort normal and breath sounds normal. She has no wheezes. She exhibits no tenderness.  Lymphadenopathy:    She has no cervical adenopathy.  Neurological: She is alert and oriented to person, place, and time.  Skin: Skin is warm and dry. No rash noted.  Psychiatric: She has a normal mood and affect. Her behavior is normal.     No results found for this or any previous visit (from the past 48 hour(s)).  Assessment & Plan  1. Cough - benzonatate (TESSALON PERLES) 100 MG capsule; Take 2 capsules (200 mg total) by mouth 3 (three) times daily as needed for cough.  Dispense: 30 capsule; Refill: 0  2. Dysfunction of left eustachian tube Flonase, antihistamine   3. Nasal congestion Flonase  - discussed OTC treatments; call back in unimproved in 3 days for delayed antibiotic prescribing

## 2018-07-06 NOTE — Patient Instructions (Addendum)
-   take guaifenesin twice daily - take loratadine 5mg  daily - Take flonase daily  - Take cough medicine prescribed  - May feel worse days 3-5 but then should start improving daily after that. - If feeling the same or worse in 3 days please call and tell us - If having fevers, shortness of breath, chest pain, sudden worsening in symptoms please get urgent care or call us immediately.   You likely have a viral upper respiratory infection (URI). Antibiotics will not reduce the number of days you are ill or prevent you from getting bacterial rhinosinusitis. A URI can take up to 14 days to resolve, but typically last between 7-11 days. Your body is so smart and strong that it will be fighting this illness off for you but it is important that you drink plenty of fluids, rest. Cover your nose/mouth when you cough or sneeze and wash your hands well and often. Here are some helpful things you can use or pick up over the counter from the pharmacy to help with your symptoms:   For Fever/Pain: Acetaminophen every 6 hours as needed (maximum of 3000mg  a day).  For coughing: try dextromethorphan for a cough suppressant, and/or a cool mist humidifier, lozenges  For sore throat: saline gargles, honey herbal tea, lozenges, throat spray  To dry out your nose: try an antihistamine like loratadine (non-sedating) or diphenhydramine (sedating) or others To relieve a stuffy nose: try an oral decongestant  Like pseudoephedrine if you are under the age of 37 and do not have high blood pressure, neti pot To make blowing your nose easier: guaifenesin

## 2018-07-12 ENCOUNTER — Telehealth: Payer: Self-pay | Admitting: Family Medicine

## 2018-07-12 NOTE — Telephone Encounter (Signed)
Copied from [place] [id number]. Topic: Quick Communication - Rx Refill/Question >> Jul 12, 2018  2:45 PM Scherrie Gerlach wrote: Medication: another abx Pt saw Benjamine Mola 11/05 and was to let the dr know if she was not any better by the weekend.  Pt states she is not, coughing up green balls of phlegm.  Pt has finished all the other meds, requesting something else call into pharmacy [id number] - [place], [place] [phone] (Phone) [phone] (Fax)

## 2018-07-13 MED ORDER — AMOXICILLIN-POT CLAVULANATE 875-125 MG PO TABS: 1.0000 | ORAL_TABLET | Freq: Two times a day (BID) | ORAL | 0 refills | Status: AC

## 2018-07-13 NOTE — Telephone Encounter (Signed)
[facility]in sent to [facility]

## 2018-07-13 NOTE — Telephone Encounter (Signed)
Please advise 

## 2018-07-14 NOTE — Telephone Encounter (Signed)
Patient called.  Patient aware.  

## 2018-07-22 DIAGNOSIS — R51 Headache: Secondary | ICD-10-CM | POA: Diagnosis not present

## 2018-07-22 DIAGNOSIS — M542 Cervicalgia: Secondary | ICD-10-CM | POA: Diagnosis not present

## 2018-07-22 DIAGNOSIS — M5442 Lumbago with sciatica, left side: Secondary | ICD-10-CM | POA: Diagnosis not present

## 2018-07-22 DIAGNOSIS — G250 Essential tremor: Secondary | ICD-10-CM | POA: Diagnosis not present

## 2018-07-22 DIAGNOSIS — G5603 Carpal tunnel syndrome, bilateral upper limbs: Secondary | ICD-10-CM | POA: Diagnosis not present

## 2018-07-22 DIAGNOSIS — M5441 Lumbago with sciatica, right side: Secondary | ICD-10-CM | POA: Diagnosis not present

## 2018-07-22 DIAGNOSIS — R201 Hypoesthesia of skin: Secondary | ICD-10-CM | POA: Diagnosis not present

## 2018-07-26 ENCOUNTER — Other Ambulatory Visit: Payer: Self-pay | Admitting: Nurse Practitioner

## 2018-08-26 ENCOUNTER — Telehealth: Payer: Self-pay | Admitting: Family Medicine

## 2018-08-26 NOTE — Telephone Encounter (Signed)
Pt.notified

## 2018-08-26 NOTE — Telephone Encounter (Signed)
Please remind patient that she is overdue for her DEXA scan Let her know how she can schedule that I've extended the orders

## 2018-08-29 ENCOUNTER — Telehealth: Payer: Self-pay | Admitting: Family Medicine

## 2018-08-29 DIAGNOSIS — R7303 Prediabetes: Secondary | ICD-10-CM

## 2018-08-29 DIAGNOSIS — E785 Hyperlipidemia, unspecified: Secondary | ICD-10-CM

## 2018-08-29 DIAGNOSIS — Z5181 Encounter for therapeutic drug level monitoring: Secondary | ICD-10-CM

## 2018-08-29 DIAGNOSIS — E782 Mixed hyperlipidemia: Secondary | ICD-10-CM

## 2018-08-30 ENCOUNTER — Ambulatory Visit: Payer: Medicare HMO

## 2018-08-30 NOTE — Telephone Encounter (Signed)
Patient will come in today for labs at 3pm, transferred patient to [facility] to schedule ultrasound appointment and patient scheduled follow up with PCP for 09/10/18. Patient also scheduled a nurse visit for today at 3pm for second pneumonia injection.

## 2018-08-30 NOTE — Telephone Encounter (Signed)
Patient needs an appt with me asap She was due for f/u She needs labs; please ORDER lipid panel, CMP, A1c Dx hyperlipidemia, medication montoring, prediabetes  She needs RUQ [place]; please get that [place] scheduled so hopefully we'll have it to discuss at her appointment with me

## 2018-08-30 NOTE — Telephone Encounter (Signed)
Labs ordered left detailed voicemail

## 2018-08-31 ENCOUNTER — Ambulatory Visit (INDEPENDENT_AMBULATORY_CARE_PROVIDER_SITE_OTHER): Payer: Medicare HMO

## 2018-08-31 DIAGNOSIS — Z5181 Encounter for therapeutic drug level monitoring: Secondary | ICD-10-CM

## 2018-08-31 DIAGNOSIS — Z23 Encounter for immunization: Secondary | ICD-10-CM | POA: Diagnosis not present

## 2018-08-31 DIAGNOSIS — R7303 Prediabetes: Secondary | ICD-10-CM | POA: Diagnosis not present

## 2018-08-31 DIAGNOSIS — E785 Hyperlipidemia, unspecified: Secondary | ICD-10-CM | POA: Diagnosis not present

## 2018-08-31 NOTE — Telephone Encounter (Signed)
Done

## 2018-09-01 LAB — COMPLETE METABOLIC PANEL WITH GFR
AG Ratio: 2 (calc) (ref 1.0–2.5)
ALT: 14 U/L (ref 6–29)
AST: 10 U/L (ref 10–35)
Albumin: 4.3 g/dL (ref 3.6–5.1)
Alkaline phosphatase (APISO): 74 U/L (ref 33–130)
BUN: 18 mg/dL (ref 7–25)
CO2: 29 mmol/L (ref 20–32)
Calcium: 9.3 mg/dL (ref 8.6–10.4)
Chloride: 105 mmol/L (ref 98–110)
Creat: 0.78 mg/dL (ref 0.50–0.99)
GFR, Est African American: 92 mL/min/{1.73_m2} (ref >=60)
GFR, Est Non African American: 80 mL/min/{1.73_m2} (ref >=60)
Globulin: 2.2 g/dL (calc) (ref 1.9–3.7)
Glucose, Bld: 109 mg/dL — ABNORMAL HIGH (ref 65–99)
Potassium: 4.5 mmol/L (ref 3.5–5.3)
Sodium: 141 mmol/L (ref 135–146)
Total Bilirubin: 0.4 mg/dL (ref 0.2–1.2)
Total Protein: 6.5 g/dL (ref 6.1–8.1)

## 2018-09-01 LAB — LIPID PANEL
CHOL/HDL RATIO: 3.6 (calc) (ref <5.0)
Cholesterol: 134 mg/dL (ref <200)
HDL: 37 mg/dL — ABNORMAL LOW (ref >50)
LDL CHOLESTEROL (CALC): 71 mg/dL
Non-HDL Cholesterol (Calc): 97 mg/dL (calc) (ref <130)
TRIGLYCERIDES: 181 mg/dL — AB (ref <150)

## 2018-09-01 LAB — HEMOGLOBIN A1C
Hgb A1c MFr Bld: 5.7 % of total Hgb — ABNORMAL HIGH (ref <5.7)
Mean Plasma Glucose: 117 (calc)
eAG (mmol/L): 6.5 (calc)

## 2018-09-02 ENCOUNTER — Ambulatory Visit
Admission: RE | Admit: 2018-09-02 | Discharge: 2018-09-02 | Disposition: A | Discharge status: Home / Self Care | Payer: Medicare HMO | Source: Ambulatory Visit | Attending: Family Medicine | Admitting: Family Medicine

## 2018-09-02 DIAGNOSIS — K769 Liver disease, unspecified: Secondary | ICD-10-CM | POA: Insufficient documentation

## 2018-09-08 ENCOUNTER — Encounter: Payer: Self-pay | Admitting: Family Medicine

## 2018-09-08 ENCOUNTER — Other Ambulatory Visit: Payer: Self-pay | Admitting: Internal Medicine

## 2018-09-08 DIAGNOSIS — K760 Fatty (change of) liver, not elsewhere classified: Secondary | ICD-10-CM

## 2018-09-08 DIAGNOSIS — D1803 Hemangioma of intra-abdominal structures: Secondary | ICD-10-CM

## 2018-09-08 HISTORY — DX: Fatty (change of) liver, not elsewhere classified: K76.0

## 2018-09-08 HISTORY — DX: Hemangioma of intra-abdominal structures: D18.03

## 2018-09-10 ENCOUNTER — Encounter: Payer: Self-pay | Admitting: Family Medicine

## 2018-09-10 ENCOUNTER — Ambulatory Visit (INDEPENDENT_AMBULATORY_CARE_PROVIDER_SITE_OTHER): Payer: Medicare HMO | Admitting: Family Medicine

## 2018-09-10 VITALS — BP 118/64 | HR 87 | Temp 98.0°F | Ht 68.0 in | Wt 226.9 lb

## 2018-09-10 DIAGNOSIS — R42 Dizziness and giddiness: Secondary | ICD-10-CM | POA: Diagnosis not present

## 2018-09-10 DIAGNOSIS — R251 Tremor, unspecified: Secondary | ICD-10-CM

## 2018-09-10 DIAGNOSIS — M546 Pain in thoracic spine: Secondary | ICD-10-CM

## 2018-09-10 DIAGNOSIS — E2839 Other primary ovarian failure: Secondary | ICD-10-CM | POA: Diagnosis not present

## 2018-09-10 NOTE — Patient Instructions (Addendum)
Check out the information at familydoctor.org entitled "Nutrition for Weight Loss: What You Need to Know about Fad Diets" Try to lose between 1-2 pounds per week by taking in fewer calories and burning off more calories You can succeed by limiting portions, limiting foods dense in calories and fat, becoming more active, and drinking 8 glasses of water a day (64 ounces) Don't skip meals, especially breakfast, as skipping meals may alter your metabolism Do not use over-the-counter weight loss pills or gimmicks that claim rapid weight loss A healthy BMI (or body mass index) is between 18.5 and 24.9 You can calculate your ideal BMI at the [employer] website ClubMonetize.fr  Check with your insurance and see if they will cover for you to see a nutritionist  Try to limit saturated fats in your diet (bologna, hot dogs, barbeque, cheeseburgers, hamburgers, steak, bacon, sausage, cheese, etc.) and get more fresh fruits, vegetables, and whole grains  Have xrays done across the street  If you have future episodes that are concerning, please get checked out right away    Obesity, Adult Obesity is the condition of having too much total body fat. Being overweight or obese means that your weight is greater than what is considered healthy for your body size. Obesity is determined by a measurement called BMI. BMI is an estimate of body fat and is calculated from height and weight. For adults, a BMI of 30 or higher is considered obese. Obesity can eventually lead to other health concerns and major illnesses, including:  Stroke.  Coronary artery disease (CAD).  Type 2 diabetes.  Some types of cancer, including cancers of the colon, breast, uterus, and gallbladder.  Osteoarthritis.  High blood pressure (hypertension).  High cholesterol.  Sleep apnea.  Gallbladder stones.  Infertility problems. What are the causes? The main cause of obesity is taking  in (consuming) more calories than your body uses for energy. Other factors that contribute to this condition may include:  Being born with genes that make you more likely to become obese.  Having a medical condition that causes obesity. These conditions include: ? Hypothyroidism. ? Polycystic ovarian syndrome (PCOS). ? Binge-eating disorder. ? Cushing syndrome.  Taking certain medicines, such as steroids, antidepressants, and seizure medicines.  Not being physically active (sedentary lifestyle).  Living where there are limited places to exercise safely or buy healthy foods.  Not getting enough sleep. What increases the risk? The following factors may increase your risk of this condition:  Having a family history of obesity.  Being a woman of African-American descent.  Being a man of Hispanic descent. What are the signs or symptoms? Having excessive body fat is the main symptom of this condition. How is this diagnosed? This condition may be diagnosed based on:  Your symptoms.  Your medical history.  A physical exam. Your health care provider may measure: ? Your BMI. If you are an adult with a BMI between 25 and less than 30, you are considered overweight. If you are an adult with a BMI of 30 or higher, you are considered obese. ? The distances around your hips and your waist (circumferences). These may be compared to each other to help diagnose your condition. ? Your skinfold thickness. Your health care provider may gently pinch a fold of your skin and measure it. How is this treated? Treatment for this condition often includes changing your lifestyle. Treatment may include some or all of the following:  Dietary changes. Work with your health care provider and a  dietitian to set a weight-loss goal that is healthy and reasonable for you. Dietary changes may include eating: ? Smaller portions. A portion size is the amount of a particular food that is healthy for you to eat at  one time. This varies from person to person. ? Low-calorie or low-fat options. ? More whole grains, fruits, and vegetables.  Regular physical activity. This may include aerobic activity (cardio) and strength training.  Medicine to help you lose weight. Your health care provider may prescribe medicine if you are unable to lose 1 pound a week after 6 weeks of eating more healthily and doing more physical activity.  Surgery. Surgical options may include gastric banding and gastric bypass. Surgery may be done if: ? Other treatments have not helped to improve your condition. ? You have a BMI of 40 or higher. ? You have life-threatening health problems related to obesity. Follow these instructions at home:  Eating and drinking   Follow recommendations from your health care provider about what you eat and drink. Your health care provider may advise you to: ? Limit fast foods, sweets, and processed snack foods. ? Choose low-fat options, such as low-fat milk instead of whole milk. ? Eat 5 or more servings of fruits or vegetables every day. ? Eat at home more often. This gives you more control over what you eat. ? Choose healthy foods when you eat out. ? Learn what a healthy portion size is. ? Keep low-fat snacks on hand. ? Avoid sugary drinks, such as soda, fruit juice, iced tea sweetened with sugar, and flavored milk. ? Eat a healthy breakfast.  Drink enough water to keep your urine clear or pale yellow.  Do not go without eating for long periods of time (do not fast) or follow a fad diet. Fasting and fad diets can be unhealthy and even dangerous. Physical Activity  Exercise regularly, as told by your health care provider. Ask your health care provider what types of exercise are safe for you and how often you should exercise.  Warm up and stretch before being active.  Cool down and stretch after being active.  Rest between periods of activity. Lifestyle  Limit the time that you  spend in front of your TV, computer, or video game system.  Find ways to reward yourself that do not involve food.  Limit alcohol intake to no more than 1 drink a day for nonpregnant women and 2 drinks a day for men. One drink equals 12 oz of beer, 5 oz of wine, or 1 oz of hard liquor. General instructions  Keep a weight loss journal to keep track of the food you eat and how much you exercise you get.  Take over-the-counter and prescription medicines only as told by your health care provider.  Take vitamins and supplements only as told by your health care provider.  Consider joining a support group. Your health care provider may be able to recommend a support group.  Keep all follow-up visits as told by your health care provider. This is important. Contact a health care provider if:  You are unable to meet your weight loss goal after 6 weeks of dietary and lifestyle changes. This information is not intended to replace advice given to you by your health care provider. Make sure you discuss any questions you have with your health care provider. Document Released: 09/25/2004 Document Revised: 01/21/2016 Document Reviewed: 06/06/2015 Elsevier Interactive Patient Education  2019 Elsevier Inc.  Preventing Unhealthy [employer], Adult Staying  at a healthy weight is important to your overall health. When fat builds up in your body, you may become overweight or obese. Being overweight or obese increases your risk of developing certain health problems, such as heart disease, diabetes, sleeping problems, joint problems, and some types of cancer. Unhealthy weight gain is often the result of making unhealthy food choices or not getting enough exercise. You can make changes to your lifestyle to prevent obesity and stay as healthy as possible. What nutrition changes can be made?   Eat only as much as your body needs. To do this: ? Pay attention to signs that you are hungry or full. Stop eating as  soon as you feel full. ? If you feel hungry, try drinking water first before eating. Drink enough water so your urine is clear or pale yellow. ? Eat smaller portions. Pay attention to portion sizes when eating out. ? Look at serving sizes on food labels. Most foods contain more than one serving per container. ? Eat the recommended number of calories for your gender and activity level. For most active people, a daily total of 2,000 calories is appropriate. If you are trying to lose weight or are not very active, you may need to eat fewer calories. Talk with your health care provider or a diet and nutrition specialist (dietitian) about how many calories you need each day.  Choose healthy foods, such as: ? Fruits and vegetables. At each meal, try to fill at least half of your plate with fruits and vegetables. ? Whole grains, such as whole-wheat bread, brown rice, and quinoa. ? Lean meats, such as chicken or fish. ? Other healthy proteins, such as beans, eggs, or tofu. ? Healthy fats, such as nuts, seeds, fatty fish, and olive oil. ? Low-fat or fat-free dairy products.  Check food labels, and avoid food and drinks that: ? Are high in calories. ? Have added sugar. ? Are high in sodium. ? Have saturated fats or trans fats.  Cook foods in healthier ways, such as by baking, broiling, or grilling.  Make a meal plan for the week, and shop with a grocery list to help you stay on track with your purchases. Try to avoid going to the grocery store when you are hungry.  When grocery shopping, try to shop around the outside of the store first, where the fresh foods are. Doing this helps you to avoid prepackaged foods, which can be high in sugar, salt (sodium), and fat. What lifestyle changes can be made?   Exercise for 30 or more minutes on 5 or more days each week. Exercising may include brisk walking, yard work, biking, running, swimming, and team sports like basketball and soccer. Ask your health care  provider which exercises are safe for you.  Do muscle-strengthening activities, such as lifting weights or using resistance bands, on 2 or more days a week.  Do not use any products that contain nicotine or tobacco, such as cigarettes and e-cigarettes. If you need help quitting, ask your health care provider.  Limit alcohol intake to no more than 1 drink a day for nonpregnant women and 2 drinks a day for men. One drink equals 12 oz of beer, 5 oz of wine, or 1 oz of hard liquor.  Try to get 7-9 hours of sleep each night. What other changes can be made?  Keep a food and activity journal to keep track of: ? What you ate and how many calories you had. Remember to count  the calories in sauces, dressings, and side dishes. ? Whether you were active, and what exercises you did. ? Your calorie, weight, and activity goals.  Check your weight regularly. Track any changes. If you notice you have gained weight, make changes to your diet or activity routine.  Avoid taking weight-loss medicines or supplements. Talk to your health care provider before starting any new medicine or supplement.  Talk to your health care provider before trying any new diet or exercise plan. Why are these changes important? Eating healthy, staying active, and having healthy habits can help you to prevent obesity. Those changes also:  Help you manage stress and emotions.  Help you connect with friends and family.  Improve your self-esteem.  Improve your sleep.  Prevent long-term health problems. What can happen if changes are not made? Being obese or overweight can cause you to develop joint or bone problems, which can make it hard for you to stay active or do activities you enjoy. Being obese or overweight also puts stress on your heart and lungs and can lead to health problems like diabetes, heart disease, and some cancers. Where to find more information Talk with your health care provider or a dietitian about  healthy eating and healthy lifestyle choices. You may also find information from:  U.S. Department of Agriculture, MyPlate: FormerBoss.no  American Heart Association: www.heart.org  Centers for Disease Control and Prevention: http://www.wolf.info/ Summary  Staying at a healthy weight is important to your overall health. It helps you to prevent certain diseases and health problems, such as heart disease, diabetes, joint problems, sleep disorders, and some types of cancer.  Being obese or overweight can cause you to develop joint or bone problems, which can make it hard for you to stay active or do activities you enjoy.  You can prevent unhealthy weight gain by eating a healthy diet, exercising regularly, not smoking, limiting alcohol, and getting enough sleep.  Talk with your health care provider or a dietitian for guidance about healthy eating and healthy lifestyle choices. This information is not intended to replace advice given to you by your health care provider. Make sure you discuss any questions you have with your health care provider. Document Released: 08/19/2016 Document Revised: 05/29/2017 Document Reviewed: 09/24/2016 Elsevier Interactive Patient Education  2019 [employer].

## 2018-09-10 NOTE — Progress Notes (Signed)
BP 118/64   Pulse 87   Temp 98 F (36.7 C) (Oral)   Ht 5\' 8"  (1.727 m)   Wt 226 lb 14.4 oz (102.9 kg)   SpO2 97%   BMI 34.50 kg/m    Subjective:    Patient ID: Robin Bryan, female    DOB: Nov 21, 1950, 68 y.o.   MRN: [id number]  HPI: Robin Bryan is a 68 y.o. female  Chief Complaint  Patient presents with  . Follow-up  . Dizziness    lightheaded with tremors in hands. pt states it felt like she was having a TIA, but states didnt have one because she has had one before and did not go through all the symptoms?  . Arm Pain    right shoulder and arm    HPI Patient is here for routine f/u but she had an "episode" last night she wants to tell me about  She was cooking supper; hands started trembling so bad that she couldn't continue; she takes primidone and tizanidine; takes them before bed usually but took them early after the shaking started; she has had TIAs before but doesn't think this was a TIA; it was scary she said; lasted maybe 5 to 7 minutes; just hands shaking, both hands equally; thinking was clear; daughter came in and she was white as a ghost; no chest pain, no funny palpitations; she had been having some pain in the back at the bra line; could not get comfortable; stomach has been bothering her; ate chili for two days; had a lot of gas; right shoulder and arm pain have been going on for a while, nothing new; sees neurologist and gets shots in her arms; she did not call her neurologist about the light-headedness and tremors though; did not go to the ER; no personal hx of heart disease except for a heart murmur she says, but there is positive hx in the family; her father had from heart disease (murmur, no blockages or stents) but died from lung cancer; good sleep last night She feels somewhat tired but no pain, no unusual tremors now  She usually gets her lungs checked every year because of her family hx of lung cancer (father and sister)  She is on gabapentin 800 mg four  times a day per neurologist; tizanidine 4 mg one by mouth at bedtime  Having pain in the LUQ; going on for one month; no blood in stool; no change in appetite; just eating a lot; weight gain, she let herself go with her eating; she was on a program but then did poorly over the holidays  Prediabetes; last A1c 5.7; weight gain noted  High cholesterol; LDL 71; room for improvement with diet; weight gain noted; might eat 2-3 eggs a week  Fatty liver noted on [place] CLINICAL DATA:  Previously documented liver hemangioma. History of ovarian carcinoma  EXAM: ULTRASOUND ABDOMEN LIMITED RIGHT UPPER QUADRANT  COMPARISON:  Ultrasound right upper quadrant December 04, 2017; CT abdomen and pelvis May 17, 2014; abdominal MRI October 22, 2006  FINDINGS: Gallbladder:  No gallstones or wall thickening visualized. There is no pericholecystic fluid. No sonographic Murphy sign noted by sonographer.  Common bile duct:  Diameter: 6 mm. No intrahepatic or extrahepatic biliary duct dilatation.  Liver:  There is a persistent echogenic focus in the right lobe of the liver measuring 2.6 x 2.0 x 2.1 cm, essentially stable compared to prior studies and felt to be consistent with hemangioma. No new focal liver lesion  evident. Liver echogenicity overall is increased. Portal vein is patent on color Doppler imaging with normal direction of blood flow towards the liver.  IMPRESSION: Stable echogenic lesion in the right lobe of the liver, felt to represent hemangioma. No new liver lesions evident. Note that there is increase in liver echogenicity overall consistent with underlying hepatic steatosis.  Study otherwise unremarkable.   Electronically Signed   By: Lowella Grip III M.D.   On: 09/02/2018 09:23   Glaucoma on one of her eyes; would like to see her eye doctor; last visit was Jan 2019; she sees Dr. Thomasene Ripple  Depression screen [facility] 2/9 09/10/2018 05/13/2018 03/19/2018  02/25/2018 12/09/2017  Decreased Interest 1 2 0 0 0  Down, Depressed, Hopeless 2 2 0 0 0  PHQ - 2 Score 3 4 0 0 0  Altered sleeping 0 2 0 0 -  Tired, decreased energy 1 2 0 0 -  Change in appetite 0 0 0 0 -  Feeling bad or failure about yourself  0 0 0 0 -  Trouble concentrating 0 0 0 0 -  Moving slowly or fidgety/restless 0 0 0 0 -  Suicidal thoughts 0 0 0 0 -  PHQ-9 Score 4 8 0 0 -  Difficult doing work/chores Not difficult at all Not difficult at all Not difficult at all Not difficult at all -  Some recent data might be hidden   Fall Risk  09/10/2018 07/06/2018 05/13/2018 03/19/2018 02/25/2018  Falls in the past year? 0 0 No No No  Number falls in past yr: 0 - - - -  Comment - - - - -  Injury with Fall? 0 - - - -  Comment - - - - -  Risk Factor Category  - - - - -  Risk for fall due to : - - Impaired vision;History of fall(s);Impaired balance/gait;Medication side effect - -  Risk for fall due to: Comment - - wears eyeglasses; unsteady gait led to fall in past - -  Follow up - - - - -    Relevant past medical, surgical, family and social history reviewed Past Medical History:  Diagnosis Date  . Anxiety   . Arthritis   . Arthritis of both feet 11/26/2017   xrays March 2019  . Asthma   . Barrett's esophagus   . Bronchitis   . Calcaneal spur of both feet 11/26/2017   Foot xrays March 2019  . Depression   . Diverticulosis   . Esophageal stricture   . Fatty liver 09/08/2018   [place] Jan 2020  . Fibromyalgia   . GERD (gastroesophageal reflux disease)   . Hx of adenomatous colonic polyps   . Hyperlipidemia   . Hypertension   . Intramural leiomyoma of uterus   . Liver hemangioma 09/08/2018   [place] Jan 2020  . Myocardial infarction ([place])   . Stroke [facility])    Past Surgical History:  Procedure Laterality Date  . ABDOMINAL HYSTERECTOMY    . INCONTINENCE SURGERY    . TUBAL LIGATION    . tumor excision ovaries     Family History  Problem Relation Age of Onset  . Ovarian cancer Sister     . Cancer Sister   . Diabetes Sister   . Heart disease Sister   . Lung cancer Sister   . Colon polyps Mother   . Diabetes Mother   . Parkinson's disease Cousin   . Cancer Father        lung  .  Diabetes Daughter   . Colon cancer Neg Hx   . Esophageal cancer Neg Hx   . Pancreatic cancer Neg Hx   . Stomach cancer Neg Hx    Social History   Tobacco Use  . Smoking status: Former Smoker    Packs/day: 0.50    Years: 8.00    Pack years: 4.00    Types: Cigarettes    Last attempt to quit: 05/27/2011    Years since quitting: 7.3  . Smokeless tobacco: Never Used  . Tobacco comment: smoking cessation materials not required  Substance Use Topics  . Alcohol use: No  . Drug use: No     [facility] Visit from 09/10/2018 in [facility]  AUDIT-C Score  0      Interim medical history since last visit reviewed. Allergies and medications reviewed  Review of Systems Per HPI unless specifically indicated above     Objective:    BP 118/64   Pulse 87   Temp 98 F (36.7 C) (Oral)   Ht 5\' 8"  (1.727 m)   Wt 226 lb 14.4 oz (102.9 kg)   SpO2 97%   BMI 34.50 kg/m   Wt Readings from Last 3 Encounters:  09/10/18 226 lb 14.4 oz (102.9 kg)  07/06/18 221 lb 4.8 oz (100.4 kg)  05/13/18 208 lb 4.8 oz (94.5 kg)    Physical Exam Constitutional:      General: She is not in acute distress.    Appearance: She is well-developed. She is not diaphoretic.  HENT:     Head: Normocephalic and atraumatic.  Eyes:     General: No scleral icterus. Neck:     Thyroid: No thyromegaly.  Cardiovascular:     Rate and Rhythm: Normal rate and regular rhythm.     Heart sounds: Normal heart sounds. No murmur.  Pulmonary:     Effort: Pulmonary effort is normal. No respiratory distress.     Breath sounds: Normal breath sounds. No wheezing.  Abdominal:     General: Bowel sounds are normal. There is no distension.     Palpations: Abdomen is soft.  Skin:    General: Skin is warm and dry.      Coloration: Skin is not pale.  Neurological:     Mental Status: She is alert.  Psychiatric:        Behavior: Behavior normal.        Thought Content: Thought content normal.        Judgment: Judgment normal.     Results for orders placed or performed in visit on 08/31/18  HgB A1c  Result Value Ref Range   Hgb A1c MFr Bld 5.7 (H) <5.7 % of total Hgb   Mean Plasma Glucose 117 (calc)   eAG (mmol/L) 6.5 (calc)  COMPLETE METABOLIC PANEL WITH GFR  Result Value Ref Range   Glucose, Bld 109 (H) 65 - 99 mg/dL   BUN 18 7 - 25 mg/dL   Creat 0.78 0.50 - 0.99 mg/dL   GFR, Est Non African American 80 > OR = 60 mL/min/1.34m2   GFR, Est African American 92 > OR = 60 mL/min/1.84m2   BUN/Creatinine Ratio NOT APPLICABLE 6 - 22 (calc)   Sodium 141 135 - 146 mmol/L   Potassium 4.5 3.5 - 5.3 mmol/L   Chloride 105 98 - 110 mmol/L   CO2 29 20 - 32 mmol/L   Calcium 9.3 8.6 - 10.4 mg/dL   Total Protein 6.5 6.1 - 8.1 g/dL  Albumin 4.3 3.6 - 5.1 g/dL   Globulin 2.2 1.9 - 3.7 g/dL (calc)   AG Ratio 2.0 1.0 - 2.5 (calc)   Total Bilirubin 0.4 0.2 - 1.2 mg/dL   Alkaline phosphatase (APISO) 74 33 - 130 U/L   AST 10 10 - 35 U/L   ALT 14 6 - 29 U/L  Lipid panel  Result Value Ref Range   Cholesterol 134 <200 mg/dL   HDL 37 (L) >50 mg/dL   Triglycerides 181 (H) <150 mg/dL   LDL Cholesterol (Calc) 71 mg/dL (calc)   Total CHOL/HDL Ratio 3.6 <5.0 (calc)   Non-HDL Cholesterol (Calc) 97 <130 mg/dL (calc)      Assessment & Plan:   Problem List Items Addressed This Visit      Other   Back pain   Relevant Medications   tiZANidine (ZANAFLEX) 4 MG capsule   Other Relevant Orders   DG Chest 2 View (Completed)   DG Thoracic Spine W/Swimmers (Completed)   Estrogen deficiency    Other Visit Diagnoses    Dizziness    -  Primary   no chest pain; ddx includes hypotension, hypoglycemia, anxiety; currently asymptomatic   Relevant Orders   EKG 12-Lead (Completed)   Tremor of both hands       advised  patient to contact her neurologist about her episode; ddx includes hypoglycemia, hypotension; currently back to baseline       Follow up plan: Return in about 3 months (around 12/10/2018) for follow-up visit with Dr. Sanda Klein.  An after-visit summary was printed and given to the patient at [facility].  Please see the patient instructions which may contain other information and recommendations beyond what is mentioned above in the assessment and plan.  No orders of the defined types were placed in this encounter.   Orders Placed This Encounter  Procedures  . DG Chest 2 View  . DG Thoracic Spine W/Swimmers  . EKG 12-Lead

## 2018-09-15 ENCOUNTER — Ambulatory Visit
Admission: RE | Admit: 2018-09-15 | Discharge: 2018-09-15 | Disposition: A | Discharge status: Home / Self Care | Payer: Medicare HMO | Attending: Family Medicine | Admitting: Family Medicine

## 2018-09-15 ENCOUNTER — Ambulatory Visit
Admission: RE | Admit: 2018-09-15 | Discharge: 2018-09-15 | Disposition: A | Discharge status: Home / Self Care | Payer: Medicare HMO | Source: Ambulatory Visit | Attending: Family Medicine | Admitting: Family Medicine

## 2018-09-15 DIAGNOSIS — M549 Dorsalgia, unspecified: Secondary | ICD-10-CM | POA: Diagnosis not present

## 2018-09-15 DIAGNOSIS — M546 Pain in thoracic spine: Secondary | ICD-10-CM | POA: Diagnosis present

## 2018-09-15 DIAGNOSIS — R079 Chest pain, unspecified: Secondary | ICD-10-CM | POA: Diagnosis not present

## 2018-09-17 ENCOUNTER — Telehealth: Payer: Self-pay | Admitting: Family Medicine

## 2018-09-17 NOTE — Telephone Encounter (Signed)
I called patient "I feel fine" she says; mood is okay No SI/HI Taking escitalopram right now and it's helping  She has not tried citalopram Prozac made her like a mummy, suicidal She has tried Paxil before, doesn't remember any reason why she stopped Was on sertraline 100 mg, but afraid to take higher She does not want to take mirtazipine (wt gain) Incomplete responses to others

## 2018-09-20 ENCOUNTER — Encounter: Payer: Self-pay | Admitting: Family Medicine

## 2018-09-20 ENCOUNTER — Telehealth: Payer: Self-pay | Admitting: Family Medicine

## 2018-09-20 ENCOUNTER — Ambulatory Visit (INDEPENDENT_AMBULATORY_CARE_PROVIDER_SITE_OTHER): Payer: Medicare HMO | Admitting: Family Medicine

## 2018-09-20 VITALS — BP 126/72 | HR 92 | Temp 98.9°F | Wt 224.1 lb

## 2018-09-20 DIAGNOSIS — J029 Acute pharyngitis, unspecified: Secondary | ICD-10-CM

## 2018-09-20 DIAGNOSIS — R52 Pain, unspecified: Secondary | ICD-10-CM

## 2018-09-20 DIAGNOSIS — R69 Illness, unspecified: Secondary | ICD-10-CM

## 2018-09-20 DIAGNOSIS — J111 Influenza due to unidentified influenza virus with other respiratory manifestations: Secondary | ICD-10-CM

## 2018-09-20 LAB — POCT RAPID STREP A (OFFICE): Rapid Strep A Screen: NEGATIVE

## 2018-09-20 LAB — POCT INFLUENZA A/B
Influenza A, POC: NEGATIVE
Influenza B, POC: NEGATIVE

## 2018-09-20 MED ORDER — OSELTAMIVIR PHOSPHATE 75 MG PO CAPS: 75.0000 mg | ORAL_CAPSULE | Freq: Two times a day (BID) | ORAL | 0 refills | Status: DC

## 2018-09-20 NOTE — Telephone Encounter (Signed)
Pt has appt with Dr. Sanda Klein at 1:20 today.

## 2018-09-20 NOTE — Progress Notes (Signed)
BP 126/72   Pulse 92   Temp 98.9 F (37.2 C)   Wt 224 lb 1.6 oz (101.7 kg)   SpO2 96%   BMI 34.07 kg/m    Subjective:    Patient ID: Robin Bryan, female    DOB: Jan 31, 1951, 68 y.o.   MRN: [id number]  HPI: Robin Bryan is a 68 y.o. female  Chief Complaint  Patient presents with  . URI    Onset 1 day- headache, sore throat, body aches and cough    HPI She is here for a sick visit Just get sick yesterday morning; started with cough, little bit of phlegm Then last night, got worse as the day went on This morning, "wham!" Body aches, "Oh Lord, yes!" Husband has been sick, he brought something home She has tried sudafed in the past, afraid to take it though because of all of her medicines Took some tylenol instead, mucinex  Depression screen [facility] 2/9 09/20/2018 09/10/2018 05/13/2018 03/19/2018 02/25/2018  Decreased Interest 0 1 2 0 0  Down, Depressed, Hopeless 0 2 2 0 0  PHQ - 2 Score 0 3 4 0 0  Altered sleeping 0 0 2 0 0  Tired, decreased energy 0 1 2 0 0  Change in appetite 0 0 0 0 0  Feeling bad or failure about yourself  0 0 0 0 0  Trouble concentrating 0 0 0 0 0  Moving slowly or fidgety/restless 0 0 0 0 0  Suicidal thoughts 0 0 0 0 0  PHQ-9 Score 0 4 8 0 0  Difficult doing work/chores Not difficult at all Not difficult at all Not difficult at all Not difficult at all Not difficult at all  Some recent data might be hidden   Fall Risk  09/20/2018 09/10/2018 07/06/2018 05/13/2018 03/19/2018  Falls in the past year? 0 0 0 No No  Number falls in past yr: - 0 - - -  Comment - - - - -  Injury with Fall? - 0 - - -  Comment - - - - -  Risk Factor Category  - - - - -  Risk for fall due to : - - - Impaired vision;History of fall(s);Impaired balance/gait;Medication side effect -  Risk for fall due to: Comment - - - wears eyeglasses; unsteady gait led to fall in past -  Follow up - - - - -    Relevant past medical, surgical, family and social history reviewed Past Medical  History:  Diagnosis Date  . Anxiety   . Arthritis   . Arthritis of both feet 11/26/2017   xrays March 2019  . Asthma   . Barrett's esophagus   . Bronchitis   . Calcaneal spur of both feet 11/26/2017   Foot xrays March 2019  . Depression   . Diverticulosis   . Esophageal stricture   . Fatty liver 09/08/2018   [place] Jan 2020  . Fibromyalgia   . GERD (gastroesophageal reflux disease)   . Hx of adenomatous colonic polyps   . Hyperlipidemia   . Hypertension   . Intramural leiomyoma of uterus   . Liver hemangioma 09/08/2018   [place] Jan 2020  . Myocardial infarction ([place])   . Stroke [facility])    Past Surgical History:  Procedure Laterality Date  . ABDOMINAL HYSTERECTOMY    . INCONTINENCE SURGERY    . TUBAL LIGATION    . tumor excision ovaries     Family History  Problem Relation Age of  Onset  . Ovarian cancer Sister   . Cancer Sister   . Diabetes Sister   . Heart disease Sister   . Lung cancer Sister   . Colon polyps Mother   . Diabetes Mother   . Parkinson's disease Cousin   . Cancer Father        lung  . Diabetes Daughter   . Colon cancer Neg Hx   . Esophageal cancer Neg Hx   . Pancreatic cancer Neg Hx   . Stomach cancer Neg Hx    Social History   Tobacco Use  . Smoking status: Former Smoker    Packs/day: 0.50    Years: 8.00    Pack years: 4.00    Types: Cigarettes    Last attempt to quit: 05/27/2011    Years since quitting: 7.3  . Smokeless tobacco: Never Used  . Tobacco comment: smoking cessation materials not required  Substance Use Topics  . Alcohol use: No  . Drug use: No     Office Visit from 09/20/2018 in [facility]  AUDIT-C Score  0      Interim medical history since last visit reviewed. Allergies and medications reviewed  Review of Systems  Constitutional: Positive for chills and fatigue.  HENT: Positive for rhinorrhea and sore throat.   Respiratory: Positive for cough (some phlegm).   Musculoskeletal:       Body aches    Neurological: Positive for headaches.   Per HPI unless specifically indicated above     Objective:    BP 126/72   Pulse 92   Temp 98.9 F (37.2 C)   Wt 224 lb 1.6 oz (101.7 kg)   SpO2 96%   BMI 34.07 kg/m   Wt Readings from Last 3 Encounters:  09/20/18 224 lb 1.6 oz (101.7 kg)  09/10/18 226 lb 14.4 oz (102.9 kg)  07/06/18 221 lb 4.8 oz (100.4 kg)    Physical Exam Constitutional:      Appearance: She is well-developed.  HENT:     Right Ear: Tympanic membrane and ear canal normal.     Left Ear: Tympanic membrane and ear canal normal.     Nose: Rhinorrhea (clear) present.     Mouth/Throat:     Pharynx: Posterior oropharyngeal erythema (mildly injected) present. No pharyngeal swelling or oropharyngeal exudate.  Eyes:     General: No scleral icterus.       Right eye: No discharge.        Left eye: No discharge.     Conjunctiva/sclera:     Right eye: Right conjunctiva is injected.     Left eye: Left conjunctiva is injected.     Comments: Eyes slightly injected  Cardiovascular:     Rate and Rhythm: Normal rate and regular rhythm.  Pulmonary:     Effort: Pulmonary effort is normal.     Breath sounds: Normal breath sounds.  Skin:    General: Skin is warm.     Coloration: Skin is not pale.     Findings: No rash.  Psychiatric:        Mood and Affect: Mood is not anxious or depressed.        Behavior: Behavior normal.     Results for orders placed or performed in visit on 09/20/18  POCT rapid strep A  Result Value Ref Range   Rapid Strep A Screen Negative Negative  POCT Influenza A/B  Result Value Ref Range   Influenza A, POC Negative Negative  Influenza B, POC Negative Negative      Assessment & Plan:   Problem List Items Addressed This Visit    None    Visit Diagnoses    Influenza-like illness    -  Primary   suspicious for influenza though test was negative; I was taught to treat the patient, not test result; risk of false neg discussed; start tamiflu    Sore throat       does not look like strep, but rapid negative and culture pending   Relevant Orders   Culture, Group A Strep   POCT rapid strep A (Completed)   POCT Influenza A/B (Completed)   Body aches       Relevant Orders   Culture, Group A Strep   POCT rapid strep A (Completed)   POCT Influenza A/B (Completed)       Follow up plan: Return if symptoms worsen or fail to improve.  An after-visit summary was printed and given to the patient at [place].  Please see the patient instructions which may contain other information and recommendations beyond what is mentioned above in the assessment and plan.  Meds ordered this encounter  Medications  . oseltamivir (TAMIFLU) 75 MG capsule    Sig: Take 1 capsule (75 mg total) by mouth 2 (two) times daily.    Dispense:  10 capsule    Refill:  0    Orders Placed This Encounter  Procedures  . Culture, Group A Strep  . POCT rapid strep A  . POCT Influenza A/B

## 2018-09-20 NOTE — Patient Instructions (Addendum)
Go to the ER if you get significantly worse (anything suspicious for pneumonia) Start the new medicine (Tamiflu) Try vitamin C (orange juice if not diabetic or vitamin C tablets) and drink green tea to help your immune system during your illness Get plenty of rest and hydration   Influenza, Adult Influenza is also called "the flu." It is an infection in the lungs, nose, and throat (respiratory tract). It is caused by a virus. The flu causes symptoms that are similar to symptoms of a cold. It also causes a high fever and body aches. The flu spreads easily from person to person (is contagious). Getting a flu shot (influenza vaccination) every year is the best way to prevent the flu. What are the causes? This condition is caused by the influenza virus. You can get the virus by:  Breathing in droplets that are in the air from the cough or sneeze of a person who has the virus.  Touching something that has the virus on it (is contaminated) and then touching your mouth, nose, or eyes. What increases the risk? Certain things may make you more likely to get the flu. These include:  Not washing your hands often.  Having close contact with many people during cold and flu season.  Touching your mouth, eyes, or nose without first washing your hands.  Not getting a flu shot every year. You may have a higher risk for the flu, along with serious problems such as a lung infection (pneumonia), if you:  Are older than 65.  Are pregnant.  Have a weakened disease-fighting system (immune system) because of a disease or taking certain medicines.  Have a long-term (chronic) illness, such as: ? Heart, kidney, or lung disease. ? Diabetes. ? Asthma.  Have a liver disorder.  Are very overweight (morbidly obese).  Have anemia. This is a condition that affects your red blood cells. What are the signs or symptoms? Symptoms usually begin suddenly and last 4-14 days. They may include:  Fever and  chills.  Headaches, body aches, or muscle aches.  Sore throat.  Cough.  Runny or stuffy (congested) nose.  Chest discomfort.  Not wanting to eat as much as normal (poor appetite).  Weakness or feeling tired (fatigue).  Dizziness.  Feeling sick to your stomach (nauseous) or throwing up (vomiting). How is this treated? If the flu is found early, you can be treated with medicine that can help reduce how bad the illness is and how long it lasts (antiviral medicine). This may be given by mouth (orally) or through an IV tube. Taking care of yourself at home can help your symptoms get better. Your doctor may suggest:  Taking over-the-counter medicines.  Drinking plenty of fluids. The flu often goes away on its own. If you have very bad symptoms or other problems, you may be treated in a hospital. Follow these instructions at home:     Activity  Rest as needed. Get plenty of sleep.  Stay home from work or school as told by your doctor. ? Do not leave home until you do not have a fever for 24 hours without taking medicine. ? Leave home only to visit your doctor. Eating and drinking  Take an ORS (oral rehydration solution). This is a drink that is sold at pharmacies and stores.  Drink enough fluid to keep your pee (urine) pale yellow.  Drink clear fluids in small amounts as you are able. Clear fluids include: ? Water. ? Ice chips. ? Fruit juice that  has water added (diluted fruit juice). ? Low-calorie sports drinks.  Eat bland, easy-to-digest foods in small amounts as you are able. These foods include: ? Bananas. ? Applesauce. ? Rice. ? Lean meats. ? Toast. ? Crackers.  Do not eat or drink: ? Fluids that have a lot of sugar or caffeine. ? Alcohol. ? Spicy or fatty foods. General instructions  Take over-the-counter and prescription medicines only as told by your doctor.  Use a cool mist humidifier to add moisture to the air in your home. This can make it easier  for you to breathe.  Cover your mouth and nose when you cough or sneeze.  Wash your hands with soap and water often, especially after you cough or sneeze. If you cannot use soap and water, use alcohol-based hand sanitizer.  Keep all follow-up visits as told by your doctor. This is important. How is this prevented?   Get a flu shot every year. You may get the flu shot in late summer, fall, or winter. Ask your doctor when you should get your flu shot.  Avoid contact with people who are sick during fall and winter (cold and flu season). Contact a doctor if:  You get new symptoms.  You have: ? Chest pain. ? Watery poop (diarrhea). ? A fever.  Your cough gets worse.  You start to have more mucus.  You feel sick to your stomach.  You throw up. Get help right away if you:  Have shortness of breath.  Have trouble breathing.  Have skin or nails that turn a bluish color.  Have very bad pain or stiffness in your neck.  Get a sudden headache.  Get sudden pain in your face or ear.  Cannot eat or drink without throwing up. Summary  Influenza ("the flu") is an infection in the lungs, nose, and throat. It is caused by a virus.  Take over-the-counter and prescription medicines only as told by your doctor.  Getting a flu shot every year is the best way to avoid getting the flu. This information is not intended to replace advice given to you by your health care provider. Make sure you discuss any questions you have with your health care provider. Document Released: 05/27/2008 Document Revised: 02/03/2018 Document Reviewed: 02/03/2018 Elsevier Interactive Patient Education  2019 [employer].

## 2018-09-20 NOTE — Telephone Encounter (Signed)
Copied from [place]. Topic: Quick Communication - See Telephone Encounter >> Sep 20, 2018  8:14 AM Robina Ade, Helene Kelp D wrote: CRM for notification. See Telephone encounter for: 09/20/18. Patient called and would like to talk to Dr. Sanda Klein or her CMA regarding a bad cough she is having. Also wants to know if she would call her something in to her pharmacy. Please call pt. Back, thanks.

## 2018-09-20 NOTE — Telephone Encounter (Signed)
Appointment please (me or Benjamine Mola or urgent care)

## 2018-09-22 ENCOUNTER — Other Ambulatory Visit: Payer: Self-pay | Admitting: Family Medicine

## 2018-09-22 LAB — CULTURE, GROUP A STREP
MICRO NUMBER:: 79144
SPECIMEN QUALITY:: ADEQUATE

## 2018-09-24 DIAGNOSIS — J209 Acute bronchitis, unspecified: Secondary | ICD-10-CM | POA: Diagnosis not present

## 2018-09-24 NOTE — Telephone Encounter (Signed)
Patient is calling and states she is finished with the TheraFlu but still has a temp of 99.5 and sore throat and cough. She is unsure what she should do next.

## 2018-09-24 NOTE — Telephone Encounter (Signed)
Pt notified, stated she is going to go this afternoon.

## 2018-09-24 NOTE — Telephone Encounter (Signed)
I'm sure she meant "Tamiflu" If the cough is worsening, she needs to be seen at urgent care where they can get a chest xray in case pneumonia is developing Please direct her to urgent care

## 2018-10-07 DIAGNOSIS — G5603 Carpal tunnel syndrome, bilateral upper limbs: Secondary | ICD-10-CM | POA: Diagnosis not present

## 2018-10-07 DIAGNOSIS — M5412 Radiculopathy, cervical region: Secondary | ICD-10-CM | POA: Diagnosis not present

## 2018-10-07 DIAGNOSIS — G250 Essential tremor: Secondary | ICD-10-CM | POA: Diagnosis not present

## 2018-10-07 DIAGNOSIS — G5622 Lesion of ulnar nerve, left upper limb: Secondary | ICD-10-CM | POA: Diagnosis not present

## 2018-10-07 DIAGNOSIS — Z79899 Other long term (current) drug therapy: Secondary | ICD-10-CM | POA: Diagnosis not present

## 2018-10-07 DIAGNOSIS — M5417 Radiculopathy, lumbosacral region: Secondary | ICD-10-CM | POA: Diagnosis not present

## 2018-10-07 DIAGNOSIS — M797 Fibromyalgia: Secondary | ICD-10-CM | POA: Diagnosis not present

## 2018-10-13 ENCOUNTER — Ambulatory Visit
Admission: RE | Admit: 2018-10-13 | Discharge: 2018-10-13 | Disposition: A | Discharge status: Home / Self Care | Payer: Medicare HMO | Source: Ambulatory Visit | Attending: Family Medicine | Admitting: Family Medicine

## 2018-10-13 ENCOUNTER — Encounter: Payer: Self-pay | Admitting: Family Medicine

## 2018-10-13 ENCOUNTER — Ambulatory Visit
Admission: RE | Admit: 2018-10-13 | Discharge: 2018-10-13 | Disposition: A | Discharge status: Home / Self Care | Payer: Medicare HMO | Attending: Family Medicine | Admitting: Family Medicine

## 2018-10-13 ENCOUNTER — Ambulatory Visit (INDEPENDENT_AMBULATORY_CARE_PROVIDER_SITE_OTHER): Payer: Medicare HMO | Admitting: Family Medicine

## 2018-10-13 ENCOUNTER — Other Ambulatory Visit: Payer: Self-pay | Admitting: Family Medicine

## 2018-10-13 VITALS — BP 128/76 | HR 81 | Temp 98.3°F | Resp 16 | Ht 68.0 in | Wt 232.9 lb

## 2018-10-13 DIAGNOSIS — R05 Cough: Secondary | ICD-10-CM

## 2018-10-13 DIAGNOSIS — R059 Cough, unspecified: Secondary | ICD-10-CM

## 2018-10-13 DIAGNOSIS — J209 Acute bronchitis, unspecified: Secondary | ICD-10-CM

## 2018-10-13 DIAGNOSIS — E2839 Other primary ovarian failure: Secondary | ICD-10-CM | POA: Diagnosis not present

## 2018-10-13 DIAGNOSIS — R079 Chest pain, unspecified: Secondary | ICD-10-CM | POA: Diagnosis not present

## 2018-10-13 DIAGNOSIS — H65192 Other acute nonsuppurative otitis media, left ear: Secondary | ICD-10-CM | POA: Diagnosis not present

## 2018-10-13 MED ORDER — LEVOFLOXACIN 500 MG PO TABS: 500.0000 mg | ORAL_TABLET | Freq: Every day | ORAL | 0 refills | Status: DC

## 2018-10-13 MED ORDER — CLOPIDOGREL BISULFATE 75 MG PO TABS: 75.0000 mg | ORAL_TABLET | Freq: Every day | ORAL | 3 refills | Status: DC

## 2018-10-13 NOTE — Progress Notes (Signed)
Discussed CXR with patient Treating with Levaquin Repeat CXR in 3 weeks If not getting better, will refer to pulmonologist If getting worse, go to ER or call right away

## 2018-10-13 NOTE — Progress Notes (Signed)
BP 128/76   Pulse 81   Temp 98.3 F (36.8 C)   Resp 16   Ht 5\' 8"  (1.727 m)   Wt 232 lb 14.4 oz (105.6 kg)   SpO2 97%   BMI 35.41 kg/m    Subjective:    Patient ID: Robin Bryan, female    DOB: 1950-12-06, 68 y.o.   MRN: [id number]  HPI: Robin Bryan is a 68 y.o. female  Chief Complaint  Patient presents with  . URI    Onset over a month ago. Was seen at [facility] on 09/24/17, patient has summary from visit.     HPI Patient is here for an acute visit; cough for at least 3 weeks or more No travel to [place] and no trips to the airport Went to [place] on Sep 24, 2018 and prescribed tessalon, prednisone, augmentin, zpak, and SABA The medicines did not help really at all she thinks; the prednisone might have helped a little Coughing up yellow phlegm; it was green, but has gone back to yellow; gets hung up in the throat; gagging She has been treatd with levaquin in the past No weird rash Ears are tingling No sore throat No sinus problems Upper chest wall is sore from coughing, and along diaphragm Tessalon perles are not working this time  Need refill of plavix; taking it for life for TIAs; no issues with bleeding  Depression screen [facility] 2/9 10/13/2018 09/20/2018 09/10/2018 05/13/2018 03/19/2018  Decreased Interest 0 0 1 2 0  Down, Depressed, Hopeless 0 0 2 2 0  PHQ - 2 Score 0 0 3 4 0  Altered sleeping 0 0 0 2 0  Tired, decreased energy 0 0 1 2 0  Change in appetite 0 0 0 0 0  Feeling bad or failure about yourself  0 0 0 0 0  Trouble concentrating 0 0 0 0 0  Moving slowly or fidgety/restless 0 0 0 0 0  Suicidal thoughts 0 0 0 0 0  PHQ-9 Score 0 0 4 8 0  Difficult doing work/chores Not difficult at all Not difficult at all Not difficult at all Not difficult at all Not difficult at all  Some recent data might be hidden   Fall Risk  10/13/2018 09/20/2018 09/10/2018 07/06/2018 05/13/2018  Falls in the past year? 0 0 0 0 No  Number falls in past yr: - - 0 - -  Comment - - - - -    Injury with Fall? - - 0 - -  Comment - - - - -  Risk Factor Category  - - - - -  Risk for fall due to : - - - - Impaired vision;History of fall(s);Impaired balance/gait;Medication side effect  Risk for fall due to: Comment - - - - wears eyeglasses; unsteady gait led to fall in past  Follow up - - - - -    Relevant past medical, surgical, family and social history reviewed Past Medical History:  Diagnosis Date  . Anxiety   . Arthritis   . Arthritis of both feet 11/26/2017   xrays March 2019  . Asthma   . Barrett's esophagus   . Bronchitis   . Calcaneal spur of both feet 11/26/2017   Foot xrays March 2019  . Depression   . Diverticulosis   . Esophageal stricture   . Fatty liver 09/08/2018   [place] Jan 2020  . Fibromyalgia   . GERD (gastroesophageal reflux disease)   . Hx of adenomatous  colonic polyps   . Hyperlipidemia   . Hypertension   . Intramural leiomyoma of uterus   . Liver hemangioma 09/08/2018   [place] Jan 2020  . Myocardial infarction ([place])   . Stroke [facility])    Past Surgical History:  Procedure Laterality Date  . ABDOMINAL HYSTERECTOMY    . INCONTINENCE SURGERY    . TUBAL LIGATION    . tumor excision ovaries     Family History  Problem Relation Age of Onset  . Ovarian cancer Sister   . Cancer Sister   . Diabetes Sister   . Heart disease Sister   . Lung cancer Sister   . Colon polyps Mother   . Diabetes Mother   . Parkinson's disease Cousin   . Cancer Father        lung  . Diabetes Daughter   . Colon cancer Neg Hx   . Esophageal cancer Neg Hx   . Pancreatic cancer Neg Hx   . Stomach cancer Neg Hx    Social History   Tobacco Use  . Smoking status: Former Smoker    Packs/day: 0.50    Years: 8.00    Pack years: 4.00    Types: Cigarettes    Last attempt to quit: 05/27/2011    Years since quitting: 7.3  . Smokeless tobacco: Never Used  . Tobacco comment: smoking cessation materials not required  Substance Use Topics  . Alcohol use: No  . Drug use: No      Office Visit from 10/13/2018 in [facility]  AUDIT-C Score  1      Interim medical history since last visit reviewed. Allergies and medications reviewed  Review of Systems Per HPI unless specifically indicated above     Objective:    BP 128/76   Pulse 81   Temp 98.3 F (36.8 C)   Resp 16   Ht 5\' 8"  (1.727 m)   Wt 232 lb 14.4 oz (105.6 kg)   SpO2 97%   BMI 35.41 kg/m   Wt Readings from Last 3 Encounters:  10/13/18 232 lb 14.4 oz (105.6 kg)  09/20/18 224 lb 1.6 oz (101.7 kg)  09/10/18 226 lb 14.4 oz (102.9 kg)    Physical Exam Constitutional:      General: She is not in acute distress.    Appearance: She is well-developed. She is not diaphoretic.  HENT:     Head: Normocephalic and atraumatic.     Right Ear: Tympanic membrane and ear canal normal.     Left Ear: Ear canal normal. A middle ear effusion is present.     Nose: No rhinorrhea.     Mouth/Throat:     Mouth: Mucous membranes are moist.     Pharynx: No oropharyngeal exudate or posterior oropharyngeal erythema.  Eyes:     General: No scleral icterus. Neck:     Thyroid: No thyromegaly.  Cardiovascular:     Rate and Rhythm: Normal rate and regular rhythm.     Heart sounds: Normal heart sounds. No murmur.  Pulmonary:     Effort: Pulmonary effort is normal. No respiratory distress.     Breath sounds: Normal breath sounds. No wheezing.  Abdominal:     General: Bowel sounds are normal. There is no distension.     Palpations: Abdomen is soft.  Lymphadenopathy:     Cervical: No cervical adenopathy.  Skin:    General: Skin is warm and dry.     Coloration: Skin is not pale.  Neurological:     Mental Status: She is alert.  Psychiatric:        Mood and Affect: Mood is not anxious or depressed.        Behavior: Behavior normal.        Thought Content: Thought content normal.        Judgment: Judgment normal.    Results for orders placed or performed in visit on 09/20/18  Culture, Group A  Strep  Result Value Ref Range   MICRO NUMBER: [id number]    SPECIMEN QUALITY: Adequate    SOURCE: THROAT    STATUS: FINAL    RESULT: No group A Streptococcus isolated   POCT rapid strep A  Result Value Ref Range   Rapid Strep A Screen Negative Negative  POCT Influenza A/B  Result Value Ref Range   Influenza A, POC Negative Negative   Influenza B, POC Negative Negative      Assessment & Plan:   Problem List Items Addressed This Visit      Other   Estrogen deficiency    Order DEXA for health maintenance      Relevant Orders   DG Bone Density    Other Visit Diagnoses    Cough    -  Primary   will get CXR; start Levaquin, discussed risk of C diff, see AVS; to ER if getting worse   Relevant Orders   DG Chest 2 View (Completed)   Acute middle ear effusion, left       does not appear purulent; likely related to infection, inflammation; will be treating with antibiotics for lung infection   Relevant Medications   levofloxacin (LEVAQUIN) 500 MG tablet       Follow up plan: No follow-ups on file.  An after-visit summary was printed and given to the patient at [facility].  Please see the patient instructions which may contain other information and recommendations beyond what is mentioned above in the assessment and plan.  Meds ordered this encounter  Medications  . levofloxacin (LEVAQUIN) 500 MG tablet    Sig: Take 1 tablet (500 mg total) by mouth daily.    Dispense:  7 tablet    Refill:  0  . clopidogrel (PLAVIX) 75 MG tablet    Sig: Take 1 tablet (75 mg total) by mouth daily.    Dispense:  90 tablet    Refill:  3    Orders Placed This Encounter  Procedures  . DG Bone Density  . DG Chest 2 View

## 2018-10-13 NOTE — Patient Instructions (Addendum)
Use Mucinex DM and drink plenty of water through the day to keep secretions loose Please have the chest xray today Start the antibiotics Please do eat yogurt or kimchi or take a probiotic daily for the next month We want to replace the healthy germs in the gut If you notice foul, watery diarrhea in the next two months, schedule an appointment RIGHT AWAY or go to an urgent care or the emergency room if a holiday or over a weekend If you get feeling badly, please go to the emergency department  Please do call to schedule your bone density study; the number to schedule one at either [facility] or [facility] is [phone] or [phone]

## 2018-10-14 NOTE — Assessment & Plan Note (Signed)
Order DEXA for health maintenance

## 2018-10-21 ENCOUNTER — Other Ambulatory Visit: Payer: Self-pay | Admitting: Family Medicine

## 2018-10-21 DIAGNOSIS — E782 Mixed hyperlipidemia: Secondary | ICD-10-CM

## 2018-10-21 NOTE — Telephone Encounter (Signed)
Lab Results  Component Value Date   ALT 14 08/31/2018   Lab Results  Component Value Date   CHOL 134 08/31/2018   HDL 37 (L) 08/31/2018   LDLCALC 71 08/31/2018   TRIG 181 (H) 08/31/2018   CHOLHDL 3.6 08/31/2018   Lab Results  Component Value Date   CREATININE 0.78 08/31/2018   Lab Results  Component Value Date   K 4.5 08/31/2018

## 2018-10-28 DIAGNOSIS — G250 Essential tremor: Secondary | ICD-10-CM | POA: Diagnosis not present

## 2018-10-28 DIAGNOSIS — M545 Low back pain: Secondary | ICD-10-CM | POA: Diagnosis not present

## 2018-10-28 DIAGNOSIS — M542 Cervicalgia: Secondary | ICD-10-CM | POA: Diagnosis not present

## 2018-10-28 DIAGNOSIS — M797 Fibromyalgia: Secondary | ICD-10-CM | POA: Diagnosis not present

## 2018-10-28 DIAGNOSIS — Z79899 Other long term (current) drug therapy: Secondary | ICD-10-CM | POA: Diagnosis not present

## 2018-11-09 DIAGNOSIS — R0602 Shortness of breath: Secondary | ICD-10-CM | POA: Diagnosis not present

## 2018-11-09 DIAGNOSIS — I10 Essential (primary) hypertension: Secondary | ICD-10-CM | POA: Diagnosis not present

## 2018-11-09 DIAGNOSIS — R079 Chest pain, unspecified: Secondary | ICD-10-CM | POA: Diagnosis not present

## 2018-11-09 DIAGNOSIS — E7800 Pure hypercholesterolemia, unspecified: Secondary | ICD-10-CM | POA: Diagnosis not present

## 2018-11-09 DIAGNOSIS — I679 Cerebrovascular disease, unspecified: Secondary | ICD-10-CM | POA: Diagnosis not present

## 2018-11-09 DIAGNOSIS — I313 Pericardial effusion (noninflammatory): Secondary | ICD-10-CM | POA: Diagnosis not present

## 2018-12-17 ENCOUNTER — Other Ambulatory Visit: Payer: Self-pay | Admitting: Family Medicine

## 2018-12-17 DIAGNOSIS — F3341 Major depressive disorder, recurrent, in partial remission: Secondary | ICD-10-CM

## 2018-12-29 ENCOUNTER — Other Ambulatory Visit: Payer: Self-pay | Admitting: Internal Medicine

## 2019-01-17 DIAGNOSIS — R69 Illness, unspecified: Secondary | ICD-10-CM | POA: Diagnosis not present

## 2019-02-03 DIAGNOSIS — M545 Low back pain: Secondary | ICD-10-CM | POA: Diagnosis not present

## 2019-02-03 DIAGNOSIS — M797 Fibromyalgia: Secondary | ICD-10-CM | POA: Diagnosis not present

## 2019-02-03 DIAGNOSIS — Z79899 Other long term (current) drug therapy: Secondary | ICD-10-CM | POA: Diagnosis not present

## 2019-02-03 DIAGNOSIS — M542 Cervicalgia: Secondary | ICD-10-CM | POA: Diagnosis not present

## 2019-02-03 DIAGNOSIS — G250 Essential tremor: Secondary | ICD-10-CM | POA: Diagnosis not present

## 2019-02-03 DIAGNOSIS — G3184 Mild cognitive impairment, so stated: Secondary | ICD-10-CM | POA: Diagnosis not present

## 2019-02-16 ENCOUNTER — Other Ambulatory Visit: Payer: Self-pay | Admitting: Family Medicine

## 2019-02-16 DIAGNOSIS — Z1231 Encounter for screening mammogram for malignant neoplasm of breast: Secondary | ICD-10-CM

## 2019-02-23 DIAGNOSIS — K219 Gastro-esophageal reflux disease without esophagitis: Secondary | ICD-10-CM | POA: Diagnosis not present

## 2019-02-23 DIAGNOSIS — G629 Polyneuropathy, unspecified: Secondary | ICD-10-CM | POA: Diagnosis not present

## 2019-02-23 DIAGNOSIS — G8929 Other chronic pain: Secondary | ICD-10-CM | POA: Diagnosis not present

## 2019-02-23 DIAGNOSIS — I4891 Unspecified atrial fibrillation: Secondary | ICD-10-CM | POA: Diagnosis not present

## 2019-02-23 DIAGNOSIS — R32 Unspecified urinary incontinence: Secondary | ICD-10-CM | POA: Diagnosis not present

## 2019-02-23 DIAGNOSIS — Z7722 Contact with and (suspected) exposure to environmental tobacco smoke (acute) (chronic): Secondary | ICD-10-CM | POA: Diagnosis not present

## 2019-02-23 DIAGNOSIS — R69 Illness, unspecified: Secondary | ICD-10-CM | POA: Diagnosis not present

## 2019-02-23 DIAGNOSIS — E785 Hyperlipidemia, unspecified: Secondary | ICD-10-CM | POA: Diagnosis not present

## 2019-02-23 DIAGNOSIS — I10 Essential (primary) hypertension: Secondary | ICD-10-CM | POA: Diagnosis not present

## 2019-02-25 DIAGNOSIS — H2513 Age-related nuclear cataract, bilateral: Secondary | ICD-10-CM | POA: Diagnosis not present

## 2019-02-28 DIAGNOSIS — L820 Inflamed seborrheic keratosis: Secondary | ICD-10-CM | POA: Diagnosis not present

## 2019-03-02 HISTORY — PX: CATARACT EXTRACTION: SUR2

## 2019-03-06 ENCOUNTER — Telehealth: Payer: Medicare HMO | Admitting: Family

## 2019-03-06 DIAGNOSIS — R300 Dysuria: Secondary | ICD-10-CM

## 2019-03-06 MED ORDER — CEPHALEXIN 500 MG PO CAPS: 500.0000 mg | ORAL_CAPSULE | Freq: Two times a day (BID) | ORAL | 0 refills | Status: DC

## 2019-03-06 NOTE — Progress Notes (Signed)
We are sorry that you are not feeling well.  Here is how we plan to help!  Based on what you shared with me it looks like you most likely have a simple urinary tract infection.  A UTI (Urinary Tract Infection) is a bacterial infection of the bladder.  Most cases of urinary tract infections are simple to treat but a key part of your care is to encourage you to drink plenty of fluids and watch your symptoms carefully.  I have prescribed Keflex 500 mg twice a day for 7 days.  Your symptoms should gradually improve. Call us if the burning in your urine worsens, you develop worsening fever, back pain or pelvic pain or if your symptoms do not resolve after completing the antibiotic.  Urinary tract infections can be prevented by drinking plenty of water to keep your body hydrated.  Also be sure when you wipe, wipe from front to back and don't hold it in!  If possible, empty your bladder every 4 hours.  Your e-visit answers were reviewed by a board certified advanced clinical practitioner to complete your personal care plan.  Depending on the condition, your plan could have included both over the counter or prescription medications.  If there is a problem please reply  once you have received a response from your provider.  Your safety is important to us.  If you have drug allergies check your prescription carefully.    You can use MyChart to ask questions about today's visit, request a non-urgent call back, or ask for a work or school excuse for 24 hours related to this e-Visit. If it has been greater than 24 hours you will need to follow up with your provider, or enter a new e-Visit to address those concerns.   You will get an e-mail in the next two days asking about your experience.  I hope that your e-visit has been valuable and will speed your recovery. Thank you for using e-visits.   Greater than 5 minutes, yet less than 10 minutes of time have been spent researching, coordinating, and  implementing care for this patient today.  Thank you for the details you included in the comment boxes. Those details are very helpful in determining the best course of treatment for you and help us to provide the best care.   

## 2019-03-07 ENCOUNTER — Ambulatory Visit
Admission: EM | Admit: 2019-03-07 | Discharge: 2019-03-07 | Disposition: A | Discharge status: Home / Self Care | Payer: Medicare HMO | Attending: Urgent Care | Admitting: Urgent Care

## 2019-03-07 ENCOUNTER — Encounter: Payer: Self-pay | Admitting: Emergency Medicine

## 2019-03-07 ENCOUNTER — Other Ambulatory Visit: Payer: Self-pay

## 2019-03-07 DIAGNOSIS — N390 Urinary tract infection, site not specified: Secondary | ICD-10-CM | POA: Diagnosis not present

## 2019-03-07 DIAGNOSIS — R338 Other retention of urine: Secondary | ICD-10-CM

## 2019-03-07 LAB — URINALYSIS, COMPLETE (UACMP) WITH MICROSCOPIC
Bilirubin Urine: NEGATIVE
Glucose, UA: NEGATIVE mg/dL
Ketones, ur: NEGATIVE mg/dL
Nitrite: NEGATIVE
Protein, ur: 30 mg/dL — AB
Specific Gravity, Urine: 1.01 (ref 1.005–1.030)
WBC, UA: >50 WBC/hpf (ref 0–5)
pH: 6 (ref 5.0–8.0)

## 2019-03-07 NOTE — ED Provider Notes (Signed)
Robin Bryan, Robin Bryan   Name: Robin Bryan DOB: 10-13-1950 MRN: [id number] CSN: [id number] PCP: Robin Courser, MD  Arrival date and time:  03/07/19 1321  Chief Complaint:  Dysuria   NOTE: Prior to seeing the patient today, I have reviewed the triage nursing documentation and vital signs. Clinical staff has updated patient's PMH/PSHx, current medication list, and drug allergies/intolerances to ensure comprehensive history available to assist in medical decision making.   History:   HPI: Robin Bryan is a 68 y.o. female who presents today with complaints of acute urinary retention. Patient advising that she began to experience urinary tract infection symptoms last week. She took some over the counter Azo and drank cranberry juice for a few days, which resolved her symptoms. Patient was scheduled to go to the beach on vacation. In light of her resolved symptoms, patient decided to go on her trip. While at the beach, patient notes that she was unable to void x 3-4  days. Of note, patient wears incontinence products and reports that "some urine must have leaked out because the diaper was wet some". Soiled brief was not noted to be malodorous per her report Patient contacted PCP and was prescribed a 7 day course of cephalexin 500 mg BID, which she started yesterday (03/06/2019).   Patient presents today in significant pain. She states, "I hurt so bad that I cannot walk". Patient arrives to clinic in a wheelchair. Patient denies any associated fevers or back pain. She has significant suprapubic pain. She has a PMH significant for "urinary problems". Patient is followed by Dr. Irine Bryan. Patient advising that she has already been treated for two separate urinary tract infections this year.   Past Medical History:  Diagnosis Date  . Anxiety   . Arthritis   . Arthritis of both feet 11/26/2017   xrays March 2019  . Asthma   . Barrett's esophagus   . Bronchitis   . Calcaneal spur of both feet 11/26/2017    Foot xrays March 2019  . Depression   . Diverticulosis   . Esophageal stricture   . Fatty liver 09/08/2018   [place] Jan 2020  . Fibromyalgia   . GERD (gastroesophageal reflux disease)   . Hx of adenomatous colonic polyps   . Hyperlipidemia   . Hypertension   . Intramural leiomyoma of uterus   . Liver hemangioma 09/08/2018   [place] Jan 2020  . Myocardial infarction ([place])   . Stroke [facility])     Past Surgical History:  Procedure Laterality Date  . ABDOMINAL HYSTERECTOMY    . INCONTINENCE SURGERY    . TUBAL LIGATION    . tumor excision ovaries      Family History  Problem Relation Age of Onset  . Ovarian cancer Sister   . Cancer Sister   . Diabetes Sister   . Heart disease Sister   . Lung cancer Sister   . Colon polyps Mother   . Diabetes Mother   . Parkinson's disease Cousin   . Cancer Father        lung  . Diabetes Daughter   . Colon cancer Neg Hx   . Esophageal cancer Neg Hx   . Pancreatic cancer Neg Hx   . Stomach cancer Neg Hx     Social History   Tobacco Use  . Smoking status: Former Smoker    Packs/day: 0.50    Years: 8.00    Pack years: 4.00    Types: Cigarettes    Quit  date: 05/27/2011    Years since quitting: 7.7  . Smokeless tobacco: Never Used  . Tobacco comment: smoking cessation materials not required  Substance Use Topics  . Alcohol use: No  . Drug use: No    Patient Active Problem List   Diagnosis Date Noted  . Liver hemangioma 09/08/2018  . Fatty liver 09/08/2018  . Overactive bladder 03/19/2018  . Obesity (BMI 30.0-34.9) 02/25/2018  . Estrogen deficiency 01/08/2018  . Postmenopausal 01/07/2018  . Back pain 11/26/2017  . Chronic kidney disease, stage III (moderate) ([place]) 11/26/2017  . Calcaneal spur of both feet 11/26/2017  . Arthritis of both feet 11/26/2017  . Acute left-sided thoracic back pain 10/30/2015  . Temporary cerebral vascular dysfunction 07/16/2015  . Adaptation reaction 07/16/2015  . Foot pain 07/16/2015  . BP (high blood  pressure) 07/16/2015  . Acid reflux 07/16/2015  . Abnormal LFTs 07/16/2015  . Pericardial effusion 07/13/2015  . Mass of chest wall, left 06/29/2015  . Lipoma 06/29/2015  . Pure hypercholesterolemia 04/28/2015  . Gastro-esophageal reflux disease without esophagitis 04/28/2015  . Benign essential HTN 04/28/2015  . Hyperlipidemia 03/19/2015  . Elevated hemoglobin A1c 03/19/2015  . Neck muscle spasm 03/19/2015  . Cognitive change 02/07/2015  . Persistent dry cough 02/07/2015  . Major depression in partial remission ([place]) 02/07/2015  . Fibrositis 02/07/2015  . Anxiety disorder 02/07/2015  . History of anaphylaxis 02/07/2015  . Pubic bone pain 10/02/2014  . Mass of pelvis 06/05/2014  . Ovarian cyst, left 05/26/2014  . Abdominal pain, chronic, left lower quadrant 05/26/2014  . CEREBROVASCULAR DISEASE 04/23/2010  . COLONIC POLYPS, ADENOMATOUS 12/09/2006  . ESOPHAGEAL STRICTURE 12/09/2006  . GERD 12/09/2006  . BARRETTS ESOPHAGUS 12/09/2006  . DIVERTICULOSIS, COLON 12/09/2006    Home Medications:    Current Meds  Medication Sig  . cephALEXin (KEFLEX) 500 MG capsule Take 1 capsule (500 mg total) by mouth 2 (two) times daily.  . clopidogrel (PLAVIX) 75 MG tablet Take 1 tablet (75 mg total) by mouth daily.  . diphenhydrAMINE (BENADRYL) 25 mg capsule Take 1 capsule (25 mg total) by mouth every 8 (eight) hours as needed.  Marland Kitchen EPINEPHrine 0.3 mg/0.3 mL IJ SOAJ injection Inject 0.3 mLs (0.3 mg total) into the muscle once.  . escitalopram (LEXAPRO) 10 MG tablet TAKE 1 TABLET BY MOUTH EVERY DAY  . fluticasone (FLONASE) 50 MCG/ACT nasal spray Place 2 sprays into both nostrils daily.  Marland Kitchen gabapentin (NEURONTIN) 400 MG capsule Take 800 mg by mouth 4 (four) times daily.   Marland Kitchen loratadine (CLARITIN) 10 MG tablet Take 1 tablet (10 mg total) by mouth daily. If needed for allergies  . MYRBETRIQ 50 MG TB24 tablet TK 1 T PO QD  . pantoprazole (PROTONIX) 40 MG tablet TAKE 1 TABLET(40 MG) BY MOUTH TWICE DAILY  OFFICE VISIT FOR FURTHER REFILLS  . primidone (MYSOLINE) 50 MG tablet Take 1 tablet by mouth daily.  . propranolol ER (INDERAL LA) 60 MG 24 hr capsule Take 60 mg by mouth daily.   . rosuvastatin (CRESTOR) 10 MG tablet TAKE 1 TABLET BY MOUTH EVERYDAY AT BEDTIME  . solifenacin (VESICARE) 10 MG tablet Take 10 mg by mouth daily.  Marland Kitchen tiZANidine (ZANAFLEX) 4 MG capsule Take 4 mg by mouth 2 (two) times daily as needed for muscle spasms.    Allergies:   Shellfish allergy and Vicodin [hydrocodone-acetaminophen]  Review of Systems (ROS): Review of Systems  Constitutional: Negative for chills and fever.  Respiratory: Negative for cough and shortness of breath.  Cardiovascular: Negative for chest pain and palpitations.  Gastrointestinal: Positive for abdominal pain. Negative for diarrhea, nausea and vomiting.  Genitourinary: Positive for difficulty urinating and dysuria. Negative for flank pain, frequency, hematuria and urgency.  Musculoskeletal: Negative for back pain and myalgias.  Neurological: Negative for dizziness, syncope, weakness and headaches.  Hematological: Negative for adenopathy.  Psychiatric/Behavioral: The patient is nervous/anxious.      Vital Signs: Today's Vitals   03/07/19 1347 03/07/19 1351 03/07/19 1544  BP: 132/65    Pulse: 73    Resp: 18    Temp: 98.2 F (36.8 C)    TempSrc: Oral    SpO2: 97%    Weight:  235 lb (106.6 kg)   Height:  5\' 8"  (1.727 m)   PainSc:  10-Worst pain ever 0-No pain    Physical Exam: Physical Exam  Constitutional: She is oriented to person, place, and time and well-developed, well-nourished, and in no distress. She appears to be writhing in pain.  HENT:  Head: Normocephalic and atraumatic.  Mouth/Throat: Mucous membranes are normal.  Eyes: Pupils are equal, round, and reactive to light. EOM are normal.  Neck: Normal range of motion. Neck supple. No tracheal deviation present.  Cardiovascular: Normal rate, regular rhythm, normal heart  sounds and intact distal pulses. Exam reveals no gallop and no friction rub.  No murmur heard. Pulmonary/Chest: Effort normal and breath sounds normal. No respiratory distress. She has no wheezes. She has no rales.  Abdominal: Soft. Bowel sounds are normal. She exhibits distension. There is no hepatosplenomegaly. There is generalized abdominal tenderness. There is guarding and CVA tenderness. There is no rebound.  Lymphadenopathy:    She has no cervical adenopathy.  Neurological: She is alert and oriented to person, place, and time. Gait normal. GCS score is 15.  Skin: Skin is warm and dry. No rash noted.  Psychiatric: Memory, affect and judgment normal. Her mood appears anxious.  Nursing note and vitals reviewed.   Urgent Care Treatments / Results:   LABS: PLEASE NOTE: all labs that were ordered this encounter are listed, however only abnormal results are displayed. URINALYSIS, COMPLETE (UACMP) WITH MICROSCOPIC - Abnormal; Notable for the following components:   APPearance CLOUDY (*)    Hgb urine dipstick LARGE (*)    Protein, ur 30 (*)    Leukocytes,Ua LARGE (*)    Bacteria, UA MANY (*)    All other components within normal limits    EKG: -None  RADIOLOGY: No results found.  PROCEDURES: Procedures  MEDICATIONS RECEIVED THIS VISIT: Medications - No data to display  PERTINENT CLINICAL COURSE NOTES/UPDATES:   Initial Impression / Assessment and Plan / Urgent Care Course:  Pertinent labs & imaging results that were available during my care of the patient were personally reviewed by me and considered in my medical decision making (see lab/imaging section of note for values and interpretations).  SVEA PUSCH is a 68 y.o. female who presents to [facility] today with complaints of Dysuria   Patient presents to the clinic in obvious discomfort. She notes that she is unable to walk because of her abdominal and bladder pain. Exam reveals distention overlying the urinary  bladder. UC setting does not have bladder scanning capabilities. Relying on clinical exam and patient's report of inability to void x 3-4 days, decision made to insert Foley catheter. Catheter placed by clinic RN; 1550 cc cloudy malodorous urine returned. Patient reported immediate relief of her pain.   Sample for UA sent for testing as  no original testing done prior to patient starting antimicrobial therapy. Need to determine potential for uropathogen resistance. UA was (+) for infection; reflex culture sent. Will continue current cephalexin course. Patient encouraged to complete the entire course of antibiotics even if she begins to feel better. She was advised that if culture demonstrates resistance to the prescribed antibiotic, she will be contacted and advised of the need to change the antibiotic being used to treat her infection. Patient encouraged to increase her fluid intake as much as possible. Discussed that water is always best to flush the urinary tract. She was advised to avoid caffeine containing fluids until her infections clears, as caffeine can cause her to experience painful bladder spasms.  May use Tylenol and/or Ibuprofen as needed for pain/fever. May also use over the counter Azo products to help relieve her current urinary pain. Foley to be left in place; reviewed safety considerations. She is aware that bladder spasms can occur because of the catheter as well. Appointment made with urologist Jeffie Pollock, MD) tomorrow (03/08/2019) at 1415 for evaluation and discussion regarding continued use of urinary catheter.  I have reviewed the follow up and strict return precautions for any new or worsening symptoms. Patient is aware of symptoms that would be deemed urgent/emergent, and would thus require further evaluation either here or in the emergency department. At the time of discharge, she verbalized understanding and consent with the discharge plan as it was reviewed with her. All questions were  fielded by provider and/or clinic staff prior to patient discharge.    Final Clinical Impressions / Urgent Care Diagnoses:   Final diagnoses:  Acute urinary retention  Urinary tract infection without hematuria, site unspecified    New Prescriptions:  [place] Controlled Substance Registry consulted? Not Applicable  No orders of the defined types were placed in this encounter.   Recommended Follow up Care:  Patient encouraged to follow up with the following provider within the specified time frame, or sooner as dictated by the severity of her symptoms. As always, she was instructed that for any urgent/emergent care needs, she should seek care either here or in the emergency department for more immediate evaluation. Follow-up Information    Robin Seal, MD.   Specialty: Urology Why: Need to be seen as scheduled TOMORROW. Contact information: [place] [address] [phone]         NOTE: This note was prepared using Dragon dictation software along with smaller phrase technology. Despite my best ability to proofread, there is the potential that transcriptional errors may still occur from this process, and are completely unintentional.     Karen Kitchens, NP 03/09/19 [phone]

## 2019-03-07 NOTE — Discharge Instructions (Signed)
It was very nice seeing you today in clinic. Thank you for entrusting me with your care.   As discussed, your urine is POSITIVE for infection. Will approach treatment as follows:  Continue current prescription for KEFLEX.  Take as directed. FINISH the entire course of medication even if you are feeling better.  A culture will be sent on your provided sample. If it comes back resistant to what I have prescribed you, someone will call you and let you know that we will need to change antibiotics. Increase fluid intake as much as possible to flush your urinary tract.  Water is always the best.  Avoid caffeine until your infection clears up, as it can contribute to painful bladder spasms.  May use Tylenol and/or Ibuprofen as needed for pain/fever. May use Azo products (over the counter) to help with pain/discomfort.   Make arrangements to follow up with your regular doctor in 1 week for re-evaluation. If your symptoms/condition worsens, please seek follow up care either here or in the ER. Please remember, our [facility] providers are "right here with you" when you need [place].   Again, it was my pleasure to take care of you today. Thank you for choosing our clinic. I hope that you start to feel better quickly.   Honor Loh, MSN, APRN, FNP-C, CEN Advanced Practice Provider [facility]

## 2019-03-07 NOTE — ED Triage Notes (Signed)
Patient states she has not been able to urinate for 4 days and is in so much pain she can barely walk

## 2019-03-08 DIAGNOSIS — R338 Other retention of urine: Secondary | ICD-10-CM | POA: Diagnosis not present

## 2019-03-08 LAB — URINE CULTURE: Culture: <10000 — AB

## 2019-03-09 DIAGNOSIS — R69 Illness, unspecified: Secondary | ICD-10-CM | POA: Diagnosis not present

## 2019-03-10 DIAGNOSIS — H2513 Age-related nuclear cataract, bilateral: Secondary | ICD-10-CM | POA: Diagnosis not present

## 2019-03-10 DIAGNOSIS — H25013 Cortical age-related cataract, bilateral: Secondary | ICD-10-CM | POA: Diagnosis not present

## 2019-03-10 DIAGNOSIS — N3941 Urge incontinence: Secondary | ICD-10-CM | POA: Diagnosis not present

## 2019-03-10 DIAGNOSIS — N3281 Overactive bladder: Secondary | ICD-10-CM | POA: Diagnosis not present

## 2019-03-10 DIAGNOSIS — H2511 Age-related nuclear cataract, right eye: Secondary | ICD-10-CM | POA: Diagnosis not present

## 2019-03-10 DIAGNOSIS — H40033 Anatomical narrow angle, bilateral: Secondary | ICD-10-CM | POA: Diagnosis not present

## 2019-03-10 DIAGNOSIS — H40013 Open angle with borderline findings, low risk, bilateral: Secondary | ICD-10-CM | POA: Diagnosis not present

## 2019-03-10 DIAGNOSIS — R338 Other retention of urine: Secondary | ICD-10-CM | POA: Diagnosis not present

## 2019-03-11 DIAGNOSIS — R338 Other retention of urine: Secondary | ICD-10-CM | POA: Diagnosis not present

## 2019-03-14 ENCOUNTER — Other Ambulatory Visit: Payer: Self-pay | Admitting: Family Medicine

## 2019-03-14 DIAGNOSIS — F3341 Major depressive disorder, recurrent, in partial remission: Secondary | ICD-10-CM

## 2019-03-14 NOTE — Telephone Encounter (Signed)
lvm to sch apt and about meds called in

## 2019-03-14 NOTE — Telephone Encounter (Signed)
Please schedule routine follow-up in the next 3 months  

## 2019-03-15 ENCOUNTER — Encounter: Payer: Self-pay | Admitting: Family Medicine

## 2019-03-17 DIAGNOSIS — R69 Illness, unspecified: Secondary | ICD-10-CM | POA: Diagnosis not present

## 2019-03-22 ENCOUNTER — Other Ambulatory Visit: Payer: Self-pay | Admitting: Internal Medicine

## 2019-03-22 DIAGNOSIS — H2511 Age-related nuclear cataract, right eye: Secondary | ICD-10-CM | POA: Diagnosis not present

## 2019-03-22 DIAGNOSIS — H25811 Combined forms of age-related cataract, right eye: Secondary | ICD-10-CM | POA: Diagnosis not present

## 2019-03-23 ENCOUNTER — Encounter: Payer: Self-pay | Admitting: Family Medicine

## 2019-03-24 ENCOUNTER — Ambulatory Visit: Payer: Medicare HMO | Admitting: Family Medicine

## 2019-03-28 DIAGNOSIS — N3941 Urge incontinence: Secondary | ICD-10-CM | POA: Diagnosis not present

## 2019-03-28 DIAGNOSIS — Z87440 Personal history of urinary (tract) infections: Secondary | ICD-10-CM | POA: Diagnosis not present

## 2019-03-28 DIAGNOSIS — R338 Other retention of urine: Secondary | ICD-10-CM | POA: Diagnosis not present

## 2019-03-28 DIAGNOSIS — N3281 Overactive bladder: Secondary | ICD-10-CM | POA: Diagnosis not present

## 2019-03-31 DIAGNOSIS — H2511 Age-related nuclear cataract, right eye: Secondary | ICD-10-CM | POA: Diagnosis not present

## 2019-04-02 ENCOUNTER — Other Ambulatory Visit: Payer: Self-pay | Admitting: Nurse Practitioner

## 2019-04-02 DIAGNOSIS — F3341 Major depressive disorder, recurrent, in partial remission: Secondary | ICD-10-CM

## 2019-04-06 ENCOUNTER — Other Ambulatory Visit: Payer: Self-pay | Admitting: Family Medicine

## 2019-04-13 ENCOUNTER — Other Ambulatory Visit: Payer: Self-pay | Admitting: Family Medicine

## 2019-04-13 ENCOUNTER — Other Ambulatory Visit: Payer: Self-pay | Admitting: Internal Medicine

## 2019-04-13 DIAGNOSIS — E782 Mixed hyperlipidemia: Secondary | ICD-10-CM

## 2019-04-14 ENCOUNTER — Telehealth: Payer: Self-pay | Admitting: Internal Medicine

## 2019-04-14 NOTE — Telephone Encounter (Signed)
Pt needs rf for pantoprazole sent to [facility] in [place].

## 2019-04-15 ENCOUNTER — Other Ambulatory Visit: Payer: Self-pay | Admitting: Internal Medicine

## 2019-04-15 NOTE — Telephone Encounter (Signed)
Patient needs office visit at least scheduled for further refills

## 2019-04-17 ENCOUNTER — Telehealth: Payer: Self-pay | Admitting: Family Medicine

## 2019-04-17 ENCOUNTER — Other Ambulatory Visit: Payer: Self-pay | Admitting: Nurse Practitioner

## 2019-04-17 DIAGNOSIS — R0981 Nasal congestion: Secondary | ICD-10-CM

## 2019-04-17 DIAGNOSIS — J069 Acute upper respiratory infection, unspecified: Secondary | ICD-10-CM

## 2019-04-17 DIAGNOSIS — E782 Mixed hyperlipidemia: Secondary | ICD-10-CM

## 2019-04-18 DIAGNOSIS — H25012 Cortical age-related cataract, left eye: Secondary | ICD-10-CM | POA: Diagnosis not present

## 2019-04-18 DIAGNOSIS — H2512 Age-related nuclear cataract, left eye: Secondary | ICD-10-CM | POA: Diagnosis not present

## 2019-04-18 NOTE — Telephone Encounter (Signed)
Pt calling to find out why this wasn't refilled.  Pt states that she is completely out of the medication.

## 2019-04-18 NOTE — Telephone Encounter (Signed)
Pt is scheduled 05/30/19 OV and requested a refill for pantoprazole.

## 2019-04-19 ENCOUNTER — Telehealth: Payer: Self-pay | Admitting: Internal Medicine

## 2019-04-19 DIAGNOSIS — R350 Frequency of micturition: Secondary | ICD-10-CM | POA: Diagnosis not present

## 2019-04-19 DIAGNOSIS — N3281 Overactive bladder: Secondary | ICD-10-CM | POA: Diagnosis not present

## 2019-04-19 DIAGNOSIS — N3941 Urge incontinence: Secondary | ICD-10-CM | POA: Diagnosis not present

## 2019-04-19 MED ORDER — PANTOPRAZOLE SODIUM 40 MG PO TBEC: DELAYED_RELEASE_TABLET | ORAL | 0 refills | Status: DC

## 2019-04-19 NOTE — Telephone Encounter (Signed)
Refilled Pantoprazole 

## 2019-04-19 NOTE — Telephone Encounter (Signed)
Pt complained that she has been out of pantoprazole for two weeks.

## 2019-04-20 MED ORDER — FLUTICASONE PROPIONATE 50 MCG/ACT NA SUSP: 2.0000 | Freq: Every day | NASAL | 2 refills | Status: DC

## 2019-04-20 NOTE — Addendum Note (Signed)
Addended by: Fredderick Severance on: 04/20/2019 10:11 AM   Modules accepted: Orders

## 2019-04-20 NOTE — Telephone Encounter (Signed)
Please schedule patient for routine appointment within 3 months. Diagnosis used was an infection from February. Reordered.

## 2019-04-22 ENCOUNTER — Other Ambulatory Visit: Payer: Self-pay

## 2019-04-22 ENCOUNTER — Encounter: Payer: Self-pay | Admitting: Family Medicine

## 2019-04-25 ENCOUNTER — Telehealth: Payer: Self-pay

## 2019-04-25 ENCOUNTER — Other Ambulatory Visit: Payer: Self-pay

## 2019-04-25 MED ORDER — PANTOPRAZOLE SODIUM 40 MG PO TBEC: DELAYED_RELEASE_TABLET | ORAL | 0 refills | Status: DC

## 2019-04-25 NOTE — Telephone Encounter (Signed)
Spoke with pharmacist who told me that the directions on how to take medication needed to be in the signature line.  I typed in "take one tablet by mouth twice a day and then sent the medication again.

## 2019-04-26 ENCOUNTER — Encounter: Payer: Self-pay | Admitting: Family Medicine

## 2019-04-26 DIAGNOSIS — H2512 Age-related nuclear cataract, left eye: Secondary | ICD-10-CM | POA: Diagnosis not present

## 2019-04-26 DIAGNOSIS — H25812 Combined forms of age-related cataract, left eye: Secondary | ICD-10-CM | POA: Diagnosis not present

## 2019-04-26 DIAGNOSIS — H25012 Cortical age-related cataract, left eye: Secondary | ICD-10-CM | POA: Diagnosis not present

## 2019-04-26 NOTE — Telephone Encounter (Signed)
Called pt to schedule an appt and left a message with her husband. Pt is currently in surgery

## 2019-05-03 ENCOUNTER — Ambulatory Visit
Admission: RE | Admit: 2019-05-03 | Discharge: 2019-05-03 | Disposition: A | Discharge status: Home / Self Care | Payer: Medicare HMO | Source: Ambulatory Visit | Attending: Family Medicine | Admitting: Family Medicine

## 2019-05-03 ENCOUNTER — Other Ambulatory Visit: Payer: Self-pay

## 2019-05-03 DIAGNOSIS — Z13820 Encounter for screening for osteoporosis: Secondary | ICD-10-CM | POA: Diagnosis not present

## 2019-05-03 DIAGNOSIS — Z1231 Encounter for screening mammogram for malignant neoplasm of breast: Secondary | ICD-10-CM | POA: Diagnosis not present

## 2019-05-03 DIAGNOSIS — H2512 Age-related nuclear cataract, left eye: Secondary | ICD-10-CM | POA: Diagnosis not present

## 2019-05-03 DIAGNOSIS — Z780 Asymptomatic menopausal state: Secondary | ICD-10-CM | POA: Diagnosis not present

## 2019-05-03 DIAGNOSIS — E2839 Other primary ovarian failure: Secondary | ICD-10-CM

## 2019-05-11 DIAGNOSIS — I313 Pericardial effusion (noninflammatory): Secondary | ICD-10-CM | POA: Diagnosis not present

## 2019-05-11 DIAGNOSIS — R338 Other retention of urine: Secondary | ICD-10-CM | POA: Diagnosis not present

## 2019-05-11 DIAGNOSIS — N3281 Overactive bladder: Secondary | ICD-10-CM | POA: Diagnosis not present

## 2019-05-11 DIAGNOSIS — E7800 Pure hypercholesterolemia, unspecified: Secondary | ICD-10-CM | POA: Diagnosis not present

## 2019-05-11 DIAGNOSIS — I10 Essential (primary) hypertension: Secondary | ICD-10-CM | POA: Diagnosis not present

## 2019-05-11 DIAGNOSIS — N3941 Urge incontinence: Secondary | ICD-10-CM | POA: Diagnosis not present

## 2019-05-11 DIAGNOSIS — R0602 Shortness of breath: Secondary | ICD-10-CM | POA: Diagnosis not present

## 2019-05-12 DIAGNOSIS — G250 Essential tremor: Secondary | ICD-10-CM | POA: Diagnosis not present

## 2019-05-12 DIAGNOSIS — Z79899 Other long term (current) drug therapy: Secondary | ICD-10-CM | POA: Diagnosis not present

## 2019-05-12 DIAGNOSIS — G3184 Mild cognitive impairment, so stated: Secondary | ICD-10-CM | POA: Diagnosis not present

## 2019-05-12 DIAGNOSIS — M542 Cervicalgia: Secondary | ICD-10-CM | POA: Diagnosis not present

## 2019-05-12 DIAGNOSIS — M545 Low back pain: Secondary | ICD-10-CM | POA: Diagnosis not present

## 2019-05-17 ENCOUNTER — Other Ambulatory Visit: Payer: Self-pay

## 2019-05-17 ENCOUNTER — Ambulatory Visit (INDEPENDENT_AMBULATORY_CARE_PROVIDER_SITE_OTHER): Payer: Medicare HMO

## 2019-05-17 VITALS — Ht 68.0 in | Wt 230.0 lb

## 2019-05-17 DIAGNOSIS — Z0000 Encounter for general adult medical examination without abnormal findings: Secondary | ICD-10-CM | POA: Diagnosis not present

## 2019-05-17 NOTE — Patient Instructions (Signed)
Robin Bryan , Thank you for taking time to come for your Medicare Wellness Visit. I appreciate your ongoing commitment to your health goals. Please review the following plan we discussed and let me know if I can assist you in the future.   Screening recommendations/referrals: Colonoscopy: done 03/18/17. Repeat in 2023. Mammogram: done 05/03/19 Bone Density: done 05/03/19 Recommended yearly ophthalmology/optometry visit for glaucoma screening and checkup Recommended yearly dental visit for hygiene and checkup  Vaccinations: Influenza vaccine: done 05/13/18 Pneumococcal vaccine: done 08/31/18 Tdap vaccine: done 04/03/17 Shingles vaccine: Shingrix discussed. Please contact your pharmacy for coverage information.   Advanced directives: Advance directive discussed with you today. I have provided a copy for you to complete at home and have notarized. Once this is complete please bring a copy in to our office so we can scan it into your chart.  Conditions/risks identified: Recommend increasing physical activity to at least 3 days per week  Next appointment: Please follow up in one year for your Medicare Annual Wellness visit.     Preventive Care 38 Years and Older, Female Preventive care refers to lifestyle choices and visits with your health care provider that can promote health and wellness. What does preventive care include?  A yearly physical exam. This is also called an annual well check.  Dental exams once or twice a year.  Routine eye exams. Ask your health care provider how often you should have your eyes checked.  Personal lifestyle choices, including:  Daily care of your teeth and gums.  Regular physical activity.  Eating a healthy diet.  Avoiding tobacco and drug use.  Limiting alcohol use.  Practicing safe sex.  Taking low-dose aspirin every day.  Taking vitamin and mineral supplements as recommended by your health care provider. What happens during an annual well check?  The services and screenings done by your health care provider during your annual well check will depend on your age, overall health, lifestyle risk factors, and family history of disease. Counseling  Your health care provider may ask you questions about your:  Alcohol use.  Tobacco use.  Drug use.  Emotional well-being.  Home and relationship well-being.  Sexual activity.  Eating habits.  History of falls.  Memory and ability to understand (cognition).  Work and work Statistician.  Reproductive health. Screening  You may have the following tests or measurements:  Height, weight, and BMI.  Blood pressure.  Lipid and cholesterol levels. These may be checked every 5 years, or more frequently if you are over 53 years old.  Skin check.  Lung cancer screening. You may have this screening every year starting at age 70 if you have a 30-pack-year history of smoking and currently smoke or have quit within the past 15 years.  Fecal occult blood test (FOBT) of the stool. You may have this test every year starting at age 107.  Flexible sigmoidoscopy or colonoscopy. You may have a sigmoidoscopy every 5 years or a colonoscopy every 10 years starting at age 57.  Hepatitis C blood test.  Hepatitis B blood test.  Sexually transmitted disease (STD) testing.  Diabetes screening. This is done by checking your blood sugar (glucose) after you have not eaten for a while (fasting). You may have this done every 1-3 years.  Bone density scan. This is done to screen for osteoporosis. You may have this done starting at age 53.  Mammogram. This may be done every 1-2 years. Talk to your health care provider about how often you should  have regular mammograms. Talk with your health care provider about your test results, treatment options, and if necessary, the need for more tests. Vaccines  Your health care provider may recommend certain vaccines, such as:  Influenza vaccine. This is  recommended every year.  Tetanus, diphtheria, and acellular pertussis (Tdap, Td) vaccine. You may need a Td booster every 10 years.  Zoster vaccine. You may need this after age 64.  Pneumococcal 13-valent conjugate (PCV13) vaccine. One dose is recommended after age 59.  Pneumococcal polysaccharide (PPSV23) vaccine. One dose is recommended after age 72. Talk to your health care provider about which screenings and vaccines you need and how often you need them. This information is not intended to replace advice given to you by your health care provider. Make sure you discuss any questions you have with your health care provider. Document Released: 09/14/2015 Document Revised: 05/07/2016 Document Reviewed: 06/19/2015 Elsevier Interactive Patient Education  2017 [place] Prevention in the Home Falls can cause injuries. They can happen to people of all ages. There are many things you can do to make your home safe and to help prevent falls. What can I do on the outside of my home?  Regularly fix the edges of walkways and driveways and fix any cracks.  Remove anything that might make you trip as you walk through a door, such as a raised step or threshold.  Trim any bushes or trees on the path to your home.  Use bright outdoor lighting.  Clear any walking paths of anything that might make someone trip, such as rocks or tools.  Regularly check to see if handrails are loose or broken. Make sure that both sides of any steps have handrails.  Any raised decks and porches should have guardrails on the edges.  Have any leaves, snow, or ice cleared regularly.  Use sand or salt on walking paths during winter.  Clean up any spills in your garage right away. This includes oil or grease spills. What can I do in the bathroom?  Use night lights.  Install grab bars by the toilet and in the tub and shower. Do not use towel bars as grab bars.  Use non-skid mats or decals in the tub or  shower.  If you need to sit down in the shower, use a plastic, non-slip stool.  Keep the floor dry. Clean up any water that spills on the floor as soon as it happens.  Remove soap buildup in the tub or shower regularly.  Attach bath mats securely with double-sided non-slip rug tape.  Do not have throw rugs and other things on the floor that can make you trip. What can I do in the bedroom?  Use night lights.  Make sure that you have a light by your bed that is easy to reach.  Do not use any sheets or blankets that are too big for your bed. They should not hang down onto the floor.  Have a firm chair that has side arms. You can use this for support while you get dressed.  Do not have throw rugs and other things on the floor that can make you trip. What can I do in the kitchen?  Clean up any spills right away.  Avoid walking on wet floors.  Keep items that you use a lot in easy-to-reach places.  If you need to reach something above you, use a strong step stool that has a grab bar.  Keep electrical cords out of  the way.  Do not use floor polish or wax that makes floors slippery. If you must use wax, use non-skid floor wax.  Do not have throw rugs and other things on the floor that can make you trip. What can I do with my stairs?  Do not leave any items on the stairs.  Make sure that there are handrails on both sides of the stairs and use them. Fix handrails that are broken or loose. Make sure that handrails are as long as the stairways.  Check any carpeting to make sure that it is firmly attached to the stairs. Fix any carpet that is loose or worn.  Avoid having throw rugs at the top or bottom of the stairs. If you do have throw rugs, attach them to the floor with carpet tape.  Make sure that you have a light switch at the top of the stairs and the bottom of the stairs. If you do not have them, ask someone to add them for you. What else can I do to help prevent falls?   Wear shoes that:  Do not have high heels.  Have rubber bottoms.  Are comfortable and fit you well.  Are closed at the toe. Do not wear sandals.  If you use a stepladder:  Make sure that it is fully opened. Do not climb a closed stepladder.  Make sure that both sides of the stepladder are locked into place.  Ask someone to hold it for you, if possible.  Clearly mark and make sure that you can see:  Any grab bars or handrails.  First and last steps.  Where the edge of each step is.  Use tools that help you move around (mobility aids) if they are needed. These include:  Canes.  Walkers.  Scooters.  Crutches.  Turn on the lights when you go into a dark area. Replace any light bulbs as soon as they burn out.  Set up your furniture so you have a clear path. Avoid moving your furniture around.  If any of your floors are uneven, fix them.  If there are any pets around you, be aware of where they are.  Review your medicines with your doctor. Some medicines can make you feel dizzy. This can increase your chance of falling. Ask your doctor what other things that you can do to help prevent falls. This information is not intended to replace advice given to you by your health care provider. Make sure you discuss any questions you have with your health care provider. Document Released: 06/14/2009 Document Revised: 01/24/2016 Document Reviewed: 09/22/2014 Elsevier Interactive Patient Education  2017 [employer].

## 2019-05-17 NOTE — Progress Notes (Signed)
Subjective:   Robin Bryan is a 68 y.o. female who presents for Medicare Annual (Subsequent) preventive examination.  Virtual Visit via Telephone Note  I connected with Trina Ao on 05/17/19 at 10:40 AM EDT by telephone and verified that I am speaking with the correct person using two identifiers.  Medicare Annual Wellness visit completed telephonically due to Covid-19 pandemic.   Location: Patient: home Provider: office   I discussed the limitations, risks, security and privacy concerns of performing an evaluation and management service by telephone and the availability of in person appointments. The patient expressed understanding and agreed to proceed.  Some vital signs may be absent or patient reported.   Clemetine Marker, LPN   Review of Systems:   Cardiac Risk Factors include: advanced age (>2men, >33 women);hypertension;dyslipidemia;obesity (BMI >30kg/m2)     Objective:     Vitals: Ht 5\' 8"  (1.727 m)   Wt 230 lb (104.3 kg)   BMI 34.97 kg/m   Body mass index is 34.97 kg/m.  Advanced Directives 05/17/2019 05/13/2018 05/12/2017 04/03/2017 02/26/2017 02/16/2017 01/01/2017  Does Patient Have a Medical Advance Directive? No No No No No No No  Would patient like information on creating a medical advance directive? Yes (MAU/Ambulatory/Procedural Areas - Information given) Yes (MAU/Ambulatory/Procedural Areas - Information given) - - - - -    Tobacco Social History   Tobacco Use  Smoking Status Former Smoker  . Packs/day: 0.50  . Years: 8.00  . Pack years: 4.00  . Types: Cigarettes  . Quit date: 05/27/2011  . Years since quitting: 7.9  Smokeless Tobacco Never Used  Tobacco Comment   smoking cessation materials not required     Counseling given: Not Answered Comment: smoking cessation materials not required   Clinical Intake:  Pre-visit preparation completed: Yes  Pain : No/denies pain     BMI - recorded: 34.97 Nutritional Status: BMI > 30  Obese  Nutritional Risks: None Diabetes: No  How often do you need to have someone help you when you read instructions, pamphlets, or other written materials from your doctor or pharmacy?: 1 - Never  Interpreter Needed?: No  Information entered by :: Clemetine Marker LPN  Past Medical History:  Diagnosis Date  . Allergy   . Anxiety   . Arthritis   . Arthritis of both feet 11/26/2017   xrays March 2019  . Asthma   . Barrett's esophagus   . Bronchitis   . Calcaneal spur of both feet 11/26/2017   Foot xrays March 2019  . Depression   . Diverticulosis   . Esophageal stricture   . Fatty liver 09/08/2018   [place] Jan 2020  . Fibromyalgia   . GERD (gastroesophageal reflux disease)   . Hx of adenomatous colonic polyps   . Hyperlipidemia   . Hypertension   . Intramural leiomyoma of uterus   . Liver hemangioma 09/08/2018   [place] Jan 2020  . Myocardial infarction ([place])   . Stroke [facility])    Past Surgical History:  Procedure Laterality Date  . ABDOMINAL HYSTERECTOMY    . INCONTINENCE SURGERY    . TUBAL LIGATION    . tumor excision ovaries     Family History  Problem Relation Age of Onset  . Ovarian cancer Sister   . Cancer Sister   . Diabetes Sister   . Heart disease Sister   . Lung cancer Sister   . Atrial fibrillation Sister   . Colon polyps Mother   . Diabetes Mother   .  Parkinson's disease Cousin   . Cancer Father        lung  . Lung cancer Father   . Diabetes Daughter   . Colon cancer Neg Hx   . Esophageal cancer Neg Hx   . Pancreatic cancer Neg Hx   . Stomach cancer Neg Hx    Social History   Socioeconomic History  . Marital status: Married    Spouse name: Orpah Greek  . Number of children: 2  . Years of education: some collee  . Highest education level: 12th grade  Occupational History  . Occupation: Retired  Scientific laboratory technician  . Financial resource strain: Not hard at all  . Food insecurity    Worry: Never true    Inability: Never true  . Transportation needs    Medical: No     Non-medical: No  Tobacco Use  . Smoking status: Former Smoker    Packs/day: 0.50    Years: 8.00    Pack years: 4.00    Types: Cigarettes    Quit date: 05/27/2011    Years since quitting: 7.9  . Smokeless tobacco: Never Used  . Tobacco comment: smoking cessation materials not required  Substance and Sexual Activity  . Alcohol use: No  . Drug use: No  . Sexual activity: Yes    Birth control/protection: Post-menopausal  Lifestyle  . Physical activity    Days per week: 0 days    Minutes per session: 0 min  . Stress: To some extent  Relationships  . Social Herbalist on phone: Patient refused    Gets together: Patient refused    Attends religious service: Patient refused    Active member of club or organization: Patient refused    Attends meetings of clubs or organizations: Patient refused    Relationship status: Married  Other Topics Concern  . Not on file  Social History Narrative  . Not on file    Outpatient Encounter Medications as of 05/17/2019  Medication Sig  . clopidogrel (PLAVIX) 75 MG tablet Take 1 tablet (75 mg total) by mouth daily.  . diphenhydrAMINE (BENADRYL) 25 mg capsule Take 1 capsule (25 mg total) by mouth every 8 (eight) hours as needed.  Marland Kitchen EPINEPHrine 0.3 mg/0.3 mL IJ SOAJ injection Inject 0.3 mLs (0.3 mg total) into the muscle once.  . escitalopram (LEXAPRO) 10 MG tablet TAKE 1 TABLET BY MOUTH EVERY DAY  . fluticasone (FLONASE) 50 MCG/ACT nasal spray Place 2 sprays into both nostrils daily.  Marland Kitchen gabapentin (NEURONTIN) 400 MG capsule Take 800 mg by mouth 4 (four) times daily.   Marland Kitchen MYRBETRIQ 50 MG TB24 tablet TK 1 T PO QD  . oxyCODONE-acetaminophen (PERCOCET/ROXICET) 5-325 MG tablet Take 1 tablet by mouth every 6 (six) hours as needed. for pain  . pantoprazole (PROTONIX) 40 MG tablet Take one tablet by mouth twice a day;  Patient needs to keep upcoming office visit for further refills  . primidone (MYSOLINE) 50 MG tablet Take 1 tablet by mouth daily.   . propranolol ER (INDERAL LA) 60 MG 24 hr capsule Take 60 mg by mouth daily.   . rosuvastatin (CRESTOR) 10 MG tablet TAKE 1 TABLET BY MOUTH EVERYDAY AT BEDTIME  . tiZANidine (ZANAFLEX) 4 MG tablet Take 4-8 mg by mouth at bedtime.  Marland Kitchen UNABLE TO FIND APPLY 1 2 GMS TO AFFECTED AREA 3 4 TIMES DAILY AS NEEDED FOR PAIN  THIS IS A COMPOUNDED CREAM 1 PUMP EQUALS 1 GRAM  . valsartan (DIOVAN) 80  MG tablet TAKE 1 TABLET BY MOUTH EVERY DAY  . [DISCONTINUED] cephALEXin (KEFLEX) 500 MG capsule Take 1 capsule (500 mg total) by mouth 2 (two) times daily.  . [DISCONTINUED] HYDROcodone-acetaminophen (NORCO/VICODIN) 5-325 MG tablet Take 1 tablet by mouth every 6 (six) hours as needed for moderate pain.  . [DISCONTINUED] loratadine (CLARITIN) 10 MG tablet Take 1 tablet (10 mg total) by mouth daily. If needed for allergies  . [DISCONTINUED] solifenacin (VESICARE) 10 MG tablet Take 10 mg by mouth daily.  . [DISCONTINUED] tiZANidine (ZANAFLEX) 4 MG capsule Take 4 mg by mouth 2 (two) times daily as needed for muscle spasms.  . [DISCONTINUED] venlafaxine XR (EFFEXOR-XR) 37.5 MG 24 hr capsule Take 1 capsule (37.5 mg total) by mouth daily with breakfast.   No facility-administered encounter medications on file as of 05/17/2019.     Activities of Daily Living In your present state of health, do you have any difficulty performing the following activities: 05/17/2019 10/13/2018  Hearing? N N  Comment declines hearing aids -  Vision? N Y  Difficulty concentrating or making decisions? N N  Walking or climbing stairs? N N  Dressing or bathing? N N  Doing errands, shopping? N N  Preparing Food and eating ? N -  Using the Toilet? N -  In the past six months, have you accidently leaked urine? N -  Do you have problems with loss of bowel control? N -  Managing your Medications? N -  Managing your Finances? N -  Housekeeping or managing your Housekeeping? N -  Some recent data might be hidden    Patient Care Team: Lada,  Satira Anis, MD as PCP - General (Family Medicine) Dorothyann Gibbs, NP as Nurse Practitioner (Gynecologic Oncology) Irine Seal, MD as Consulting Physician (Urology) Boyd Kerbs, MD as Referring Physician (Specialist)    Assessment:   This is a routine wellness examination for Robin Bryan.  Exercise Activities and Dietary recommendations Current Exercise Habits: The patient does not participate in regular exercise at present, Exercise limited by: cardiac condition(s)  Goals    . DIET - INCREASE WATER INTAKE     Recommend to drink at least 6-8 8oz glasses of water per day.    . Increase physical activity     Recommend increasing physical activity to at least 3 days per week       Fall Risk Fall Risk  05/17/2019 10/13/2018 09/20/2018 09/10/2018 07/06/2018  Falls in the past year? 0 0 0 0 0  Number falls in past yr: 0 - - 0 -  Comment - - - - -  Injury with Fall? 0 - - 0 -  Comment - - - - -  Risk Factor Category  - - - - -  Risk for fall due to : - - - - -  Risk for fall due to: Comment - - - - -  Follow up Falls prevention discussed - - - -   FALL RISK PREVENTION PERTAINING TO THE HOME:  Any stairs in or around the home? No  If so, do they handrails? No   Home free of loose throw rugs in walkways, pet beds, electrical cords, etc? Yes  Adequate lighting in your home to reduce risk of falls? Yes   ASSISTIVE DEVICES UTILIZED TO PREVENT FALLS:  Life alert? No Use of a cane, walker or w/c? No  Grab bars in the bathroom? No  Shower chair or bench in shower? Yes  Elevated toilet seat or a handicapped  toilet? No   DME ORDERS:  DME order needed?  No   TIMED UP AND GO:  Was the test performed? No . Telephonic visit.   Education: Fall risk prevention has been discussed.  Intervention(s) required? No   Depression Screen PHQ 2/9 Scores 05/17/2019 10/13/2018 09/20/2018 09/10/2018  PHQ - 2 Score 0 0 0 3  PHQ- 9 Score - 0 0 4     Cognitive Function     6CIT Screen 05/17/2019  05/13/2018  What Year? 0 points 0 points  What month? 0 points 0 points  What time? 0 points 0 points  Count back from 20 0 points 0 points  Months in reverse 0 points 0 points  Repeat phrase 0 points 2 points  Total Score 0 2    Immunization History  Administered Date(s) Administered  . Influenza, High Dose Seasonal PF 05/13/2018  . Influenza,inj,Quad PF,6+ Mos 07/13/2015  . Influenza-Unspecified 06/10/2017  . Pneumococcal Conjugate-13 07/06/2017  . Pneumococcal Polysaccharide-23 08/31/2018  . Tdap 04/03/2017    Qualifies for Shingles Vaccine? Yes . Due for Shingrix. Education has been provided regarding the importance of this vaccine. Pt has been advised to call insurance company to determine out of pocket expense. Advised may also receive vaccine at local pharmacy or Health Dept. Verbalized acceptance and understanding.  Tdap: Up to date  Flu Vaccine: Due for Flu vaccine. Does the patient want to receive this vaccine today?  No . Education has been provided regarding the importance of this vaccine but still declined. Advised may receive this vaccine at local pharmacy or Health Dept. Aware to provide a copy of the vaccination record if obtained from local pharmacy or Health Dept. Verbalized acceptance and understanding.  Pneumococcal Vaccine: Up to date   Screening Tests Health Maintenance  Topic Date Due  . INFLUENZA VACCINE  04/02/2019  . MAMMOGRAM  05/02/2020  . DEXA SCAN  05/02/2021  . COLONOSCOPY  03/18/2022  . TETANUS/TDAP  04/04/2027  . Hepatitis C Screening  Completed  . PNA vac Low Risk Adult  Completed    Cancer Screenings:  Colorectal Screening: Completed 03/18/17. Repeat every 5 years.  Mammogram: Completed 05/03/19. Repeat every year.  Bone Density: Completed 05/03/19. Results reflect NORMAL. Repeat every 2 years.   Lung Cancer Screening: (Low Dose CT Chest recommended if Age 70-80 years, 30 pack-year currently smoking OR have quit w/in 15years.) does not  qualify.   Additional Screening:  Hepatitis C Screening: does qualify; Completed 04/03/17  Vision Screening: Recommended annual ophthalmology exams for early detection of glaucoma and other disorders of the eye. Is the patient up to date with their annual eye exam?  Yes  Who is the provider or what is the name of the office in which the pt attends annual eye exams? Dr. Ellin Mayhew  Dental Screening: Recommended annual dental exams for proper oral hygiene  Community Resource Referral:  CRR required this visit?  No       Plan:   I have personally reviewed and addressed the Medicare Annual Wellness questionnaire and have noted the following in the patient's chart:  A. Medical and social history B. Use of alcohol, tobacco or illicit drugs  C. Current medications and supplements D. Functional ability and status E.  Nutritional status F.  Physical activity G. Advance directives H. List of other physicians I.  Hospitalizations, surgeries, and ER visits in previous 12 months J.  S[place] such as hearing and vision if needed, cognitive and depression L. Referrals and  appointments   In addition, I have reviewed and discussed with patient certain preventive protocols, quality metrics, and best practice recommendations. A written personalized care plan for preventive services as well as general preventive health recommendations were provided to patient.   Signed,  Clemetine Marker, LPN Nurse Health Advisor   Nurse Notes: pt states she would like repeat lung CT/chest xray based on findings from earlier in the year when she had consistent cough/conestion. She also has strong family hx of lung cancer. Pt advised provider would be notified but needs to schedule an appointment to discuss.

## 2019-05-25 DIAGNOSIS — R69 Illness, unspecified: Secondary | ICD-10-CM | POA: Diagnosis not present

## 2019-05-30 ENCOUNTER — Ambulatory Visit: Payer: Medicare HMO | Admitting: Internal Medicine

## 2019-05-30 ENCOUNTER — Encounter: Payer: Self-pay | Admitting: Internal Medicine

## 2019-05-30 VITALS — BP 110/74 | HR 61 | Temp 97.2°F | Ht 68.0 in | Wt 234.0 lb

## 2019-05-30 DIAGNOSIS — K222 Esophageal obstruction: Secondary | ICD-10-CM | POA: Diagnosis not present

## 2019-05-30 DIAGNOSIS — K219 Gastro-esophageal reflux disease without esophagitis: Secondary | ICD-10-CM | POA: Diagnosis not present

## 2019-05-30 DIAGNOSIS — Z86010 Personal history of colonic polyps: Secondary | ICD-10-CM

## 2019-05-30 DIAGNOSIS — K2270 Barrett's esophagus without dysplasia: Secondary | ICD-10-CM | POA: Diagnosis not present

## 2019-05-30 MED ORDER — PANTOPRAZOLE SODIUM 40 MG PO TBEC: DELAYED_RELEASE_TABLET | ORAL | 11 refills | Status: DC

## 2019-05-30 NOTE — Progress Notes (Signed)
HISTORY OF PRESENT ILLNESS:  Robin Bryan is a 68 y.o. female with multiple medical problems as listed below who is followed in this office for chronic GERD complicated by peptic stricture requiring esophageal dilation and Barrett's esophagus.  Also history of adenomatous colon polyps and intermittent fecal incontinence associated with loose stools.  Last seen in this office December 10, 2016.  See that dictation for details.  Patient presents today for follow-up.  She requests a refill of her medication.  Last colonoscopy and upper endoscopy March 18, 2017.  Upper endoscopy revealed esophageal stricture and Barrett's esophagus.  The stricture was dilated with 54 [place] Maloney dilator.  Biopsies confirmed Barrett's but no dysplasia.  Follow-up in 5 years recommended.  Colonoscopy revealed adenomatous colon polyp and benign mucosal biopsies ruling out microscopic colitis.  Follow-up in 5 years recommended.  Patient tells me that as long as she takes her pantoprazole 40 mg twice daily her reflux is under good control.  She does have breakthrough symptoms with lower dosages.  As well, she has had no recurrent dysphasia since her last dilation.  Her issues with alternating bowel habits and incontinence can occur though they have been less problematic as of lately.  No new complaints or other issues.  Review of x-ray file shows abdominal ultrasound September 02, 2018 demonstrating a stable hemangioma of the liver.  Review of outside blood work from August 31, 2018 finds unremarkable comprehensive metabolic panel with normal liver test.  REVIEW OF SYSTEMS:  All non-GI ROS negative unless otherwise stated in the HPI except for sinus trouble, anxiety, back pain, muscle cramps, excessive urination  Past Medical History:  Diagnosis Date  . Allergy   . Anxiety   . Arthritis   . Arthritis of both feet 11/26/2017   xrays March 2019  . Asthma   . Barrett's esophagus   . Bronchitis   . Calcaneal spur of both  feet 11/26/2017   Foot xrays March 2019  . Depression   . Diverticulosis   . Esophageal stricture   . Fatty liver 09/08/2018   [place] Jan 2020  . Fibromyalgia   . GERD (gastroesophageal reflux disease)   . Hx of adenomatous colonic polyps   . Hyperlipidemia   . Hypertension   . Intramural leiomyoma of uterus   . Liver hemangioma 09/08/2018   [place] Jan 2020  . Myocardial infarction ([place])   . Stroke [facility])     Past Surgical History:  Procedure Laterality Date  . ABDOMINAL HYSTERECTOMY    . CATARACT EXTRACTION Bilateral 03/2019  . INCONTINENCE SURGERY    . TUBAL LIGATION    . tumor excision ovaries      Social History Robin Bryan  reports that she quit smoking about 8 years ago. Her smoking use included cigarettes. She has a 4.00 pack-year smoking history. She has never used smokeless tobacco. She reports that she does not drink alcohol or use drugs.  family history includes Atrial fibrillation in her sister; Cancer in her sister; Colon polyps in her mother; Diabetes in her daughter, mother, and sister; Heart disease in her sister; Lung cancer in her father and sister; Ovarian cancer in her sister; Parkinson's disease in her cousin.  Allergies  Allergen Reactions  . Shellfish Allergy Swelling  . Vicodin [Hydrocodone-Acetaminophen] Nausea Only       PHYSICAL EXAMINATION: Vital signs: BP 110/74   Pulse 61   Temp (!) 97.2 F (36.2 C)   Ht 5\' 8"  (1.727 m)   Wt  234 lb (106.1 kg)   BMI 35.58 kg/m   Constitutional: generally well-appearing, no acute distress Psychiatric: alert and oriented x3, cooperative Eyes: extraocular movements intact, anicteric, conjunctiva pink Mouth: oral pharynx moist, no lesions Neck: supple no lymphadenopathy Cardiovascular: heart regular rate and rhythm, no murmur Lungs: clear to auscultation bilaterally Abdomen: soft, obese, nontender, nondistended, no obvious ascites, no peritoneal signs, normal bowel sounds, no organomegaly Rectal:  Omitted Extremities: no clubbing or cyanosis.  Trace lower extremity edema bilaterally Skin: no lesions on visible extremities Neuro: No focal deficits.  Cranial nerves intact  ASSESSMENT:  1.  GERD complicated by nondysplastic Barrett's esophagus and peptic stricture.  She is currently asymptomatic post dilation on PPI 2.  History of adenomatous colon polyps.  Surveillance up-to-date 3.  Obesity 4.  Multiple medical problems 5.  Liver hemangioma  PLAN:  1.  Reflux precautions with attention to weight loss 2.  Continue pantoprazole 40 mg twice daily.  Prescribed.  Multiple refills provided.  Medication effects, side effects, and risks reviewed 3.  Routine office follow-up 2 years.  Contact the office in the interim for any questions or problems such as recurrent dysphasia 4.  Plan routine surveillance colonoscopy and upper endoscopy around July 2023 5.  Ongoing general medical care with Dr. Sanda Bryan

## 2019-05-30 NOTE — Patient Instructions (Signed)
If you are age 66 or older, your body mass index should be between 23-30. Your Body mass index is 35.58 kg/m. If this is out of the aforementioned range listed, please consider follow up with your Primary Care Provider.  If you are age 70 or younger, your body mass index should be between 19-25. Your Body mass index is 35.58 kg/m. If this is out of the aformentioned range listed, please consider follow up with your Primary Care Provider.   Follow up in 2 yrs!  It was a pleasure to see you today!

## 2019-05-31 DIAGNOSIS — Z87891 Personal history of nicotine dependence: Secondary | ICD-10-CM | POA: Diagnosis not present

## 2019-05-31 DIAGNOSIS — I313 Pericardial effusion (noninflammatory): Secondary | ICD-10-CM | POA: Diagnosis not present

## 2019-05-31 DIAGNOSIS — Z23 Encounter for immunization: Secondary | ICD-10-CM | POA: Diagnosis not present

## 2019-05-31 DIAGNOSIS — I10 Essential (primary) hypertension: Secondary | ICD-10-CM | POA: Diagnosis not present

## 2019-05-31 DIAGNOSIS — E7800 Pure hypercholesterolemia, unspecified: Secondary | ICD-10-CM | POA: Diagnosis not present

## 2019-05-31 DIAGNOSIS — E669 Obesity, unspecified: Secondary | ICD-10-CM | POA: Diagnosis not present

## 2019-05-31 DIAGNOSIS — R7303 Prediabetes: Secondary | ICD-10-CM | POA: Diagnosis not present

## 2019-06-10 ENCOUNTER — Ambulatory Visit (INDEPENDENT_AMBULATORY_CARE_PROVIDER_SITE_OTHER): Payer: Medicare HMO | Admitting: Family Medicine

## 2019-06-10 ENCOUNTER — Other Ambulatory Visit: Payer: Self-pay

## 2019-06-10 ENCOUNTER — Encounter: Payer: Self-pay | Admitting: Family Medicine

## 2019-06-10 DIAGNOSIS — Z20822 Contact with and (suspected) exposure to covid-19: Secondary | ICD-10-CM

## 2019-06-10 DIAGNOSIS — J989 Respiratory disorder, unspecified: Secondary | ICD-10-CM

## 2019-06-10 DIAGNOSIS — Z20828 Contact with and (suspected) exposure to other viral communicable diseases: Secondary | ICD-10-CM | POA: Diagnosis not present

## 2019-06-10 MED ORDER — BENZONATATE 100 MG PO CAPS: 100.0000 mg | ORAL_CAPSULE | Freq: Three times a day (TID) | ORAL | 0 refills | Status: DC | PRN

## 2019-06-10 MED ORDER — GUAIFENESIN ER 600 MG PO TB12: 600.0000 mg | ORAL_TABLET | Freq: Two times a day (BID) | ORAL | 0 refills | Status: DC

## 2019-06-10 NOTE — Progress Notes (Signed)
Name: Robin Bryan   MRN: [id number]    DOB: Aug 30, 1951   Date:06/10/2019       Progress Note  Subjective  Chief Complaint  Chief Complaint  Patient presents with  . Cough    congestion for 2 days  . Sore Throat    I connected with  [name] on 06/10/19 at 10:20 AM EDT by telephone and verified that I am speaking with the correct person using two identifiers.   I discussed the limitations, risks, security and privacy concerns of performing an evaluation and management service by telephone and the availability of in person appointments. Staff also discussed with the patient that there may be a patient responsible charge related to this service. Patient Location: Home Provider Location: Office Additional Individuals present: None  HPI  Pt presents with concern for 2 days of very sore throat, cough, chest congestion, body aches with some upper back pain.  No fevers - 99.8F last night.  Denies loss of taste/smell, shortness of breath, chest pain/tightness, NVD or abdominal pain. No known COVID-19 exposure. Trying OTC cough medication without relief; took tylenol which did not help her back pain much. Never smoker, no known lung disease.   Patient Active Problem List   Diagnosis Date Noted  . Liver hemangioma 09/08/2018  . Fatty liver 09/08/2018  . Overactive bladder 03/19/2018  . Obesity (BMI 30.0-34.9) 02/25/2018  . Estrogen deficiency 01/08/2018  . Postmenopausal 01/07/2018  . Back pain 11/26/2017  . Chronic kidney disease, stage III (moderate) 11/26/2017  . Calcaneal spur of both feet 11/26/2017  . Arthritis of both feet 11/26/2017  . Acute left-sided thoracic back pain 10/30/2015  . Temporary cerebral vascular dysfunction 07/16/2015  . Adaptation reaction 07/16/2015  . Foot pain 07/16/2015  . BP (high blood pressure) 07/16/2015  . Acid reflux 07/16/2015  . Abnormal LFTs 07/16/2015  . Pericardial effusion 07/13/2015  . Mass of chest wall, left 06/29/2015   . Lipoma 06/29/2015  . Pure hypercholesterolemia 04/28/2015  . Gastro-esophageal reflux disease without esophagitis 04/28/2015  . Benign essential HTN 04/28/2015  . Hyperlipidemia 03/19/2015  . Elevated hemoglobin A1c 03/19/2015  . Neck muscle spasm 03/19/2015  . Cognitive change 02/07/2015  . Persistent dry cough 02/07/2015  . Major depression in partial remission ([place]) 02/07/2015  . Fibrositis 02/07/2015  . Anxiety disorder 02/07/2015  . History of anaphylaxis 02/07/2015  . Pubic bone pain 10/02/2014  . Mass of pelvis 06/05/2014  . Ovarian cyst, left 05/26/2014  . Abdominal pain, chronic, left lower quadrant 05/26/2014  . CEREBROVASCULAR DISEASE 04/23/2010  . COLONIC POLYPS, ADENOMATOUS 12/09/2006  . ESOPHAGEAL STRICTURE 12/09/2006  . GERD 12/09/2006  . BARRETTS ESOPHAGUS 12/09/2006  . DIVERTICULOSIS, COLON 12/09/2006    Social History   Tobacco Use  . Smoking status: Former Smoker    Packs/day: 0.50    Years: 8.00    Pack years: 4.00    Types: Cigarettes    Quit date: 05/27/2011    Years since quitting: 8.0  . Smokeless tobacco: Never Used  . Tobacco comment: smoking cessation materials not required  Substance Use Topics  . Alcohol use: No     Current Outpatient Medications:  .  clopidogrel (PLAVIX) 75 MG tablet, Take 1 tablet (75 mg total) by mouth daily., Disp: 90 tablet, Rfl: 3 .  diphenhydrAMINE (BENADRYL) 25 mg capsule, Take 1 capsule (25 mg total) by mouth every 8 (eight) hours as needed., Disp: 30 capsule, Rfl: 0 .  EPINEPHrine 0.3 mg/0.3 mL IJ  SOAJ injection, Inject 0.3 mLs (0.3 mg total) into the muscle once., Disp: 1 Device, Rfl: 0 .  escitalopram (LEXAPRO) 10 MG tablet, TAKE 1 TABLET BY MOUTH EVERY DAY, Disp: 90 tablet, Rfl: 0 .  fluticasone (FLONASE) 50 MCG/ACT nasal spray, Place 2 sprays into both nostrils daily., Disp: 16 g, Rfl: 2 .  gabapentin (NEURONTIN) 400 MG capsule, Take 800 mg by mouth 4 (four) times daily. , Disp: , Rfl:  .  MYRBETRIQ 50 MG  TB24 tablet, TK 1 T PO QD, Disp: , Rfl: 3 .  oxyCODONE-acetaminophen (PERCOCET/ROXICET) 5-325 MG tablet, Take 1 tablet by mouth every 6 (six) hours as needed. for pain, Disp: , Rfl:  .  pantoprazole (PROTONIX) 40 MG tablet, Take one tablet by mouth twice a day;  Patient needs to keep upcoming office visit for further refills, Disp: 180 tablet, Rfl: 11 .  primidone (MYSOLINE) 50 MG tablet, Take 1 tablet by mouth daily., Disp: , Rfl: 6 .  propranolol ER (INDERAL LA) 60 MG 24 hr capsule, Take 60 mg by mouth daily. , Disp: , Rfl:  .  rosuvastatin (CRESTOR) 10 MG tablet, TAKE 1 TABLET BY MOUTH EVERYDAY AT BEDTIME, Disp: 90 tablet, Rfl: 0 .  tiZANidine (ZANAFLEX) 4 MG tablet, Take 4-8 mg by mouth at bedtime., Disp: , Rfl:  .  UNABLE TO FIND, APPLY 1 2 GMS TO AFFECTED AREA 3 4 TIMES DAILY AS NEEDED FOR PAIN  THIS IS A COMPOUNDED CREAM 1 PUMP EQUALS 1 GRAM, Disp: , Rfl: 5 .  valsartan (DIOVAN) 80 MG tablet, TAKE 1 TABLET BY MOUTH EVERY DAY, Disp: 90 tablet, Rfl: 1  Allergies  Allergen Reactions  . Shellfish Allergy Swelling  . Vicodin [Hydrocodone-Acetaminophen] Nausea Only    I personally reviewed active problem list, medication list, allergies with the patient/caregiver today.  ROS  Ten systems reviewed and is negative except as mentioned in HPI  Objective  Virtual encounter, vitals not obtained.  There is no height or weight on file to calculate BMI.  Nursing Note and Vital Signs reviewed.  Physical Exam  Constitutional: Patient appears well-developed and well-nourished. No distress.  HENT: Head: Normocephalic and atraumatic.  Neck: Normal range of motion. Pulmonary/Chest: Effort normal. No respiratory distress. Speaking in complete sentences Neurological: Pt is alert and oriented to person, place, and time. Coordination, speech and gait are normal.  Psychiatric: Patient has a normal mood and affect. behavior is normal. Judgment and thought content normal.  No results found for this  or any previous visit (from the past 72 hour(s)).  Assessment & Plan  1. Respiratory illness - benzonatate (TESSALON PERLES) 100 MG capsule; Take 1 capsule (100 mg total) by mouth 3 (three) times daily as needed.  Dispense: 20 capsule; Refill: 0 - guaiFENesin (MUCINEX) 600 MG 12 hr tablet; Take 1 tablet (600 mg total) by mouth 2 (two) times daily.  Dispense: 20 tablet; Refill: 0 - MYCHART COVID-19 HOME MONITORING PROGRAM - MyChart Temperature FLOWSHEET; Future - Novel Coronavirus, NAA ([facility])  -Red flags and when to present for emergency care or RTC including fever >101.32F, chest pain, shortness of breath, new/worsening/un-resolving symptoms, reviewed with patient at time of visit. Follow up and care instructions discussed and provided in AVS. - I discussed the assessment and treatment plan with the patient. The patient was provided an opportunity to ask questions and all were answered. The patient agreed with the plan and demonstrated an understanding of the instructions.  - The patient was advised to call back or  seek an in-person evaluation if the symptoms worsen or if the condition fails to improve as anticipated.  I provided 12 minutes of non-face-to-face time during this encounter.  Hubbard Hartshorn, FNP

## 2019-06-11 LAB — NOVEL CORONAVIRUS, NAA: SARS-CoV-2, NAA: NOT DETECTED

## 2019-06-13 ENCOUNTER — Ambulatory Visit: Payer: Medicare HMO | Admitting: Family Medicine

## 2019-06-15 ENCOUNTER — Telehealth: Payer: Self-pay | Admitting: *Deleted

## 2019-06-15 ENCOUNTER — Ambulatory Visit: Payer: Self-pay | Admitting: *Deleted

## 2019-06-15 DIAGNOSIS — J989 Respiratory disorder, unspecified: Secondary | ICD-10-CM

## 2019-06-15 NOTE — Telephone Encounter (Signed)
Seen by Raelyn Ensign, NP on 06/10/19 with respiratory symptoms. She is reporting she has not improved with the interventions provided.Having cough spells, productive and on guaifenesin 600 mg twice daily. Stated benzonatate not effective for cough.Uses Tussin DM or alka seltzer cold medicine occasionally. Does hear some wheezing stated she uses a ventolin inhaler which clears the wheeze. Denies CP/SOB.   Denies Fever. Stated she was Covid negative recently. No contacts no travels. Reviewed care advice and staying home. Attempted warm transfer but placed on hold. Please have provider see encounter and advise further. Reason for Disposition . SEVERE coughing spells (e.g., whooping sound after coughing, vomiting after coughing)  Answer Assessment - Initial Assessment Questions 1. ONSET: "When did the cough begin?"     6 to 7 days. 2. SEVERITY: "How bad is the cough today?"      coughing spells especially while lying down. 3. RESPIRATORY DISTRESS: "Describe your breathing."      Wheezing 4. FEVER: "Do you have a fever?" If so, ask: "What is your temperature, how was it measured, and when did it start?"    None but last night was 99.1 tympanic 5. SPUTUM: "Describe the color of your sputum" (clear, white, yellow, green)     Thick and white or yellow6. HEMOPTYSIS: "Are you coughing up any blood?" If so ask: "How much?" (flecks, streaks, tablespoons, etc.)    Thick and yelllow to white. 7. CARDIAC HISTORY: "Do you have any history of heart disease?" (e.g., heart attack, congestive heart failure)     none 8. LUNG HISTORY: "Do you have any history of lung disease?"  (e.g., pulmonary embolus, asthma, emphysema)     no 9. PE RISK FACTORS: "Do you have a history of blood clots?" (or: recent major surgery, recent prolonged travel, bedridden)     no 10. OTHER SYMPTOMS: "Do you have any other symptoms?" (e.g., runny nose, wheezing, chest pain)       Wheezing and had stuffy chest, upper area 11. PREGNANCY: "Is  there any chance you are pregnant?" "When was your last menstrual period?"       na12. TRAVEL: "Have you traveled out of the country in the last month?" (e.g., travel history, exposures)       no  Protocols used: [place]

## 2019-06-15 NOTE — Telephone Encounter (Signed)
Spoke to patient she stated she feel better but not good. Dry cough, headache and benzonate not helping. Need something stronger. Feels like going to bronchitis again

## 2019-06-15 NOTE — Telephone Encounter (Signed)
See NT encounter for regarding patient's MyChart message.Marland Kitchen

## 2019-06-16 ENCOUNTER — Telehealth: Payer: Self-pay | Admitting: Family Medicine

## 2019-06-16 MED ORDER — PROMETHAZINE-DM 6.25-15 MG/5ML PO SYRP: 5.0000 mL | ORAL_SOLUTION | Freq: Four times a day (QID) | ORAL | 0 refills | Status: DC | PRN

## 2019-06-16 NOTE — Telephone Encounter (Signed)
Called in to pharmacy and left on provider voicemail

## 2019-06-16 NOTE — Telephone Encounter (Signed)
Patient notified

## 2019-06-16 NOTE — Telephone Encounter (Signed)
Patient states pharmacy denies receiving phone in for promethazine-dextromethorphan (PROMETHAZINE-DM) 6.25-15 MG/5ML syrup , patient requesting medication be sent in, please advise  [id number] [address], [place] [phone] (Phone) [phone] (Fax)

## 2019-06-16 NOTE — Telephone Encounter (Signed)
Sent in stronger cough syrup.  If she feels she needs any additional intervention such as steroid or antibiotic, she'll need an appointment so we can check back in with her symptoms and go over options.

## 2019-06-16 NOTE — Addendum Note (Signed)
Addended by: Hubbard Hartshorn on: 06/16/2019 07:25 AM   Modules accepted: Orders

## 2019-06-30 DIAGNOSIS — R635 Abnormal weight gain: Secondary | ICD-10-CM | POA: Diagnosis not present

## 2019-06-30 DIAGNOSIS — Z87891 Personal history of nicotine dependence: Secondary | ICD-10-CM | POA: Diagnosis not present

## 2019-06-30 DIAGNOSIS — I10 Essential (primary) hypertension: Secondary | ICD-10-CM | POA: Diagnosis not present

## 2019-07-21 DIAGNOSIS — G3184 Mild cognitive impairment, so stated: Secondary | ICD-10-CM | POA: Diagnosis not present

## 2019-07-21 DIAGNOSIS — M542 Cervicalgia: Secondary | ICD-10-CM | POA: Diagnosis not present

## 2019-07-21 DIAGNOSIS — Z79899 Other long term (current) drug therapy: Secondary | ICD-10-CM | POA: Diagnosis not present

## 2019-07-21 DIAGNOSIS — M797 Fibromyalgia: Secondary | ICD-10-CM | POA: Diagnosis not present

## 2019-07-21 DIAGNOSIS — G250 Essential tremor: Secondary | ICD-10-CM | POA: Diagnosis not present

## 2019-07-21 DIAGNOSIS — M545 Low back pain: Secondary | ICD-10-CM | POA: Diagnosis not present

## 2019-07-27 DIAGNOSIS — Z87891 Personal history of nicotine dependence: Secondary | ICD-10-CM | POA: Diagnosis not present

## 2019-07-27 DIAGNOSIS — Z6833 Body mass index (BMI) 33.0-33.9, adult: Secondary | ICD-10-CM | POA: Diagnosis not present

## 2019-07-27 DIAGNOSIS — I10 Essential (primary) hypertension: Secondary | ICD-10-CM | POA: Diagnosis not present

## 2019-07-27 DIAGNOSIS — E669 Obesity, unspecified: Secondary | ICD-10-CM | POA: Diagnosis not present

## 2019-07-27 DIAGNOSIS — R635 Abnormal weight gain: Secondary | ICD-10-CM | POA: Diagnosis not present

## 2019-08-16 ENCOUNTER — Other Ambulatory Visit: Payer: Self-pay

## 2019-08-16 DIAGNOSIS — F3341 Major depressive disorder, recurrent, in partial remission: Secondary | ICD-10-CM

## 2019-08-16 MED ORDER — ESCITALOPRAM OXALATE 10 MG PO TABS: 10.0000 mg | ORAL_TABLET | Freq: Every day | ORAL | 0 refills | Status: DC

## 2019-08-18 ENCOUNTER — Other Ambulatory Visit: Payer: Self-pay

## 2019-08-18 MED ORDER — FLUTICASONE PROPIONATE 50 MCG/ACT NA SUSP: 2.0000 | Freq: Every day | NASAL | 2 refills | Status: DC

## 2019-09-23 ENCOUNTER — Other Ambulatory Visit: Payer: Self-pay | Admitting: Emergency Medicine

## 2019-09-23 DIAGNOSIS — E782 Mixed hyperlipidemia: Secondary | ICD-10-CM

## 2019-09-23 MED ORDER — ROSUVASTATIN CALCIUM 10 MG PO TABS: ORAL_TABLET | ORAL | 0 refills | Status: DC

## 2019-09-23 NOTE — Telephone Encounter (Signed)
Please schedule patient for follow up in the next 30 days.  

## 2019-09-28 ENCOUNTER — Other Ambulatory Visit: Payer: Self-pay | Admitting: Family Medicine

## 2019-09-28 NOTE — Telephone Encounter (Signed)
Requested medication (s) are due for refill today: yes  Requested medication (s) are on the active medication list: yes  Last refill: 06/30/2019  Future visit scheduled: no  Notes to clinic:  Patient overdue for labs and follow up   Requested Prescriptions  Pending Prescriptions Disp Refills   clopidogrel (PLAVIX) 75 MG tablet [Pharmacy Med Name: CLOPIDOGREL 75 MG TABLET] 90 tablet 3    Sig: TAKE 1 TABLET BY [place]      Hematology: Antiplatelets - clopidogrel Failed - 09/28/2019  1:33 AM      Failed - Evaluate AST, ALT within 2 months of therapy initiation.      Failed - ALT in normal range and within 360 days    ALT  Date Value Ref Range Status  08/31/2018 14 6 - 29 U/L Final          Failed - AST in normal range and within 360 days    AST  Date Value Ref Range Status  08/31/2018 10 10 - 35 U/L Final          Failed - HCT in normal range and within 180 days    HCT  Date Value Ref Range Status  11/26/2017 38.9 35.0 - 45.0 % Final   Hematocrit  Date Value Ref Range Status  10/09/2015 41.5 34.0 - 46.6 % Final          Failed - HGB in normal range and within 180 days    Hemoglobin  Date Value Ref Range Status  11/26/2017 13.4 11.7 - 15.5 g/dL Final  10/09/2015 14.3 11.1 - 15.9 g/dL Final          Failed - PLT in normal range and within 180 days    Platelets  Date Value Ref Range Status  11/26/2017 337 140 - 400 Thousand/uL Final  10/09/2015 324 150 - 379 x10E3/uL Final          Passed - Valid encounter within last 6 months    Recent Outpatient Visits           3 months ago Respiratory illness   [place], FNP   11 months ago Cough   [facility] [place], Satira Anis, MD   1 year ago Influenza-like illness   [place], Satira Anis, MD   1 year ago Dizziness   [place], Satira Anis, MD   1 year ago Cough   [place], NP       Future Appointments             In 3 weeks Towanda Malkin, MD [facility], [place]   In 7 months  [facility], [facility]

## 2019-10-03 DIAGNOSIS — Z87442 Personal history of urinary calculi: Secondary | ICD-10-CM | POA: Diagnosis not present

## 2019-10-03 DIAGNOSIS — N3281 Overactive bladder: Secondary | ICD-10-CM | POA: Diagnosis not present

## 2019-10-03 DIAGNOSIS — N3941 Urge incontinence: Secondary | ICD-10-CM | POA: Diagnosis not present

## 2019-10-03 DIAGNOSIS — R3914 Feeling of incomplete bladder emptying: Secondary | ICD-10-CM | POA: Diagnosis not present

## 2019-10-04 DIAGNOSIS — G3184 Mild cognitive impairment, so stated: Secondary | ICD-10-CM | POA: Diagnosis not present

## 2019-10-04 DIAGNOSIS — M542 Cervicalgia: Secondary | ICD-10-CM | POA: Diagnosis not present

## 2019-10-04 DIAGNOSIS — Z79899 Other long term (current) drug therapy: Secondary | ICD-10-CM | POA: Diagnosis not present

## 2019-10-04 DIAGNOSIS — M545 Low back pain: Secondary | ICD-10-CM | POA: Diagnosis not present

## 2019-10-04 DIAGNOSIS — G250 Essential tremor: Secondary | ICD-10-CM | POA: Diagnosis not present

## 2019-10-04 DIAGNOSIS — M797 Fibromyalgia: Secondary | ICD-10-CM | POA: Diagnosis not present

## 2019-10-07 ENCOUNTER — Ambulatory Visit: Payer: Self-pay | Admitting: *Deleted

## 2019-10-07 NOTE — Telephone Encounter (Signed)
Return call to patient, asking what to take for a sore throat and cough. Pt stated she had the sore throat for the last 2 days. She feels it mostly at night nand will wake up her up. She also has a cough, non productive. Stated it is a hacking cough., She has a hx of bronchitis.  She denies fever, She is advised to go get tested for covid, appointment scheduled. Also to drink lots of fluids, gargle with warm salt water, use the sore throat lozenges, drink warm liquids, such as tea with honey and lemon.  Can also take a couple of teaspoon at bedtime to help her throat and cough. Try taking Delsym and mucinex for cough and congestion.  Ask the pharmacist what other cough meds would be recommended for her. Advised her to call back for fever, increase in cough, feeling worst and any other worsening symptoms.  She voiced understanding. Routing to [employer]   for review and any other recommendations.  Reason for Disposition . Health Information question, no triage required and triager able to answer question  Answer Assessment - Initial Assessment Questions 1. REASON FOR CALL or QUESTION: "What is your reason for calling today?" or "How can I best help you?" or "What question do you have that I can help answer?"      Pt wanted to know what she can take over the counter for cough and sore throat.  Protocols used: INFORMATION ONLY CALL-A-AH

## 2019-10-10 ENCOUNTER — Other Ambulatory Visit: Payer: Self-pay | Admitting: Emergency Medicine

## 2019-10-10 ENCOUNTER — Ambulatory Visit: Payer: Medicare HMO

## 2019-10-10 NOTE — Telephone Encounter (Signed)
Please clarify who is managing patient - she went to [facility] a few times since her last visit with [place] and they were monitoring her BP.  If seeing them for primary care, she must obtain refills from them.

## 2019-10-10 NOTE — Telephone Encounter (Signed)
Called patient,left message to see how she is doing or need apppointment

## 2019-10-11 ENCOUNTER — Other Ambulatory Visit: Payer: Self-pay | Admitting: Internal Medicine

## 2019-10-11 MED ORDER — VALSARTAN 80 MG PO TABS: 80.0000 mg | ORAL_TABLET | Freq: Every day | ORAL | 1 refills | Status: DC

## 2019-10-11 NOTE — Telephone Encounter (Signed)
Patient stated she is still seeing doctors here at [facility]. She was seeing them for a diet clinic that she is in. Has appointment with Dr.Hendrickson in a couple of weeks

## 2019-10-11 NOTE — Progress Notes (Signed)
Refilled prescription for Diovan after chart reviewed.

## 2019-10-16 ENCOUNTER — Other Ambulatory Visit: Payer: Self-pay | Admitting: Family Medicine

## 2019-10-16 DIAGNOSIS — E782 Mixed hyperlipidemia: Secondary | ICD-10-CM

## 2019-10-16 NOTE — Telephone Encounter (Signed)
Requested Prescriptions  Pending Prescriptions Disp Refills  . rosuvastatin (CRESTOR) 10 MG tablet [Pharmacy Med Name: ROSUVASTATIN CALCIUM 10 MG TAB] 30 tablet 0    Sig: TAKE 1 TABLET BY MOUTH EVERYDAY AT BEDTIME     Cardiovascular:  Antilipid - Statins Failed - 10/16/2019  1:30 PM      Failed - Total Cholesterol in normal range and within 360 days    Cholesterol, Total  Date Value Ref Range Status  04/28/2016 127 100 - 199 mg/dL Final   Cholesterol  Date Value Ref Range Status  08/31/2018 134 <200 mg/dL Final         Failed - LDL in normal range and within 360 days    LDL Cholesterol (Calc)  Date Value Ref Range Status  08/31/2018 71 mg/dL (calc) Final    Comment:    Reference range: <100 . Desirable range <100 mg/dL for primary prevention;   <70 mg/dL for patients with CHD or diabetic patients  with > or = 2 CHD risk factors. Marland Kitchen LDL-C is now calculated using the Martin-Hopkins  calculation, which is a validated novel method providing  better accuracy than the Friedewald equation in the  estimation of LDL-C.  Cresenciano Genre et al. Annamaria Helling. [id number]): 2061-2068  (http://education.[facility].com/faq/FAQ164)          Failed - HDL in normal range and within 360 days    HDL  Date Value Ref Range Status  08/31/2018 37 (L) >50 mg/dL Final  04/28/2016 37 (L) >39 mg/dL Final         Failed - Triglycerides in normal range and within 360 days    Triglycerides  Date Value Ref Range Status  08/31/2018 181 (H) <150 mg/dL Final         Passed - Patient is not pregnant      Passed - Valid encounter within last 12 months    Recent Outpatient Visits          4 months ago Respiratory illness   [place], Astrid Divine, FNP   1 year ago Cough   [place], Satira Anis, MD   1 year ago Influenza-like illness   [place], Satira Anis, MD   1 year ago Dizziness   [facility],  Satira Anis, MD   1 year ago Cough   [place], NP      Future Appointments            In 1 week Towanda Malkin, MD [facility], [place]   In 7 months  [facility], [facility]

## 2019-10-24 NOTE — Progress Notes (Signed)
Patient ID: Robin Bryan, female    DOB: Oct 03, 1950, 69 y.o.   MRN: [id number]  PCP: Hubbard Hartshorn, FNP  Chief Complaint  Patient presents with  . Medication Refill    Nebulizer  . Hypertension  . Hyperlipidemia  . Gastroesophageal Reflux    Subjective:   Robin Bryan is a 69 y.o. female, presents to clinic with CC of the following:  Chief Complaint  Patient presents with  . Medication Refill    Nebulizer  . Hypertension  . Hyperlipidemia  . Gastroesophageal Reflux    HPI:  Patient is a 69 y.o.female patient of Robin Bryan, who follows up for medication renewal. I refilled Divan for patient on 10/11/2019 after chart reviewed  She has recently been followed in the [facility] outpatient clinic by Dr. Leeanne Rio (last visit end of November 2020) with Hypertension/obesity/Abnormal weight gain and Strict adherence to an intensive low carbohydrate lifestyle program for continued weight loss. Had good adherence noted. BP's were well controlled. Wt Readings from Last 3 Encounters: weight today 215lb 07/27/19 99.1 kg (218 lb 7.6 oz)  06/30/19 (!) 102.1 kg (225 lb 1.4 oz)  05/31/19 (!) 104.7 kg (230 lb 13.2 oz)  Last labs from end of September in the [place] system included a chem profile, CBC, thyroid screen, lipid panel, and A1C and were all ok.  Reviewed those labs with her today.  The LDL was outstanding, less than 70.  She needs a refill of her Crestor product.  Her recent blood pressures have been well controlled, and her blood pressure today was good.  She has continued to lose weight which has been helpful.  She expressed hopes of potentially lessening medications to help control over time.  Her current medications for blood pressure were reviewed.  She denies any recent chest pains, palpitations, shortness of breath with the exception of at times feeling a little short of breath when she is wheezing at night, only intermittently.  Her ankle sometimes get a little more swollen at  the end of the day, although has been well controlled here in the recent past.  She had asthma in her youth, outgrew, and in the past couple months has noted some wheezing at nighttime, not waking her up at night short of breath.  She had really bad allergies this past year, and is taking loratadine presently to help.  She notes she had a real bad bronchitis in her early 2020, with symptoms lasting about 3 months, and often gets that about once a year.  She also noted she has a strong family history of lung cancer.  She had a chest x-ray and February 2020 and was positive for changes of bronchitis and/or asthma without focal airspace pneumonia.  Noted we have been limited with obtaining spirometry due to the Covid.  May be something worth pursuing when available in the near future.  She notes she walks outside a lot with her granddaughter, and at times can get some wheezing and shortness of breath with that as well.  She requested having an albuterol inhaler to use as needed, and notes she has not had one here in the recent past as she ran out a little while back.  On a chronic steroid inhaler in the recent past.  She noted she has fibromyalgia, sees rheumatology, and is trying to decrease her gabapentin presently to 3 times a day from 4 times a day.  Encouraged this, and encouraged the goal of trying to get to  a noontime dose and 1 at bedtime over time if possible.  She also noted she is trying to decrease her dose of pantoprazole, which she takes for reflux, trying to decrease it to once a day from twice a day.  She has had some success doing so here in the recent past.  Strongly agree with trying to decrease medicines over time rather than adding more medicines if possible.  She also noted her EpiPen is 67 years old, and wonders if she can get a refill.  She has an allergy to fire ants, and noted the old EpiPen did not need to be utilized, thankfully.  She also has cardiology, gastroenterology and  rheumatology visits and management in the recent past.   Tob - quit 2012 Also denied alcohol use  Patient Active Problem List   Diagnosis Date Noted  . Liver hemangioma 09/08/2018  . Fatty liver 09/08/2018  . Overactive bladder 03/19/2018  . Obesity (BMI 30.0-34.9) 02/25/2018  . Estrogen deficiency 01/08/2018  . Postmenopausal 01/07/2018  . Back pain 11/26/2017  . Chronic kidney disease, stage III (moderate) 11/26/2017  . Calcaneal spur of both feet 11/26/2017  . Arthritis of both feet 11/26/2017  . Acute left-sided thoracic back pain 10/30/2015  . Temporary cerebral vascular dysfunction 07/16/2015  . Adaptation reaction 07/16/2015  . Foot pain 07/16/2015  . BP (high blood pressure) 07/16/2015  . Acid reflux 07/16/2015  . Abnormal LFTs 07/16/2015  . Pericardial effusion 07/13/2015  . Mass of chest wall, left 06/29/2015  . Lipoma 06/29/2015  . Pure hypercholesterolemia 04/28/2015  . Gastro-esophageal reflux disease without esophagitis 04/28/2015  . Benign essential HTN 04/28/2015  . Hyperlipidemia 03/19/2015  . Elevated hemoglobin A1c 03/19/2015  . Neck muscle spasm 03/19/2015  . Cognitive change 02/07/2015  . Persistent dry cough 02/07/2015  . Major depression in partial remission ([place]) 02/07/2015  . Fibrositis 02/07/2015  . Anxiety disorder 02/07/2015  . History of anaphylaxis 02/07/2015  . Pubic bone pain 10/02/2014  . Mass of pelvis 06/05/2014  . Ovarian cyst, left 05/26/2014  . Abdominal pain, chronic, left lower quadrant 05/26/2014  . CEREBROVASCULAR DISEASE 04/23/2010  . COLONIC POLYPS, ADENOMATOUS 12/09/2006  . ESOPHAGEAL STRICTURE 12/09/2006  . GERD 12/09/2006  . BARRETTS ESOPHAGUS 12/09/2006  . DIVERTICULOSIS, COLON 12/09/2006      Current Outpatient Medications:  .  clopidogrel (PLAVIX) 75 MG tablet, TAKE 1 TABLET BY MOUTH EVERY DAY, Disp: 90 tablet, Rfl: 0 .  diphenhydrAMINE (BENADRYL) 25 mg capsule, Take 1 capsule (25 mg total) by mouth every 8  (eight) hours as needed., Disp: 30 capsule, Rfl: 0 .  escitalopram (LEXAPRO) 10 MG tablet, Take 1 tablet (10 mg total) by mouth daily., Disp: 90 tablet, Rfl: 0 .  fluticasone (FLONASE) 50 MCG/ACT nasal spray, Place 2 sprays into both nostrils daily., Disp: 16 g, Rfl: 2 .  gabapentin (NEURONTIN) 400 MG capsule, Take 800 mg by mouth 4 (four) times daily. , Disp: , Rfl:  .  hydrochlorothiazide (HYDRODIURIL) 12.5 MG tablet, Take 12.5 mg by mouth daily., Disp: , Rfl:  .  MYRBETRIQ 50 MG TB24 tablet, TK 1 T PO QD, Disp: , Rfl: 3 .  oxyCODONE-acetaminophen (PERCOCET/ROXICET) 5-325 MG tablet, Take 1 tablet by mouth every 6 (six) hours as needed. for pain, Disp: , Rfl:  .  pantoprazole (PROTONIX) 40 MG tablet, Take one tablet by mouth twice a day;  Patient needs to keep upcoming office visit for further refills, Disp: 180 tablet, Rfl: 11 .  primidone (MYSOLINE) 50  MG tablet, Take 1 tablet by mouth daily., Disp: , Rfl: 6 .  propranolol ER (INDERAL LA) 60 MG 24 hr capsule, Take 60 mg by mouth daily. , Disp: , Rfl:  .  rosuvastatin (CRESTOR) 10 MG tablet, TAKE 1 TABLET BY MOUTH EVERYDAY AT BEDTIME, Disp: 30 tablet, Rfl: 0 .  tiZANidine (ZANAFLEX) 4 MG tablet, Take 4-8 mg by mouth at bedtime., Disp: , Rfl:  .  UNABLE TO FIND, APPLY 1 2 GMS TO AFFECTED AREA 3 4 TIMES DAILY AS NEEDED FOR PAIN  THIS IS A COMPOUNDED CREAM 1 PUMP EQUALS 1 GRAM, Disp: , Rfl: 5 .  valsartan (DIOVAN) 80 MG tablet, Take 1 tablet (80 mg total) by mouth daily., Disp: 90 tablet, Rfl: 1 .  EPINEPHrine 0.3 mg/0.3 mL IJ SOAJ injection, Inject 0.3 mLs (0.3 mg total) into the muscle once. (Patient not taking: Reported on 10/25/2019), Disp: 1 Device, Rfl: 0   Allergies  Allergen Reactions  . Shellfish Allergy Swelling  . Vicodin [Hydrocodone-Acetaminophen] Nausea Only     Past Surgical History:  Procedure Laterality Date  . ABDOMINAL HYSTERECTOMY    . CATARACT EXTRACTION Bilateral 03/2019  . INCONTINENCE SURGERY    . TUBAL LIGATION      . tumor excision ovaries       Family History  Problem Relation Age of Onset  . Ovarian cancer Sister   . Cancer Sister   . Diabetes Sister   . Heart disease Sister   . Lung cancer Sister   . Atrial fibrillation Sister   . Colon polyps Mother   . Diabetes Mother   . Parkinson's disease Cousin   . Lung cancer Father   . Diabetes Daughter   . Colon cancer Neg Hx   . Esophageal cancer Neg Hx   . Pancreatic cancer Neg Hx   . Stomach cancer Neg Hx      Social History   Socioeconomic History  . Marital status: Married    Spouse name: Orpah Greek  . Number of children: 2  . Years of education: some collee  . Highest education level: 12th grade  Occupational History  . Occupation: Retired  Tobacco Use  . Smoking status: Former Smoker    Packs/day: 0.50    Years: 8.00    Pack years: 4.00    Types: Cigarettes    Quit date: 05/27/2011    Years since quitting: 8.4  . Smokeless tobacco: Never Used  . Tobacco comment: smoking cessation materials not required  Substance and Sexual Activity  . Alcohol use: No  . Drug use: No  . Sexual activity: Yes    Birth control/protection: Post-menopausal  Other Topics Concern  . Not on file  Social History Narrative  . Not on file   Social Determinants of Health   Financial Resource Strain:   . Difficulty of Paying Living Expenses: Not on file  Food Insecurity:   . Worried About Charity fundraiser in the Last Year: Not on file  . Ran Out of Food in the Last Year: Not on file  Transportation Needs:   . Lack of Transportation (Medical): Not on file  . Lack of Transportation (Non-Medical): Not on file  Physical Activity: Inactive  . Days of Exercise per Week: 0 days  . Minutes of Exercise per Session: 0 min  Stress:   . Feeling of Stress : Not on file  Social Connections:   . Frequency of Communication with Friends and Family: Not on file  . Frequency  of Social Gatherings with Friends and Family: Not on file  . Attends Religious  Services: Not on file  . Active Member of Clubs or Organizations: Not on file  . Attends Archivist Meetings: Not on file  . Marital Status: Not on file  Intimate Partner Violence:   . Fear of Current or Ex-Partner: Not on file  . Emotionally Abused: Not on file  . Physically Abused: Not on file  . Sexually Abused: Not on file    With staff assistance, above reviewed with the patient today.  ROS: As per HPI, had implants in both eyes after cataract surgery, not need glasses after and vision good recent past, denied any recent infectious symptoms with no fevers or other concerns for Covid.  She noted she would call again today as she is trying to get the Covid vaccine and that was very much encouraged.  Noted still struggles with some symptoms with the fibromyalgia, and that it especially affected her ability to type in the past, otherwise no specific complaints on a limited and focused system review   No results found for this or any previous visit (from the past 72 hour(s)).   PHQ2/9: Depression screen [facility] 2/9 10/25/2019 05/17/2019 10/13/2018 09/20/2018 09/10/2018  Decreased Interest 0 0 0 0 1  Down, Depressed, Hopeless 0 0 0 0 2  PHQ - 2 Score 0 0 0 0 3  Altered sleeping 0 - 0 0 0  Tired, decreased energy 0 - 0 0 1  Change in appetite 0 - 0 0 0  Feeling bad or failure about yourself  0 - 0 0 0  Trouble concentrating 0 - 0 0 0  Moving slowly or fidgety/restless 0 - 0 0 0  Suicidal thoughts 0 - 0 0 0  PHQ-9 Score 0 - 0 0 4  Difficult doing work/chores Not difficult at all - Not difficult at all Not difficult at all Not difficult at all  Some recent data might be hidden   PHQ-2/9 Result is   Fall Risk: Fall Risk  10/25/2019 05/17/2019 10/13/2018 09/20/2018 09/10/2018  Falls in the past year? 0 0 0 0 0  Number falls in past yr: 0 0 - - 0  Comment - - - - -  Injury with Fall? 0 0 - - 0  Comment - - - - -  Risk Factor Category  - - - - -  Risk for fall due to : - - - - -    Risk for fall due to: Comment - - - - -  Follow up - Falls prevention discussed - - -      Objective:   Vitals:   10/25/19 1051  BP: 122/84  Pulse: 76  Resp: 20  Temp: (!) 97.1 F (36.2 C)  TempSrc: Temporal  SpO2: 97%  Weight: 215 lb 9.6 oz (97.8 kg)  Height: 5\' 8"  (1.727 m)    Body mass index is 32.78 kg/m.  Physical Exam   NAD, masked, pleasant, obese HEENT - [place]/AT, sclera anicteric, PERRL, EOMI, conj - non-inj'ed, TM's and canals clear, pharynx clear Neck - supple, no adenopathy, no TM, carotids 2+ and = Car - RRR without m/g/r Pulm- RR and effort normal at rest, CTA without wheeze or rales Abd - soft, obese , NT,  Back - no CVA tenderness Ext - no LE edema, no active joints Neuro/psychiatric - affect was not flat, appropriate with conversation  Alert and oriented  Grossly non-focal   Speech  is normal   Results for orders placed or performed in visit on 06/10/19  Novel Coronavirus, NAA ([facility])   Specimen: Oropharyngeal(OP) collection in vial transport medium   OROPHARYNGEA  TESTING  Result Value Ref Range   SARS-CoV-2, NAA Not Detected Not Detected   Reviewed labs from [facility] system    Assessment & Plan:   1. Benign essential HTN Cont meds Not need repeat labs again today, as reviewed the ones from late September, but do feel would be good in the next 2 to 3 months to check again noting the blood pressure medicine she is taking. Continue with diet and exercise as she is doing in combination with success with weight loss, as that will be helpful in blood pressure management.  2. Obesity (BMI 30.0-34.9) She noted she has a follow-up appointment again with Dr. Ilda Mori who has been very helpful with monitoring her as she is continuing to proceed with attempted weight loss with success.  3. Mixed hyperlipidemia Last labs reviewed, LDL is very good Do feel continuing the statin is appropriate and renewed. - rosuvastatin (CRESTOR) 10 MG tablet; Take 1 tablet  (10 mg total) by mouth daily.  Dispense: 90 tablet; Refill: 3  4. Gastroesophageal reflux disease without esophagitis Agree with trying to lessen the dose of the pantoprazole to once a day, and eventually hopefully trying to stop completely, and do feel the continued weight loss and diet and activity changes will be helpful  5. History of anaphylaxis Agreed that the EpiPen from 5 years ago was probably expired, and did renew today.  Discussed potential cost issues, and if pharmacy has other options to help lessen that with a different type of EpiPen, contact us to change to help with that. - EPINEPHrine 0.3 mg/0.3 mL IJ SOAJ injection; Inject 0.3 mLs (0.3 mg total) into the muscle once for 1 dose.  Dispense: 0.3 mL; Refill: 0  6. Mild intermittent asthma without complication Did feel renewing an albuterol inhaler was appropriate, to use every 6 hours as needed and monitor use. If using daily and more frequently, she should follow-up, and may need other approaches. Limited without the ability to get spirometry presently due to Covid, although may do in the future.  (Pending her clinical status) May have more of an allergic component, and continue the loratadine also felt reasonable.  Over time, may consider a product such as Singulair  7. Fibromyalgia Continue to follow-up with her rheumatologist, and did agree with trying to lessen the gabapentin if possible as she is doing presently.  8. History of allergy to insects EpiPen refill as above  9. FH: lung cancer Noted the most important issue was having her continue with smoking cessation as planned.  10. Cataract of both eyes, unspecified cataract type  We will tentatively schedule a follow-up in 3 months time, can follow-up sooner as needed.  Will have follow-up labs at that point pending if they are repeated sooner with her follow-up with her provider in the [facility] system.      Towanda Malkin, MD 10/25/19 11:05 AM

## 2019-10-25 ENCOUNTER — Encounter: Payer: Self-pay | Admitting: Internal Medicine

## 2019-10-25 ENCOUNTER — Other Ambulatory Visit: Payer: Self-pay

## 2019-10-25 ENCOUNTER — Ambulatory Visit (INDEPENDENT_AMBULATORY_CARE_PROVIDER_SITE_OTHER): Payer: Medicare HMO | Admitting: Internal Medicine

## 2019-10-25 VITALS — BP 122/84 | HR 76 | Temp 97.1°F | Resp 20 | Ht 68.0 in | Wt 215.6 lb

## 2019-10-25 DIAGNOSIS — I10 Essential (primary) hypertension: Secondary | ICD-10-CM | POA: Diagnosis not present

## 2019-10-25 DIAGNOSIS — E782 Mixed hyperlipidemia: Secondary | ICD-10-CM | POA: Diagnosis not present

## 2019-10-25 DIAGNOSIS — J4520 Mild intermittent asthma, uncomplicated: Secondary | ICD-10-CM

## 2019-10-25 DIAGNOSIS — H269 Unspecified cataract: Secondary | ICD-10-CM | POA: Insufficient documentation

## 2019-10-25 DIAGNOSIS — Z801 Family history of malignant neoplasm of trachea, bronchus and lung: Secondary | ICD-10-CM | POA: Diagnosis not present

## 2019-10-25 DIAGNOSIS — E669 Obesity, unspecified: Secondary | ICD-10-CM

## 2019-10-25 DIAGNOSIS — M797 Fibromyalgia: Secondary | ICD-10-CM

## 2019-10-25 DIAGNOSIS — Z87892 Personal history of anaphylaxis: Secondary | ICD-10-CM

## 2019-10-25 DIAGNOSIS — K219 Gastro-esophageal reflux disease without esophagitis: Secondary | ICD-10-CM | POA: Diagnosis not present

## 2019-10-25 DIAGNOSIS — Z91038 Other insect allergy status: Secondary | ICD-10-CM | POA: Diagnosis not present

## 2019-10-25 MED ORDER — ALBUTEROL SULFATE HFA 108 (90 BASE) MCG/ACT IN AERS: 2.0000 | INHALATION_SPRAY | Freq: Four times a day (QID) | RESPIRATORY_TRACT | 3 refills | Status: DC | PRN

## 2019-10-25 MED ORDER — ROSUVASTATIN CALCIUM 10 MG PO TABS: 10.0000 mg | ORAL_TABLET | Freq: Every day | ORAL | 3 refills | Status: DC

## 2019-10-25 MED ORDER — EPINEPHRINE 0.3 MG/0.3ML IJ SOAJ: 0.3000 mg | Freq: Once | INTRAMUSCULAR | 0 refills | Status: AC

## 2019-10-27 ENCOUNTER — Ambulatory Visit: Payer: Medicare HMO | Attending: Internal Medicine

## 2019-10-27 DIAGNOSIS — Z23 Encounter for immunization: Secondary | ICD-10-CM | POA: Insufficient documentation

## 2019-10-27 NOTE — Progress Notes (Signed)
   Covid-19 Vaccination Clinic  Name:  Robin Bryan    MRN: [id number] DOB: 08-Jun-1951  10/27/2019  [name] was observed post Covid-19 immunization for 15 minutes without incidence. She was provided with Vaccine Information Sheet and instruction to access the V-Safe system.   [name] was instructed to call 911 with any severe reactions post vaccine: Marland Kitchen Difficulty breathing  . Swelling of your face and throat  . A fast heartbeat  . A bad rash all over your body  . Dizziness and weakness    Immunizations Administered    Name Date Dose VIS Date Route   Pfizer COVID-19 Vaccine 10/27/2019 12:24 PM 0.3 mL 08/12/2019 Intramuscular   Manufacturer: [place]   Lot: [id number]   [place]: [id number]

## 2019-11-09 DIAGNOSIS — H26493 Other secondary cataract, bilateral: Secondary | ICD-10-CM | POA: Diagnosis not present

## 2019-11-12 ENCOUNTER — Other Ambulatory Visit: Payer: Self-pay | Admitting: Family Medicine

## 2019-11-12 DIAGNOSIS — F3341 Major depressive disorder, recurrent, in partial remission: Secondary | ICD-10-CM

## 2019-11-13 ENCOUNTER — Other Ambulatory Visit: Payer: Self-pay | Admitting: Family Medicine

## 2019-11-13 NOTE — Telephone Encounter (Signed)
Requested Prescriptions  Pending Prescriptions Disp Refills  . fluticasone (FLONASE) 50 MCG/ACT nasal spray [Pharmacy Med Name: FLUTICASONE PROP 50 MCG SPRAY] 48 mL 1    Sig: SPRAY 2 SPRAYS INTO EACH NOSTRIL EVERY DAY     Ear, Nose, and Throat: Nasal Preparations - Corticosteroids Passed - 11/13/2019  9:51 AM      Passed - Valid encounter within last 12 months    Recent Outpatient Visits          2 weeks ago Benign essential HTN   [place] D, MD   5 months ago Respiratory illness   [place], Astrid Divine, FNP   1 year ago Cough   [place], Satira Anis, MD   1 year ago Influenza-like illness   [place], Satira Anis, MD   1 year ago Dizziness   [facility], Satira Anis, MD      Future Appointments            In 2 months Towanda Malkin, MD [facility], [place]   In 6 months  [facility], [facility]

## 2019-11-18 ENCOUNTER — Other Ambulatory Visit: Payer: Self-pay | Admitting: Family Medicine

## 2019-11-18 DIAGNOSIS — J069 Acute upper respiratory infection, unspecified: Secondary | ICD-10-CM

## 2019-11-23 ENCOUNTER — Ambulatory Visit: Payer: Medicare HMO | Attending: Internal Medicine

## 2019-11-23 DIAGNOSIS — Z23 Encounter for immunization: Secondary | ICD-10-CM

## 2019-11-23 NOTE — Progress Notes (Signed)
   Covid-19 Vaccination Clinic  Name:  Robin Bryan    MRN: [id number] DOB: 04-03-1951  11/23/2019  Robin Bryan was observed post Covid-19 immunization for 15 minutes without incident. She was provided with Vaccine Information Sheet and instruction to access the V-Safe system.   Robin Bryan was instructed to call 911 with any severe reactions post vaccine: Marland Kitchen Difficulty breathing  . Swelling of face and throat  . A fast heartbeat  . A bad rash all over body  . Dizziness and weakness   Immunizations Administered    Name Date Dose VIS Date Route   Pfizer COVID-19 Vaccine 11/23/2019 11:14 AM 0.3 mL 08/12/2019 Intramuscular   Manufacturer: [employer]   Lot: [id number]   [place]: [id number]

## 2019-12-08 DIAGNOSIS — G3184 Mild cognitive impairment, so stated: Secondary | ICD-10-CM | POA: Diagnosis not present

## 2019-12-08 DIAGNOSIS — M797 Fibromyalgia: Secondary | ICD-10-CM | POA: Diagnosis not present

## 2019-12-08 DIAGNOSIS — M542 Cervicalgia: Secondary | ICD-10-CM | POA: Diagnosis not present

## 2019-12-08 DIAGNOSIS — Z79899 Other long term (current) drug therapy: Secondary | ICD-10-CM | POA: Diagnosis not present

## 2019-12-08 DIAGNOSIS — M545 Low back pain: Secondary | ICD-10-CM | POA: Diagnosis not present

## 2019-12-23 IMAGING — CR DG ANKLE COMPLETE 3+V*L*
1 series · 3 of 3 positions shown · non-contrast
Comparison: 06/05/2017

CLINICAL DATA: Ankle pain common no known injury, initial encounter

EXAM:
LEFT ANKLE COMPLETE - 3+ VIEW

[Series 1: dg ankle complete left · 0.14mm/px · 3 of 3 slices shown]
[im 1/3]
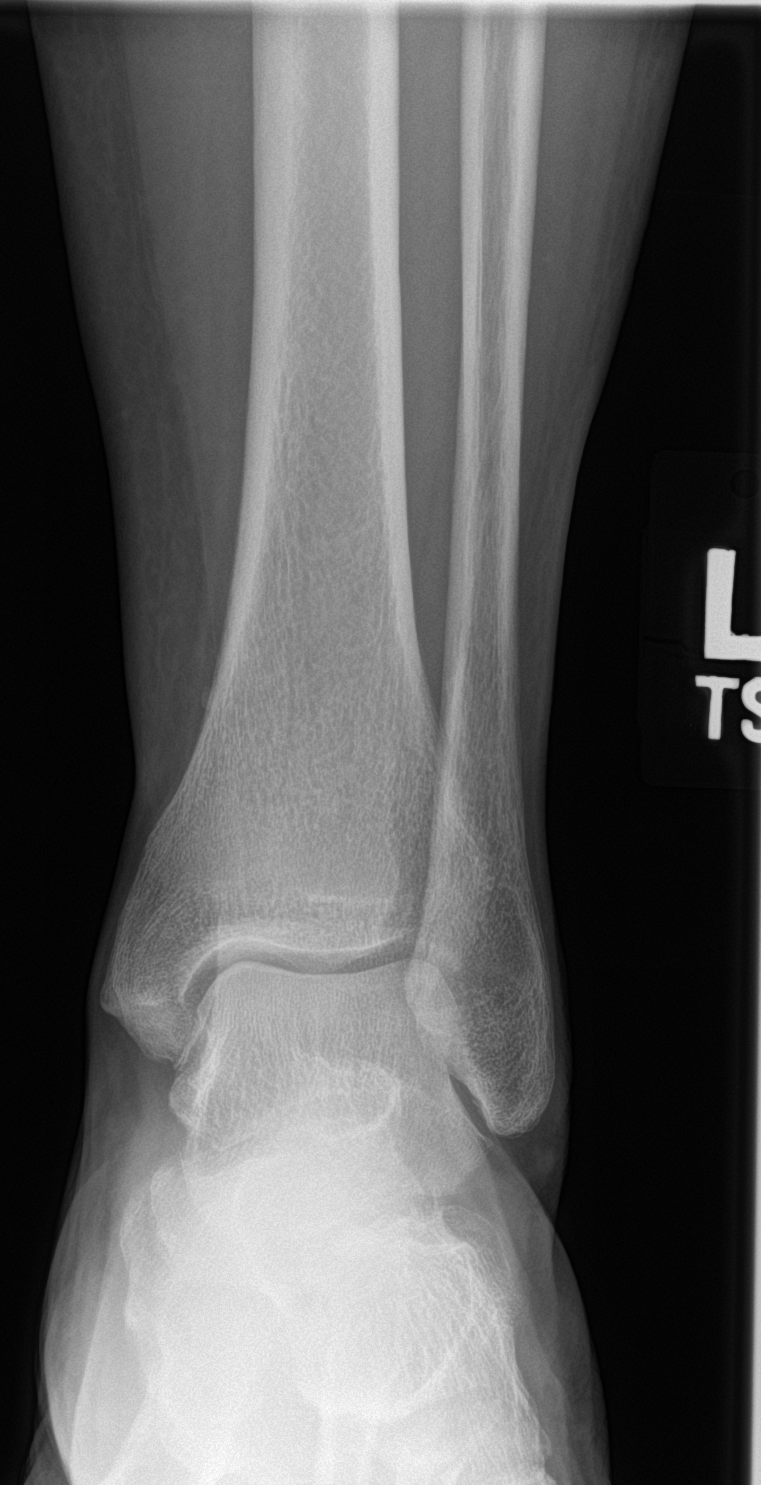
[im 2/3]
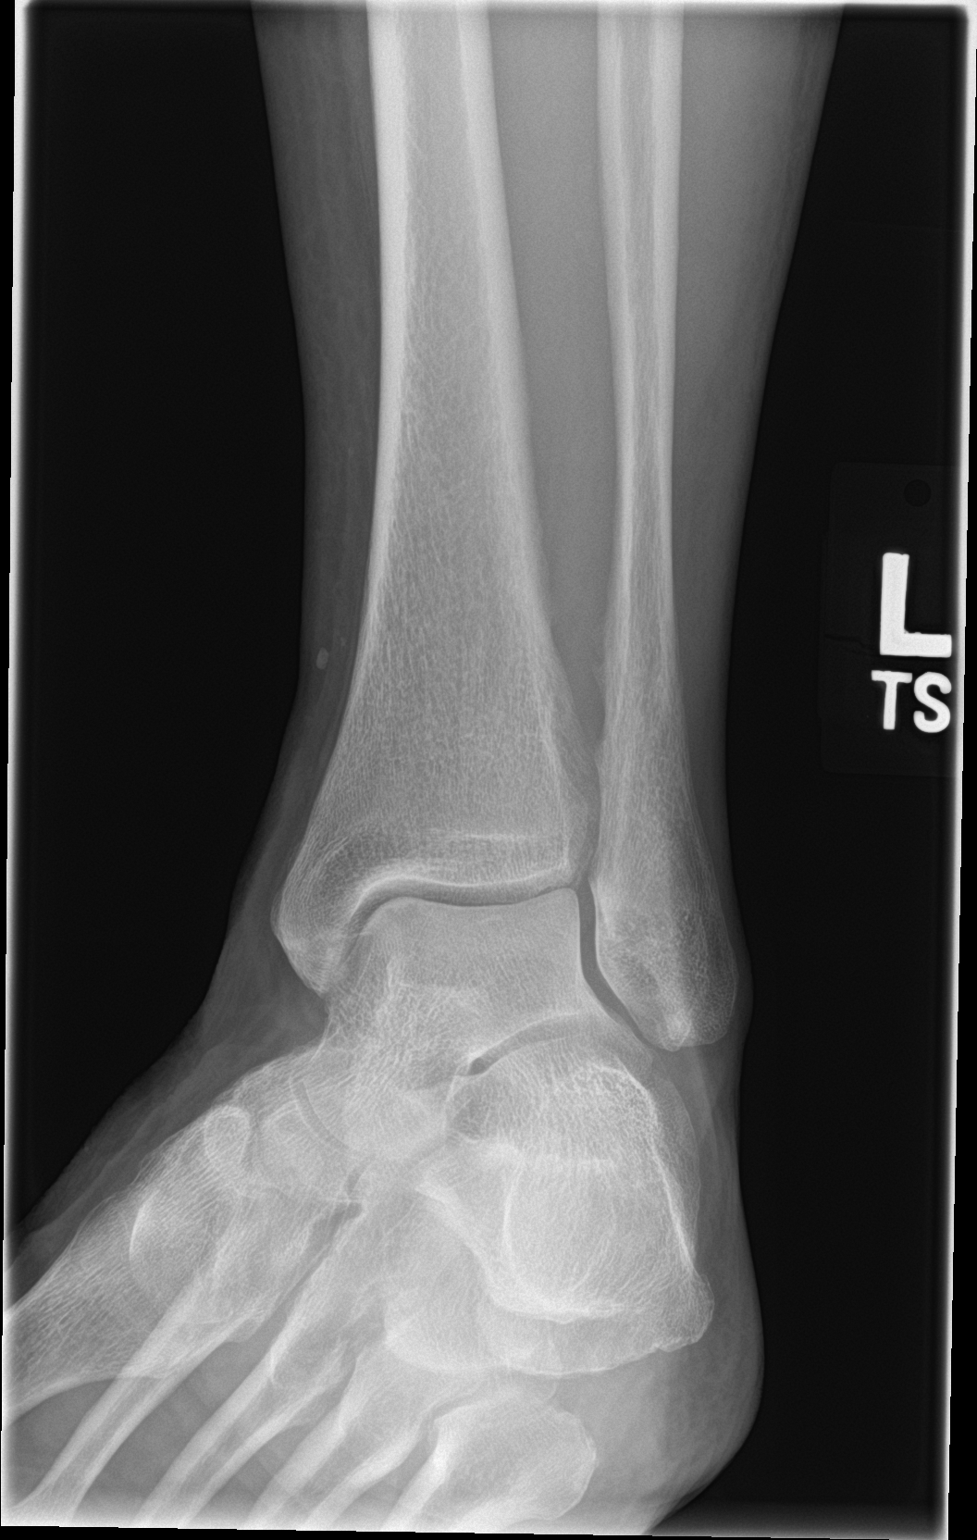
[im 3/3]
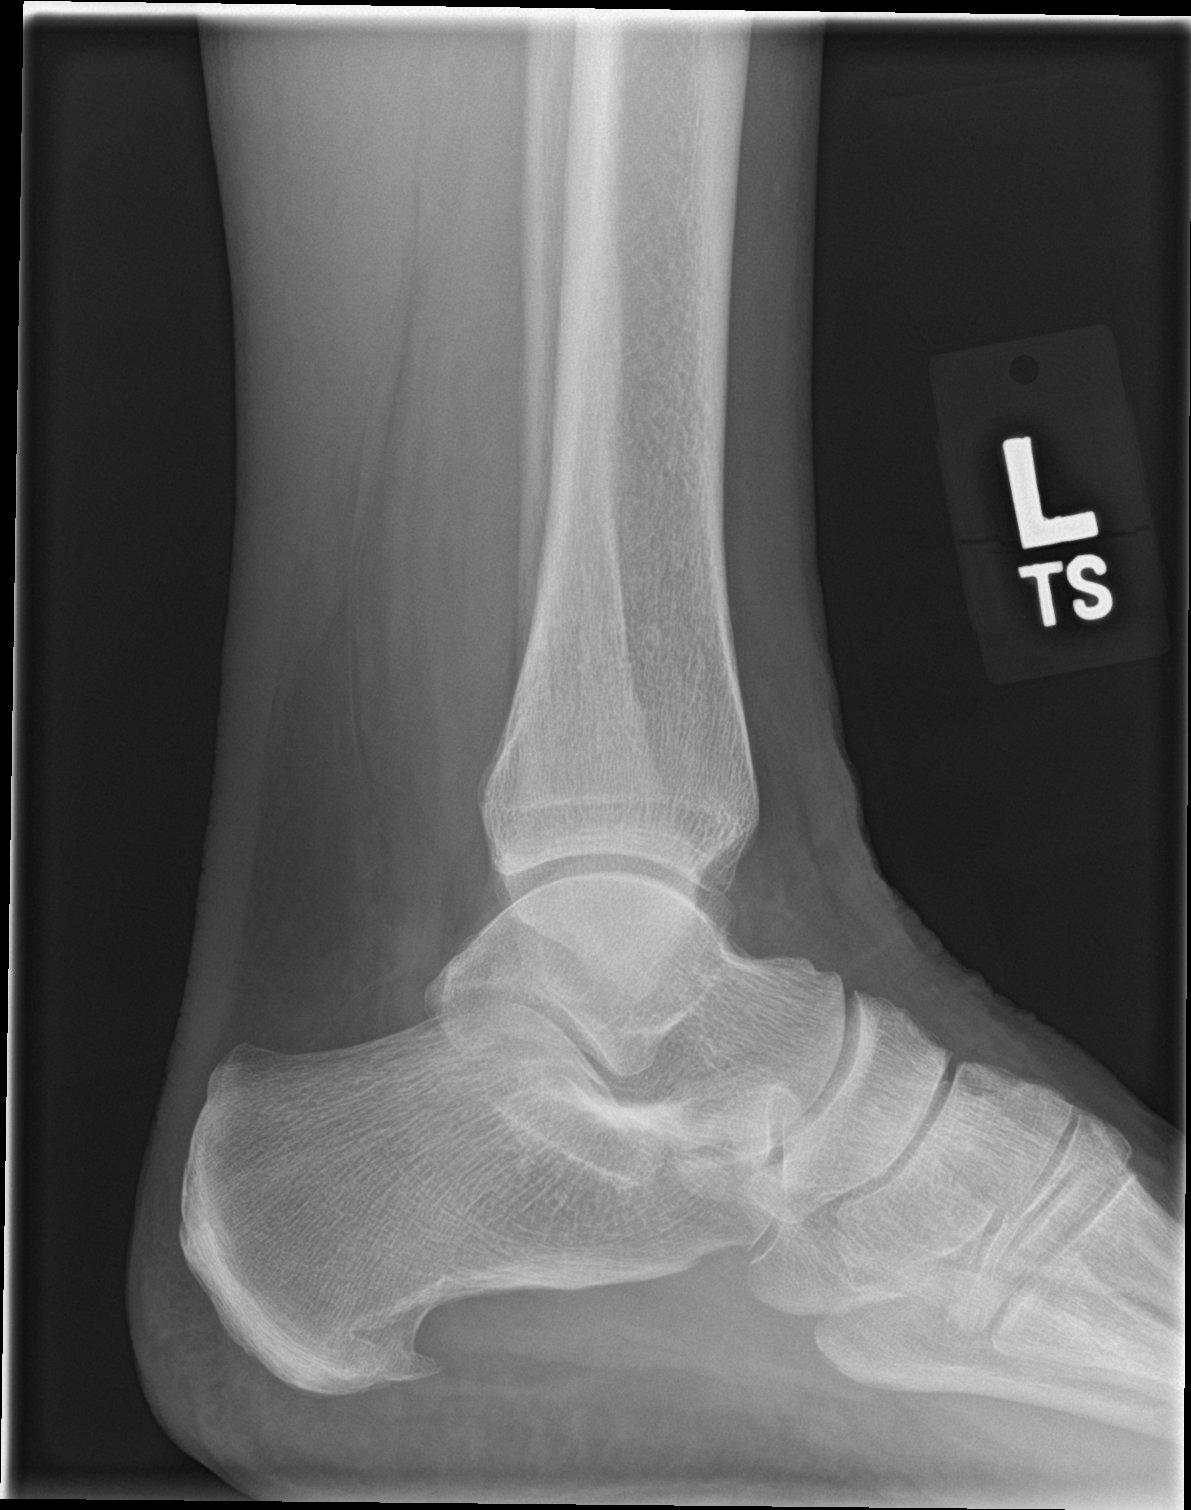

[3 of 3 positions shown; findings below may reference images not displayed]

FINDINGS: No acute fracture or dislocation is noted. Small calcaneal spur is
noted slightly more prominent than that seen on the prior exam. Mild
tarsal degenerative changes are noted. No soft tissue changes are
seen.
IMPRESSION: Mild degenerative change without acute abnormality.

## 2020-01-23 NOTE — Progress Notes (Signed)
Patient ID: Robin Bryan, female    DOB: [birth year], 69 y.o.   MRN: [id number]  PCP: Towanda Malkin, MD  Chief Complaint  Patient presents with  . Follow-up  . Hypertension  . Hyperlipidemia  . Gastroesophageal Reflux  . Fibromyalgia    Subjective:   Robin Bryan is a 69 y.o. female, presents to clinic with CC of the following:  Chief Complaint  Patient presents with  . Follow-up  . Hypertension  . Hyperlipidemia  . Gastroesophageal Reflux  . Fibromyalgia    HPI:  Patient is a 69 year old female patient of Raelyn Ensign I first met patient in February 2021 She presents today for 79-monthfollow-up visit. Her granddaughter was with her today for the visit.  Benign essential HTN Medication regimen-valsartan, hydrochlorothiazide - not been taking regularly, only when ankles swollen, about one day/week, propranolol LA She does not check blood pressures at home. Blood pressures have been well controlled in the recent past. BP Readings from Last 3 Encounters:  01/24/20 130/84  10/25/19 122/84  05/30/19 110/74   Last labs done at the end of September 2020 in the [facility] and reviewed.  Obesity (BMI 30.0-34.9) [facility](last visit end of November 2020) with Hypertension/obesity/Abnormal weight gain and Strict adherence to an intensive low carbohydrate lifestyle program for continued weight loss. Had good adherence noted. Now between which diet wants to be on, not currently on any strict diet now.  And she has gained some weight back recently She notes she walks outside a lot with her granddaughter  Last labs from end of September in the [facility] included a chem profile, CBC, thyroid screen, lipid panel, and A1C and were all ok.  Reviewed those labs.    Wt Readings from Last 3 Encounters:  01/24/20 224 lb 6.4 oz (101.8 kg)  10/25/19 215 lb 9.6 oz (97.8 kg)  05/30/19 234 lb  (106.1 kg)   Prediabetes Lab Results  Component Value Date   HGBA1C 5.7 (H) 08/31/2018   The A1c was noted to be slightly elevated on last check here. No diagnosis of diabetes in her past noted  Mixed hyperlipidemia Last labs reviewed, LDL is very good (from end of Sept in [facility] system) Medication regimen-rosuvastatin (CRESTOR) 10 MG tablet; Take 1 tablet (10 mg total) by mouth daily,  Denies any increased myalgias, no chest pain She does note she gets some intermittent cramping in her lower extremity, in the calfs and feet, and she increased her hydration with more water concern as she thought may be related to dehydration with her increased activities in the warmer weather noted and has been helpful.  Gastroesophageal reflux disease without esophagitis Goal after our last visit was trying to lessen the dose of the pantoprazole to once a day from twice a day.  Now on one daily.Has a h/o Barrett's and follows with GI for maintenance [place]  Feel best to continue PPI once daily presently noting her history.  Mild intermittent asthma without complication She had asthma in her youth, outgrew, and at last visit had noted some wheezing at nighttime, not waking her up at night short of breath.  She had really bad allergies this past year noted. Has an albuterol inhaler to use every 6 hours as needed and she notes she has not needed this in the recent past. Noted previously, may have more of an allergic  component, and she noted she was out of the loratadine and requested a renewal which was done. Also uses flonase and to continue.  Fibromyalgia Continue to follow-up with her neurologist   Was trying to decrease her gabapentin to 3 times a day from 4 times a day, and successful with doing so - now 3X/day. Is helpful.  Cataract of both eyes, unspecified cataract type Did have a follow-up since our last visit with her eye doctor for continued management after cataract  surgery in both eyes.  She notes vision has been stable.  She again noted today her family history of lung cancer, possibly getting chest x-rays to help follow over time.  I noted chest x-rays are not all that helpful for lung cancer screening.  Low-dose CT scans are more helpful, and she does not have a significant pack-year smoking history that would qualify for her to be in that program.  Tob - quit 2012, and continuing with complete tobacco cessation very important. Also denied alcohol use  Patient Active Problem List   Diagnosis Date Noted  . Mild intermittent asthma without complication 94/17/4081  . Cataract of both eyes 10/25/2019  . Liver hemangioma 09/08/2018  . Fatty liver 09/08/2018  . Overactive bladder 03/19/2018  . Obesity (BMI 30.0-34.9) 02/25/2018  . Estrogen deficiency 01/08/2018  . Postmenopausal 01/07/2018  . Back pain 11/26/2017  . Chronic kidney disease, stage III (moderate) 11/26/2017  . Calcaneal spur of both feet 11/26/2017  . Arthritis of both feet 11/26/2017  . Acute left-sided thoracic back pain 10/30/2015  . Temporary cerebral vascular dysfunction 07/16/2015  . Adaptation reaction 07/16/2015  . Foot pain 07/16/2015  . BP (high blood pressure) 07/16/2015  . Acid reflux 07/16/2015  . Abnormal LFTs 07/16/2015  . Pericardial effusion 07/13/2015  . Mass of chest wall, left 06/29/2015  . Lipoma 06/29/2015  . Pure hypercholesterolemia 04/28/2015  . Gastro-esophageal reflux disease without esophagitis 04/28/2015  . Benign essential HTN 04/28/2015  . Hyperlipidemia 03/19/2015  . Elevated hemoglobin A1c 03/19/2015  . Neck muscle spasm 03/19/2015  . Cognitive change 02/07/2015  . Persistent dry cough 02/07/2015  . Major depression in partial remission ([place]) 02/07/2015  . Fibrositis 02/07/2015  . Anxiety disorder 02/07/2015  . History of anaphylaxis 02/07/2015  . Pubic bone pain 10/02/2014  . Mass of pelvis 06/05/2014  . Ovarian cyst, left 05/26/2014  .  Abdominal pain, chronic, left lower quadrant 05/26/2014  . CEREBROVASCULAR DISEASE 04/23/2010  . COLONIC POLYPS, ADENOMATOUS 12/09/2006  . ESOPHAGEAL STRICTURE 12/09/2006  . GERD 12/09/2006  . BARRETTS ESOPHAGUS 12/09/2006  . DIVERTICULOSIS, COLON 12/09/2006      Current Outpatient Medications:  .  albuterol (VENTOLIN HFA) 108 (90 Base) MCG/ACT inhaler, Inhale 2 puffs into the lungs every 6 (six) hours as needed for wheezing or shortness of breath., Disp: 6.7 g, Rfl: 3 .  clopidogrel (PLAVIX) 75 MG tablet, TAKE 1 TABLET BY MOUTH EVERY DAY, Disp: 90 tablet, Rfl: 0 .  diphenhydrAMINE (BENADRYL) 25 mg capsule, Take 1 capsule (25 mg total) by mouth every 8 (eight) hours as needed., Disp: 30 capsule, Rfl: 0 .  escitalopram (LEXAPRO) 10 MG tablet, TAKE 1 TABLET BY MOUTH EVERY DAY, Disp: 90 tablet, Rfl: 0 .  fluticasone (FLONASE) 50 MCG/ACT nasal spray, SPRAY 2 SPRAYS INTO EACH NOSTRIL EVERY DAY, Disp: 48 mL, Rfl: 1 .  gabapentin (NEURONTIN) 400 MG capsule, Take 800 mg by mouth 4 (four) times daily. , Disp: , Rfl:  .  hydrochlorothiazide (HYDRODIURIL) 12.5 MG tablet,  Take 12.5 mg by mouth daily., Disp: , Rfl:  .  MYRBETRIQ 50 MG TB24 tablet, TK 1 T PO QD, Disp: , Rfl: 3 .  oxyCODONE-acetaminophen (PERCOCET/ROXICET) 5-325 MG tablet, Take 1 tablet by mouth every 6 (six) hours as needed. for pain, Disp: , Rfl:  .  pantoprazole (PROTONIX) 40 MG tablet, Take one tablet by mouth twice a day;  Patient needs to keep upcoming office visit for further refills, Disp: 180 tablet, Rfl: 11 .  primidone (MYSOLINE) 50 MG tablet, Take 1 tablet by mouth daily., Disp: , Rfl: 6 .  propranolol ER (INDERAL LA) 60 MG 24 hr capsule, Take 60 mg by mouth daily. , Disp: , Rfl:  .  rosuvastatin (CRESTOR) 10 MG tablet, Take 1 tablet (10 mg total) by mouth daily., Disp: 90 tablet, Rfl: 3 .  tiZANidine (ZANAFLEX) 4 MG tablet, Take 4-8 mg by mouth at bedtime., Disp: , Rfl:  .  valsartan (DIOVAN) 80 MG tablet, Take 1 tablet (80  mg total) by mouth daily., Disp: 90 tablet, Rfl: 1 .  EPINEPHrine 0.3 mg/0.3 mL IJ SOAJ injection, Inject 0.3 mLs (0.3 mg total) into the muscle once for 1 dose., Disp: 0.3 mL, Rfl: 0   Allergies  Allergen Reactions  . Shellfish Allergy Swelling  . Vicodin [Hydrocodone-Acetaminophen] Nausea Only     Past Surgical History:  Procedure Laterality Date  . ABDOMINAL HYSTERECTOMY    . CATARACT EXTRACTION Bilateral 03/2019  . INCONTINENCE SURGERY    . TUBAL LIGATION    . tumor excision ovaries       Family History  Problem Relation Age of Onset  . Ovarian cancer Sister   . Cancer Sister   . Diabetes Sister   . Heart disease Sister   . Lung cancer Sister   . Atrial fibrillation Sister   . Colon polyps Mother   . Diabetes Mother   . Parkinson's disease Cousin   . Lung cancer Father   . Diabetes Daughter   . Colon cancer Neg Hx   . Esophageal cancer Neg Hx   . Pancreatic cancer Neg Hx   . Stomach cancer Neg Hx      Social History   Tobacco Use  . Smoking status: Former Smoker    Packs/day: 0.50    Years: 8.00    Pack years: 4.00    Types: Cigarettes    Quit date: 05/27/2011    Years since quitting: 8.6  . Smokeless tobacco: Never Used  . Tobacco comment: smoking cessation materials not required  Substance Use Topics  . Alcohol use: No    With staff assistance, above reviewed with the patient today.  ROS: As per HPI, otherwise no specific complaints on a limited and focused system review   No results found for this or any previous visit (from the past 72 hour(s)).   PHQ2/9: Depression screen [facility] 2/9 01/24/2020 10/25/2019 05/17/2019 10/13/2018 09/20/2018  Decreased Interest 0 0 0 0 0  Down, Depressed, Hopeless 0 0 0 0 0  PHQ - 2 Score 0 0 0 0 0  Altered sleeping 0 0 - 0 0  Tired, decreased energy 0 0 - 0 0  Change in appetite 0 0 - 0 0  Feeling bad or failure about yourself  0 0 - 0 0  Trouble concentrating 0 0 - 0 0  Moving slowly or fidgety/restless 0 0 - 0 0   Suicidal thoughts 0 0 - 0 0  PHQ-9 Score 0 0 - 0 0  Difficult doing work/chores Not difficult at all Not difficult at all - Not difficult at all Not difficult at all  Some recent data might be hidden   PHQ-2/9 Result is neg  Fall Risk: Fall Risk  01/24/2020 10/25/2019 05/17/2019 10/13/2018 09/20/2018  Falls in the past year? 0 0 0 0 0  Number falls in past yr: 0 0 0 - -  Comment - - - - -  Injury with Fall? 0 0 0 - -  Comment - - - - -  Risk Factor Category  - - - - -  Risk for fall due to : - - - - -  Risk for fall due to: Comment - - - - -  Follow up - - Falls prevention discussed - -      Objective:   Vitals:   01/24/20 1044  BP: 130/84  Pulse: 77  Resp: 16  Temp: 97.6 F (36.4 C)  TempSrc: Temporal  SpO2: 96%  Weight: 224 lb 6.4 oz (101.8 kg)  Height: '5\' 8"'$  (1.727 m)    Body mass index is 34.12 kg/m.  Physical Exam   NAD, masked, very pleasant HEENT - [place]/AT, sclera anicteric, PERRL, EOMI, conj - non-inj'ed, pharynx clear Neck - supple, no adenopathy,  carotids 2+ and = without bruits bilat Car - RRR without m/g/r Pulm- RR and effort normal at rest, CTA without wheeze or rales Abd - soft, obese, NT, ND, no masses Back - no CVA tenderness Ext - no LE edema, no active joints Neuro/psychiatric - affect was not flat, appropriate with conversation  Alert   Grossly non-focal - good strength on testing extremities, sensation intact to LT in distal extremities  Speech normal   Results for orders placed or performed in visit on 06/10/19  Novel Coronavirus, NAA ([facility])   Specimen: Oropharyngeal(OP) collection in vial transport medium   OROPHARYNGEA  TESTING  Result Value Ref Range   SARS-CoV-2, NAA Not Detected Not Detected       Assessment & Plan:   1. Seasonal allergic rhinitis due to pollen Continue the Flonase product Refill the loratadine for her to take once daily in combination as she thinks that is needed presently. - loratadine (CLARITIN) 10 MG  tablet; Take 1 tablet (10 mg total) by mouth daily.  Dispense: 90 tablet; Refill: 3  2. Prediabetes Noted higher A1c on last check, and will repeat with her current labs - COMPLETE METABOLIC PANEL WITH GFR; Future - TSH; Future - Hemoglobin A1c; Future  3. Benign essential HTN Blood pressures have remained well controlled Continue her current medication regimen. She really wanted to recheck labs again with her last in September 2020, and would like to get fasting.  She will return fasting in the next 1 to 2-weeks to have the labs drawn - COMPLETE METABOLIC PANEL WITH GFR; Future  4. Gastroesophageal reflux disease without esophagitis Has reduced her PPI to once daily and has done well doing so with no symptoms of concern.  We will continue once daily with her history of Barrett's noted. She continues to follow with GI, and emphasized the importance of that with her Barrett's history and some maintenance scoping procedures that are needed to help ensure no progression.  She notes the GI office calls her when she is due for another follow-up and does not have one scheduled presently. - COMPLETE METABOLIC PANEL WITH GFR; Future - CBC with Differential/Platelet; Future  5. Barrett's esophagus without dysplasia As above  6. Fibromyalgia Continue the  gabapentin which she has reduced to 3 times a day with success. She states she follows up with her neurologist for this and will continue.  7. Mixed hyperlipidemia We will recheck labs, and do feel getting a fasting specimen is important and she will return to have this done. Continue the statin product presently. - COMPLETE METABOLIC PANEL WITH GFR; Future - Lipid panel; Future - TSH; Future  8. Obesity (BMI 30.0-34.9) Has again gained some weight, and she is trying to continue to watch her diet although admits has not been as aggressive in the recent past. She does not think she will return to see the [place] clinician that she had been  seeing Emphasized the importance of weight management, especially with respect to the conditions above, and trying to lose some weight again encouraged.  Doing so with dietary modifications and staying active as she is doing walking now with the warmer weather is important to help. - Lipid panel; Future - TSH; Future - Hemoglobin A1c; Future  9. Cataract of both eyes, unspecified cataract type Continue to follow with her eye doctor presently  10. Mild intermittent asthma without complication She notes she has not needed her albuterol inhaler in the recent past, and continue the management of her allergic rhinitis which is likely helpful in controlling any allergic asthma symptoms. Still has the albuterol inhaler to use as needed.  We will schedule follow-up in approximately 6 months time, sooner as needed. Await lab results presently, and she will return fasting to have those completed.     Towanda Malkin, MD 01/24/20 10:55 AM

## 2020-01-24 ENCOUNTER — Ambulatory Visit (INDEPENDENT_AMBULATORY_CARE_PROVIDER_SITE_OTHER): Payer: Medicare HMO | Admitting: Internal Medicine

## 2020-01-24 ENCOUNTER — Encounter: Payer: Self-pay | Admitting: Internal Medicine

## 2020-01-24 ENCOUNTER — Other Ambulatory Visit: Payer: Self-pay

## 2020-01-24 VITALS — BP 130/84 | HR 77 | Temp 97.6°F | Resp 16 | Ht 68.0 in | Wt 224.4 lb

## 2020-01-24 DIAGNOSIS — E66811 Obesity, class 1: Secondary | ICD-10-CM

## 2020-01-24 DIAGNOSIS — K219 Gastro-esophageal reflux disease without esophagitis: Secondary | ICD-10-CM | POA: Diagnosis not present

## 2020-01-24 DIAGNOSIS — H269 Unspecified cataract: Secondary | ICD-10-CM | POA: Diagnosis not present

## 2020-01-24 DIAGNOSIS — M797 Fibromyalgia: Secondary | ICD-10-CM | POA: Diagnosis not present

## 2020-01-24 DIAGNOSIS — J4520 Mild intermittent asthma, uncomplicated: Secondary | ICD-10-CM | POA: Diagnosis not present

## 2020-01-24 DIAGNOSIS — I10 Essential (primary) hypertension: Secondary | ICD-10-CM | POA: Diagnosis not present

## 2020-01-24 DIAGNOSIS — E782 Mixed hyperlipidemia: Secondary | ICD-10-CM | POA: Diagnosis not present

## 2020-01-24 DIAGNOSIS — J301 Allergic rhinitis due to pollen: Secondary | ICD-10-CM | POA: Diagnosis not present

## 2020-01-24 DIAGNOSIS — R7303 Prediabetes: Secondary | ICD-10-CM | POA: Diagnosis not present

## 2020-01-24 DIAGNOSIS — K2270 Barrett's esophagus without dysplasia: Secondary | ICD-10-CM

## 2020-01-24 DIAGNOSIS — E669 Obesity, unspecified: Secondary | ICD-10-CM | POA: Diagnosis not present

## 2020-01-24 MED ORDER — LORATADINE 10 MG PO TABS: 10.0000 mg | ORAL_TABLET | Freq: Every day | ORAL | 3 refills | Status: DC

## 2020-01-31 ENCOUNTER — Other Ambulatory Visit: Payer: Self-pay | Admitting: Internal Medicine

## 2020-01-31 DIAGNOSIS — E669 Obesity, unspecified: Secondary | ICD-10-CM | POA: Diagnosis not present

## 2020-01-31 DIAGNOSIS — E782 Mixed hyperlipidemia: Secondary | ICD-10-CM | POA: Diagnosis not present

## 2020-01-31 DIAGNOSIS — I10 Essential (primary) hypertension: Secondary | ICD-10-CM | POA: Diagnosis not present

## 2020-01-31 DIAGNOSIS — R7303 Prediabetes: Secondary | ICD-10-CM | POA: Diagnosis not present

## 2020-01-31 DIAGNOSIS — K219 Gastro-esophageal reflux disease without esophagitis: Secondary | ICD-10-CM | POA: Diagnosis not present

## 2020-02-01 LAB — CBC WITH DIFFERENTIAL/PLATELET
Absolute Monocytes: 410 cells/uL (ref 200–950)
Basophils Absolute: 69 cells/uL (ref 0–200)
Basophils Relative: 1.1 %
Eosinophils Absolute: 233 cells/uL (ref 15–500)
Eosinophils Relative: 3.7 %
HCT: 40.4 % (ref 35.0–45.0)
Hemoglobin: 13.8 g/dL (ref 11.7–15.5)
Lymphs Abs: 1966 cells/uL (ref 850–3900)
MCH: 31.7 pg (ref 27.0–33.0)
MCHC: 34.2 g/dL (ref 32.0–36.0)
MCV: 92.9 fL (ref 80.0–100.0)
MPV: 8.7 fL (ref 7.5–12.5)
Monocytes Relative: 6.5 %
Neutro Abs: 3623 cells/uL (ref 1500–7800)
Neutrophils Relative %: 57.5 %
Platelets: 233 10*3/uL (ref 140–400)
RBC: 4.35 10*6/uL (ref 3.80–5.10)
RDW: 13.1 % (ref 11.0–15.0)
Total Lymphocyte: 31.2 %
WBC: 6.3 10*3/uL (ref 3.8–10.8)

## 2020-02-01 LAB — COMPLETE METABOLIC PANEL WITH GFR
AG Ratio: 2 (calc) (ref 1.0–2.5)
ALT: 17 U/L (ref 6–29)
AST: 15 U/L (ref 10–35)
Albumin: 4.2 g/dL (ref 3.6–5.1)
Alkaline phosphatase (APISO): 57 U/L (ref 37–153)
BUN: 14 mg/dL (ref 7–25)
CO2: 31 mmol/L (ref 20–32)
Calcium: 9.4 mg/dL (ref 8.6–10.4)
Chloride: 107 mmol/L (ref 98–110)
Creat: 0.7 mg/dL (ref 0.50–0.99)
GFR, Est African American: 104 mL/min/{1.73_m2} (ref >=60)
GFR, Est Non African American: 90 mL/min/{1.73_m2} (ref >=60)
Globulin: 2.1 g/dL (calc) (ref 1.9–3.7)
Glucose, Bld: 113 mg/dL — ABNORMAL HIGH (ref 65–99)
Potassium: 4.9 mmol/L (ref 3.5–5.3)
Sodium: 144 mmol/L (ref 135–146)
Total Bilirubin: 0.5 mg/dL (ref 0.2–1.2)
Total Protein: 6.3 g/dL (ref 6.1–8.1)

## 2020-02-01 LAB — LIPID PANEL
Cholesterol: 109 mg/dL (ref <200)
HDL: 37 mg/dL — ABNORMAL LOW (ref >=50)
LDL Cholesterol (Calc): 53 mg/dL (calc)
Non-HDL Cholesterol (Calc): 72 mg/dL (calc) (ref <130)
Total CHOL/HDL Ratio: 2.9 (calc) (ref <5.0)
Triglycerides: 103 mg/dL (ref <150)

## 2020-02-01 LAB — HEMOGLOBIN A1C
Hgb A1c MFr Bld: 5.3 % of total Hgb (ref <5.7)
Mean Plasma Glucose: 105 (calc)
eAG (mmol/L): 5.8 (calc)

## 2020-02-01 LAB — TSH: TSH: 1.48 mIU/L (ref 0.40–4.50)

## 2020-02-02 ENCOUNTER — Telehealth: Payer: Self-pay

## 2020-02-02 NOTE — Telephone Encounter (Signed)
Spoke with patient and she was given lab results.   Copied from [place] [id number]. Topic: General - Other >> Feb 01, 2020  4:00 PM Celene Kras wrote: Reason for CRM: Pt called stating that she received a call from the office. Pt believes it was for her lab results. Pt is requesting a call back. Please advise .

## 2020-02-21 ENCOUNTER — Telehealth: Payer: Self-pay | Admitting: Internal Medicine

## 2020-02-21 NOTE — Chronic Care Management (AMB) (Signed)
  Chronic Care Management   Outreach Note  02/21/2020 Name: Robin Bryan MRN: [id number] DOB: 13-Sep-1950  Robin Bryan is a 69 y.o. year old female who is a primary care patient of Towanda Malkin, MD. I reached out to Trina Ao by phone today in response to a referral sent by Ms. Dalbert Mayotte Herne's health plan.     An unsuccessful telephone outreach was attempted today. The patient was referred to the case management team for assistance with care management and care coordination.   Follow Up Plan: A HIPPA compliant phone message was left for the patient providing contact information and requesting a return call.  The care management team will reach out to the patient again over the next 7 days.  If patient returns call to provider office, please advise to call [facility] [place]  at [place], [place], [place], [place] [address] Direct Dial: [phone] [email or web address]: [email or web address]

## 2020-03-06 NOTE — Chronic Care Management (AMB) (Signed)
  Chronic Care Management   Outreach Note  03/06/2020 Name: Robin Bryan MRN: [id number] DOB: 10/11/1950  Robin Bryan is a 69 y.o. year old female who is a primary care patient of Towanda Malkin, MD. I reached out to Trina Ao by phone today in response to a referral sent by Ms. Dalbert Mayotte [employer]ers's health plan.     A second unsuccessful telephone outreach was attempted today. The patient was referred to the case management team for assistance with care management and care coordination.   Follow Up Plan: A HIPPA compliant phone message was left for the patient providing contact information and requesting a return call.  The care management team will reach out to the patient again over the next 7 days.  If patient returns call to provider office, please advise to call [facility]  at [place], [place], [place], [place] [address] Direct Dial: [phone] [email or web address]: [email or web address]

## 2020-03-12 NOTE — Chronic Care Management (AMB) (Signed)
  Chronic Care Management   Note  03/12/2020 Name: Robin Bryan MRN: [id number] DOB: 05/16/1951  Robin Bryan is a 69 y.o. year old female who is a primary care patient of Towanda Malkin, MD. I reached out to Robin Bryan by phone today in response to a referral sent by Robin Bryan health plan.     Robin Bryan was given information about Chronic Care Management services today including:  1. CCM service includes personalized support from designated clinical staff supervised by her physician, including individualized plan of care and coordination with other care providers 2. 24/7 contact phone numbers for assistance for urgent and routine care needs. 3. Service will only be billed when office clinical staff spend 20 minutes or more in a month to coordinate care. 4. Only one practitioner may furnish and bill the service in a calendar month. 5. The patient may stop CCM services at any time (effective at the end of the month) by phone call to the office staff. 6. The patient will be responsible for cost sharing (co-pay) of up to 20% of the service fee (after annual deductible is met).  Patient did not agree to enrollment in care management services and does not wish to consider at this time.  Follow up plan: The patient has been provided with contact information for the care management team and has been advised to call with any health related questions or concerns.   Noreene Larsson, [place], [place], [place] [address] Direct Dial: [phone] [email or web address] Website: [email or web address]

## 2020-04-06 ENCOUNTER — Other Ambulatory Visit: Payer: Self-pay

## 2020-04-06 ENCOUNTER — Other Ambulatory Visit: Payer: Self-pay | Admitting: Internal Medicine

## 2020-04-06 DIAGNOSIS — F3341 Major depressive disorder, recurrent, in partial remission: Secondary | ICD-10-CM

## 2020-04-06 MED ORDER — ESCITALOPRAM OXALATE 10 MG PO TABS: 10.0000 mg | ORAL_TABLET | Freq: Every day | ORAL | 3 refills | Status: DC

## 2020-04-06 MED ORDER — CLOPIDOGREL BISULFATE 75 MG PO TABS: 75.0000 mg | ORAL_TABLET | Freq: Every day | ORAL | 1 refills | Status: DC

## 2020-04-06 NOTE — Progress Notes (Signed)
Patient requested a refill of her Lexapro and was done.

## 2020-04-06 NOTE — Telephone Encounter (Signed)
Could you please call the patient and ask her why she is taking the Plavix and who prescribed this for her initially. It is not clear to me reviewing her records. Thanks

## 2020-04-06 NOTE — Telephone Encounter (Signed)
I spoke with the patient she states Dr. Erling Cruz with [facility] initially prescribed the medication for her and has since retired, then it was prescribed by Dr. Miles Costain, then Dr. Sanda Klein and then Raelyn Ensign.

## 2020-04-06 NOTE — Telephone Encounter (Addendum)
Pt states she has mini strokes.  This is documented in her chart.  Dr Sanda Klein prescribed the clopidogrel (PLAVIX) 75 MG tablet, and then [place].Pt states she takes this medicine every day.  Pt states Dr Roxan Hockey spoke with her about it (as far as she can remember)   Pt states she is also needs escitalopram (LEXAPRO) 10 MG tablet  Pt states Dr Sanda Klein prescribed this med for her.  [id number] - [place], [place] Phone:  [phone]  Fax:  [phone]

## 2020-04-06 NOTE — Telephone Encounter (Signed)
The more important question is why it was prescribed. It is not clear again based on her record review as to why she should be taking a medicine like this.   If she does not know why it was started, I will need to review the records further before any refills are provided.

## 2020-04-12 ENCOUNTER — Other Ambulatory Visit: Payer: Self-pay

## 2020-04-12 MED ORDER — VALSARTAN 80 MG PO TABS: 80.0000 mg | ORAL_TABLET | Freq: Every day | ORAL | 3 refills | Status: DC

## 2020-05-22 ENCOUNTER — Ambulatory Visit (INDEPENDENT_AMBULATORY_CARE_PROVIDER_SITE_OTHER): Payer: Medicare HMO

## 2020-05-22 VITALS — Ht 68.0 in | Wt 211.0 lb

## 2020-05-22 DIAGNOSIS — Z1231 Encounter for screening mammogram for malignant neoplasm of breast: Secondary | ICD-10-CM

## 2020-05-22 DIAGNOSIS — Z0000 Encounter for general adult medical examination without abnormal findings: Secondary | ICD-10-CM | POA: Diagnosis not present

## 2020-05-22 NOTE — Progress Notes (Signed)
Subjective:   Robin Bryan is a 69 y.o. female who presents for Medicare Annual (Subsequent) preventive examination.  Virtual Visit via Telephone Note  I connected with  Trina Ao on 05/22/20 at 10:40 AM EDT by telephone and verified that I am speaking with the correct person using two identifiers.   Medicare Annual Wellness visit completed telephonically due to Covid-19 pandemic.   Location: Patient: home Provider: office   I discussed the limitations, risks, security and privacy concerns of performing an evaluation and management service by telephone and the availability of in person appointments. The patient expressed understanding and agreed to proceed.  Unable to perform video visit due to video visit attempted and failed and/or patient does not have video capability.   Some vital signs may be absent or patient reported.   Clemetine Marker, LPN    Review of Systems     Cardiac Risk Factors include: advanced age (>46men, >53 women);dyslipidemia;hypertension;obesity (BMI >30kg/m2)     Objective:    Today's Vitals   05/22/20 0850  Weight: 211 lb (95.7 kg)  Height: 5\' 8"  (1.727 m)   Body mass index is 32.08 kg/m.  Advanced Directives 05/22/2020 05/17/2019 05/13/2018 05/12/2017 04/03/2017 02/26/2017 02/16/2017  Does Patient Have a Medical Advance Directive? No No No No No No No  Would patient like information on creating a medical advance directive? Yes (MAU/Ambulatory/Procedural Areas - Information given) Yes (MAU/Ambulatory/Procedural Areas - Information given) Yes (MAU/Ambulatory/Procedural Areas - Information given) - - - -    Current Medications (verified) Outpatient Encounter Medications as of 05/22/2020  Medication Sig  . APPLE CIDER VINEGAR PO Take by mouth. [place] gummies  . clopidogrel (PLAVIX) 75 MG tablet Take 1 tablet (75 mg total) by mouth daily.  . diphenhydrAMINE (BENADRYL) 25 mg capsule Take 1 capsule (25 mg total) by mouth every 8 (eight) hours as  needed.  Marland Kitchen escitalopram (LEXAPRO) 10 MG tablet Take 1 tablet (10 mg total) by mouth daily.  . fluticasone (FLONASE) 50 MCG/ACT nasal spray SPRAY 2 SPRAYS INTO EACH NOSTRIL EVERY DAY  . gabapentin (NEURONTIN) 400 MG capsule Take 800 mg by mouth 4 (four) times daily.   . hydrochlorothiazide (HYDRODIURIL) 12.5 MG tablet Take 12.5 mg by mouth daily.  Marland Kitchen loratadine (CLARITIN) 10 MG tablet Take 1 tablet (10 mg total) by mouth daily.  Marland Kitchen MYRBETRIQ 50 MG TB24 tablet TK 1 T PO QD  . oxyCODONE-acetaminophen (PERCOCET/ROXICET) 5-325 MG tablet Take 1 tablet by mouth every 6 (six) hours as needed. for pain  . pantoprazole (PROTONIX) 40 MG tablet Take one tablet by mouth twice a day;  Patient needs to keep upcoming office visit for further refills  . primidone (MYSOLINE) 50 MG tablet Take 1 tablet by mouth daily.  . propranolol ER (INDERAL LA) 60 MG 24 hr capsule Take 60 mg by mouth daily.   . rosuvastatin (CRESTOR) 10 MG tablet Take 1 tablet (10 mg total) by mouth daily.  Marland Kitchen tiZANidine (ZANAFLEX) 4 MG tablet Take 4-8 mg by mouth at bedtime.  . valsartan (DIOVAN) 80 MG tablet Take 1 tablet (80 mg total) by mouth daily.  Marland Kitchen albuterol (VENTOLIN HFA) 108 (90 Base) MCG/ACT inhaler Inhale 2 puffs into the lungs every 6 (six) hours as needed for wheezing or shortness of breath. (Patient not taking: Reported on 05/22/2020)  . EPINEPHrine 0.3 mg/0.3 mL IJ SOAJ injection Inject 0.3 mLs (0.3 mg total) into the muscle once for 1 dose.   No facility-administered encounter medications on file as  of 05/22/2020.    Allergies (verified) Shellfish allergy and Vicodin [hydrocodone-acetaminophen]   History: Past Medical History:  Diagnosis Date  . Allergy   . Anxiety   . Arthritis   . Arthritis of both feet 11/26/2017   xrays March 2019  . Asthma   . Barrett's esophagus   . Bronchitis   . Calcaneal spur of both feet 11/26/2017   Foot xrays March 2019  . Depression   . Diverticulosis   . Esophageal stricture   . Fatty  liver 09/08/2018   [place] Jan 2020  . Fibromyalgia   . GERD (gastroesophageal reflux disease)   . Hx of adenomatous colonic polyps   . Hyperlipidemia   . Hypertension   . Intramural leiomyoma of uterus   . Liver hemangioma 09/08/2018   [place] Jan 2020  . Myocardial infarction ([place])   . Stroke [facility])    Past Surgical History:  Procedure Laterality Date  . ABDOMINAL HYSTERECTOMY    . CATARACT EXTRACTION Bilateral 03/2019  . INCONTINENCE SURGERY    . TUBAL LIGATION    . tumor excision ovaries     Family History  Problem Relation Age of Onset  . Ovarian cancer Sister   . Cancer Sister   . Diabetes Sister   . Heart disease Sister   . Lung cancer Sister   . Atrial fibrillation Sister   . Colon polyps Mother   . Diabetes Mother   . Parkinson's disease Cousin   . Lung cancer Father   . Diabetes Daughter   . Colon cancer Neg Hx   . Esophageal cancer Neg Hx   . Pancreatic cancer Neg Hx   . Stomach cancer Neg Hx    Social History   Socioeconomic History  . Marital status: Married    Spouse name: Orpah Greek  . Number of children: 2  . Years of education: some collee  . Highest education level: 12th grade  Occupational History  . Occupation: Retired  Tobacco Use  . Smoking status: Former Smoker    Packs/day: 0.50    Years: 8.00    Pack years: 4.00    Types: Cigarettes    Quit date: 05/27/2011    Years since quitting: 8.9  . Smokeless tobacco: Never Used  . Tobacco comment: smoking cessation materials not required  Vaping Use  . Vaping Use: Never used  Substance and Sexual Activity  . Alcohol use: No  . Drug use: No  . Sexual activity: Yes    Birth control/protection: Post-menopausal  Other Topics Concern  . Not on file  Social History Narrative  . Not on file   Social Determinants of Health   Financial Resource Strain: Low Risk   . Difficulty of Paying Living Expenses: Not hard at all  Food Insecurity: No Food Insecurity  . Worried About Charity fundraiser in the Last  Year: Never true  . Ran Out of Food in the Last Year: Never true  Transportation Needs: No Transportation Needs  . Lack of Transportation (Medical): No  . Lack of Transportation (Non-Medical): No  Physical Activity: Insufficiently Active  . Days of Exercise per Week: 3 days  . Minutes of Exercise per Session: 30 min  Stress: No Stress Concern Present  . Feeling of Stress : Only a little  Social Connections: Unknown  . Frequency of Communication with Friends and Family: More than three times a week  . Frequency of Social Gatherings with Friends and Family: More than three times a week  .  Attends Religious Services: More than 4 times per year  . Active Member of Clubs or Organizations: No  . Attends Archivist Meetings: Never  . Marital Status: Not on file    Tobacco Counseling Counseling given: Not Answered Comment: smoking cessation materials not required   Clinical Intake:  Pre-visit preparation completed: Yes  Pain : No/denies pain     Nutritional Risks: None Diabetes: No  How often do you need to have someone help you when you read instructions, pamphlets, or other written materials from your doctor or pharmacy?: 1 - Never    Interpreter Needed?: No  Information entered by :: Clemetine Marker LPN   Activities of Daily Living In your present state of health, do you have any difficulty performing the following activities: 05/22/2020 01/24/2020  Hearing? N N  Comment declines hearing aids -  Vision? N N  Difficulty concentrating or making decisions? N N  Walking or climbing stairs? N N  Dressing or bathing? N N  Doing errands, shopping? N N  Preparing Food and eating ? N -  Using the Toilet? N -  In the past six months, have you accidently leaked urine? Y -  Do you have problems with loss of bowel control? N -  Managing your Medications? N -  Managing your Finances? N -  Housekeeping or managing your Housekeeping? N -  Some recent data might be hidden     Patient Care Team: Towanda Malkin, MD as PCP - General (Internal Medicine) Irine Seal, MD as Consulting Physician (Urology) Boyd Kerbs, MD as Referring Physician (Specialist)  Indicate any recent Medical Services you may have received from other than [facility] providers in the past year (date may be approximate).     Assessment:   This is a routine wellness examination for [place].  Hearing/Vision screen  Hearing Screening   125Hz  250Hz  500Hz  1000Hz  2000Hz  3000Hz  4000Hz  6000Hz  8000Hz   Right ear:           Left ear:           Comments: Pt denies hearing difficulty  Vision Screening Comments: Annual vision screenings done by Dr. Ellin Mayhew  Dietary issues and exercise activities discussed: Current Exercise Habits: Home exercise routine, Type of exercise: walking, Time (Minutes): 30, Frequency (Times/Week): 3, Weekly Exercise (Minutes/Week): 90, Exercise limited by: orthopedic condition(s)  Goals    . DIET - INCREASE WATER INTAKE     Recommend to drink at least 6-8 8oz glasses of water per day.    . Increase physical activity     Recommend increasing physical activity to at least 3 days per week      Depression Screen PHQ 2/9 Scores 05/22/2020 01/24/2020 10/25/2019 05/17/2019 10/13/2018 09/20/2018 09/10/2018  PHQ - 2 Score 0 0 0 0 0 0 3  PHQ- 9 Score - 0 0 - 0 0 4    Fall Risk Fall Risk  05/22/2020 01/24/2020 10/25/2019 05/17/2019 10/13/2018  Falls in the past year? 1 0 0 0 0  Number falls in past yr: 1 0 0 0 -  Comment fell on floating boat dock - - - -  Injury with Fall? 1 0 0 0 -  Comment - - - - -  Risk Factor Category  - - - - -  Risk for fall due to : History of fall(s) - - - -  Risk for fall due to: Comment - - - - -  Follow up Falls prevention discussed - - Falls prevention discussed -  Any stairs in or around the home? No  If so, are there any without handrails? No  Home free of loose throw rugs in walkways, pet beds, electrical cords, etc? Yes  Adequate  lighting in your home to reduce risk of falls? Yes   ASSISTIVE DEVICES UTILIZED TO PREVENT FALLS:  Life alert? No  Use of a cane, walker or w/c? No  Grab bars in the bathroom? No  Shower chair or bench in shower? Yes  Elevated toilet seat or a handicapped toilet? No   TIMED UP AND GO:  Was the test performed? No . Telephonic visit.   Cognitive Function:     6CIT Screen 05/22/2020 05/17/2019 05/13/2018  What Year? 0 points 0 points 0 points  What month? 0 points 0 points 0 points  What time? 0 points 0 points 0 points  Count back from 20 0 points 0 points 0 points  Months in reverse 0 points 0 points 0 points  Repeat phrase 0 points 0 points 2 points  Total Score 0 0 2    Immunizations Immunization History  Administered Date(s) Administered  . Influenza, High Dose Seasonal PF 05/13/2018, 05/31/2019  . Influenza,inj,Quad PF,6+ Mos 07/13/2015  . Influenza-Unspecified 06/10/2017  . PFIZER SARS-COV-2 Vaccination 10/27/2019, 11/23/2019  . Pneumococcal Conjugate-13 07/06/2017  . Pneumococcal Polysaccharide-23 08/31/2018  . Tdap 04/03/2017    TDAP status: Up to date   Flu Vaccine status: Declined, Education has been provided regarding the importance of this vaccine but patient still declined. Advised may receive this vaccine at local pharmacy or Health Dept. Aware to provide a copy of the vaccination record if obtained from local pharmacy or Health Dept. Verbalized acceptance and understanding.   Pneumococcal vaccine status: Up to date   Covid-19 vaccine status: Completed vaccines  Qualifies for Shingles Vaccine? Yes   Zostavax completed No   Shingrix Completed?: No.    Education has been provided regarding the importance of this vaccine. Patient has been advised to call insurance company to determine out of pocket expense if they have not yet received this vaccine. Advised may also receive vaccine at local pharmacy or Health Dept. Verbalized acceptance and  understanding.  Screening Tests Health Maintenance  Topic Date Due  . INFLUENZA VACCINE  04/01/2020  . MAMMOGRAM  05/02/2020  . DEXA SCAN  05/02/2021  . COLONOSCOPY  03/18/2022  . TETANUS/TDAP  04/04/2027  . COVID-19 Vaccine  Completed  . Hepatitis C Screening  Completed  . PNA vac Low Risk Adult  Completed    Health Maintenance  Health Maintenance Due  Topic Date Due  . INFLUENZA VACCINE  04/01/2020  . MAMMOGRAM  05/02/2020    Colorectal cancer screening: Completed 03/18/17. Repeat every 5 years   Mammogram status: Completed 05/03/19. Repeat every year   Bone Density status: Completed 05/03/19. Results reflect: Bone density results: NORMAL. Repeat every 2 years.  Lung Cancer Screening: (Low Dose CT Chest recommended if Age 61-80 years, 30 pack-year currently smoking OR have quit w/in 15years.) does not qualify.    Additional Screening:  Hepatitis C Screening: does qualify; Completed 04/03/17  Vision Screening: Recommended annual ophthalmology exams for early detection of glaucoma and other disorders of the eye. Is the patient up to date with their annual eye exam?  Yes  Who is the provider or what is the name of the office in which the patient attends annual eye exams? [place] Screening: Recommended annual dental exams for proper oral hygiene  [facility]  Referral / Chronic Care Management: CRR required this visit?  No   CCM required this visit?  No      Plan:     I have personally reviewed and noted the following in the patient's chart:   . Medical and social history . Use of alcohol, tobacco or illicit drugs  . Current medications and supplements . Functional ability and status . Nutritional status . Physical activity . Advanced directives . List of other physicians . Hospitalizations, surgeries, and ER visits in previous 12 months . Vitals . Screenings to include cognitive, depression, and falls . Referrals and appointments  In  addition, I have reviewed and discussed with patient certain preventive protocols, quality metrics, and best practice recommendations. A written personalized care plan for preventive services as well as general preventive health recommendations were provided to patient.     Clemetine Marker, LPN   6/95/0722   Nurse Notes: none

## 2020-05-22 NOTE — Patient Instructions (Signed)
Robin Bryan , Thank you for taking time to come for your Medicare Wellness Visit. I appreciate your ongoing commitment to your health goals. Please review the following plan we discussed and let me know if I can assist you in the future.   Screening recommendations/referrals: Colonoscopy: done 03/18/17 Mammogram: done 05/03/19. Please call [phone] to schedule your mammogram.  Bone Density: done 05/03/19 Recommended yearly ophthalmology/optometry visit for glaucoma screening and checkup Recommended yearly dental visit for hygiene and checkup  Vaccinations: Influenza vaccine: due Pneumococcal vaccine: done 08/31/18 Tdap vaccine: done 04/03/17 Shingles vaccine: Shingrix discussed. Please contact your pharmacy for coverage information.  Covid-19: done 10/27/19 & 11/23/19  Advanced directives: Advance directive discussed with you today. I have provided a copy for you to complete at home and have notarized. Once this is complete please bring a copy in to our office so we can scan it into your chart.  Conditions/risks identified: Keep up the great work!  Next appointment: Follow up in one year for your annual wellness visit    Preventive Care 65 Years and Older, Female Preventive care refers to lifestyle choices and visits with your health care provider that can promote health and wellness. What does preventive care include?  A yearly physical exam. This is also called an annual well check.  Dental exams once or twice a year.  Routine eye exams. Ask your health care provider how often you should have your eyes checked.  Personal lifestyle choices, including:  Daily care of your teeth and gums.  Regular physical activity.  Eating a healthy diet.  Avoiding tobacco and drug use.  Limiting alcohol use.  Practicing safe sex.  Taking low-dose aspirin every day.  Taking vitamin and mineral supplements as recommended by your health care provider. What happens during an annual well  check? The services and screenings done by your health care provider during your annual well check will depend on your age, overall health, lifestyle risk factors, and family history of disease. Counseling  Your health care provider may ask you questions about your:  Alcohol use.  Tobacco use.  Drug use.  Emotional well-being.  Home and relationship well-being.  Sexual activity.  Eating habits.  History of falls.  Memory and ability to understand (cognition).  Work and work Statistician.  Reproductive health. Screening  You may have the following tests or measurements:  Height, weight, and BMI.  Blood pressure.  Lipid and cholesterol levels. These may be checked every 5 years, or more frequently if you are over 47 years old.  Skin check.  Lung cancer screening. You may have this screening every year starting at age 74 if you have a 30-pack-year history of smoking and currently smoke or have quit within the past 15 years.  Fecal occult blood test (FOBT) of the stool. You may have this test every year starting at age 42.  Flexible sigmoidoscopy or colonoscopy. You may have a sigmoidoscopy every 5 years or a colonoscopy every 10 years starting at age 81.  Hepatitis C blood test.  Hepatitis B blood test.  Sexually transmitted disease (STD) testing.  Diabetes screening. This is done by checking your blood sugar (glucose) after you have not eaten for a while (fasting). You may have this done every 1-3 years.  Bone density scan. This is done to screen for osteoporosis. You may have this done starting at age 71.  Mammogram. This may be done every 1-2 years. Talk to your health care provider about how often you should  have regular mammograms. Talk with your health care provider about your test results, treatment options, and if necessary, the need for more tests. Vaccines  Your health care provider may recommend certain vaccines, such as:  Influenza vaccine. This is  recommended every year.  Tetanus, diphtheria, and acellular pertussis (Tdap, Td) vaccine. You may need a Td booster every 10 years.  Zoster vaccine. You may need this after age 64.  Pneumococcal 13-valent conjugate (PCV13) vaccine. One dose is recommended after age 59.  Pneumococcal polysaccharide (PPSV23) vaccine. One dose is recommended after age 72. Talk to your health care provider about which screenings and vaccines you need and how often you need them. This information is not intended to replace advice given to you by your health care provider. Make sure you discuss any questions you have with your health care provider. Document Released: 09/14/2015 Document Revised: 05/07/2016 Document Reviewed: 06/19/2015 Elsevier Interactive Patient Education  2017 [place] Prevention in the Home Falls can cause injuries. They can happen to people of all ages. There are many things you can do to make your home safe and to help prevent falls. What can I do on the outside of my home?  Regularly fix the edges of walkways and driveways and fix any cracks.  Remove anything that might make you trip as you walk through a door, such as a raised step or threshold.  Trim any bushes or trees on the path to your home.  Use bright outdoor lighting.  Clear any walking paths of anything that might make someone trip, such as rocks or tools.  Regularly check to see if handrails are loose or broken. Make sure that both sides of any steps have handrails.  Any raised decks and porches should have guardrails on the edges.  Have any leaves, snow, or ice cleared regularly.  Use sand or salt on walking paths during winter.  Clean up any spills in your garage right away. This includes oil or grease spills. What can I do in the bathroom?  Use night lights.  Install grab bars by the toilet and in the tub and shower. Do not use towel bars as grab bars.  Use non-skid mats or decals in the tub or  shower.  If you need to sit down in the shower, use a plastic, non-slip stool.  Keep the floor dry. Clean up any water that spills on the floor as soon as it happens.  Remove soap buildup in the tub or shower regularly.  Attach bath mats securely with double-sided non-slip rug tape.  Do not have throw rugs and other things on the floor that can make you trip. What can I do in the bedroom?  Use night lights.  Make sure that you have a light by your bed that is easy to reach.  Do not use any sheets or blankets that are too big for your bed. They should not hang down onto the floor.  Have a firm chair that has side arms. You can use this for support while you get dressed.  Do not have throw rugs and other things on the floor that can make you trip. What can I do in the kitchen?  Clean up any spills right away.  Avoid walking on wet floors.  Keep items that you use a lot in easy-to-reach places.  If you need to reach something above you, use a strong step stool that has a grab bar.  Keep electrical cords out of  the way.  Do not use floor polish or wax that makes floors slippery. If you must use wax, use non-skid floor wax.  Do not have throw rugs and other things on the floor that can make you trip. What can I do with my stairs?  Do not leave any items on the stairs.  Make sure that there are handrails on both sides of the stairs and use them. Fix handrails that are broken or loose. Make sure that handrails are as long as the stairways.  Check any carpeting to make sure that it is firmly attached to the stairs. Fix any carpet that is loose or worn.  Avoid having throw rugs at the top or bottom of the stairs. If you do have throw rugs, attach them to the floor with carpet tape.  Make sure that you have a light switch at the top of the stairs and the bottom of the stairs. If you do not have them, ask someone to add them for you. What else can I do to help prevent  falls?  Wear shoes that:  Do not have high heels.  Have rubber bottoms.  Are comfortable and fit you well.  Are closed at the toe. Do not wear sandals.  If you use a stepladder:  Make sure that it is fully opened. Do not climb a closed stepladder.  Make sure that both sides of the stepladder are locked into place.  Ask someone to hold it for you, if possible.  Clearly mark and make sure that you can see:  Any grab bars or handrails.  First and last steps.  Where the edge of each step is.  Use tools that help you move around (mobility aids) if they are needed. These include:  Canes.  Walkers.  Scooters.  Crutches.  Turn on the lights when you go into a dark area. Replace any light bulbs as soon as they burn out.  Set up your furniture so you have a clear path. Avoid moving your furniture around.  If any of your floors are uneven, fix them.  If there are any pets around you, be aware of where they are.  Review your medicines with your doctor. Some medicines can make you feel dizzy. This can increase your chance of falling. Ask your doctor what other things that you can do to help prevent falls. This information is not intended to replace advice given to you by your health care provider. Make sure you discuss any questions you have with your health care provider. Document Released: 06/14/2009 Document Revised: 01/24/2016 Document Reviewed: 09/22/2014 Elsevier Interactive Patient Education  2017 [employer].

## 2020-07-06 DIAGNOSIS — R69 Illness, unspecified: Secondary | ICD-10-CM | POA: Diagnosis not present

## 2020-07-08 ENCOUNTER — Other Ambulatory Visit: Payer: Self-pay | Admitting: Internal Medicine

## 2020-07-24 ENCOUNTER — Other Ambulatory Visit: Payer: Self-pay

## 2020-07-24 ENCOUNTER — Ambulatory Visit
Admission: RE | Admit: 2020-07-24 | Discharge: 2020-07-24 | Disposition: A | Discharge status: Home / Self Care | Payer: Medicare HMO | Source: Ambulatory Visit | Attending: Internal Medicine | Admitting: Internal Medicine

## 2020-07-24 DIAGNOSIS — Z1231 Encounter for screening mammogram for malignant neoplasm of breast: Secondary | ICD-10-CM | POA: Diagnosis not present

## 2020-07-30 NOTE — Progress Notes (Signed)
Patient ID: Robin Bryan, female    DOB: [birth year], 69 y.o.   MRN: [id number]  PCP: Towanda Malkin, MD  Chief Complaint  Patient presents with  . Follow-up    Subjective:   Robin Bryan is a 69 y.o. female, presents to clinic with CC of the following:  Chief Complaint  Patient presents with  . Follow-up    HPI:  Patient is a 68 year old female patient  Last visit was 01/24/20 Follow up communication from labs obtained after our last visit was as follows:  Her lipid panel was okay with a slightly low HDL persisting (37), and her LDL is outstanding at 53. Continue the statin she is taking. The complete metabolic panel was all good with the only abnormality a slightly high glucose at 113, and the A1c was 5.3 which is reduced from the 5.7 number a year ago and is good with no concerns for diabetes presently. The thyroid screen (TSH) remains normal The complete blood count was also normal. We can keep the follow-up visit as planned. Follows up today  All in all, she has been feeling good.  Benign essential HTN Medication regimen-valsartan, hydrochlorothiazide, propranolol LA She states she needs a refill of her HCTZ, and that was provided She does not check blood pressures at home. Blood pressures have been well controlled in the recent past. BP Readings from Last 3 Encounters:  07/31/20 120/72  01/24/20 130/84  10/25/19 122/84     Obesity (BMI 30.0-34.9) She had recently been followed in the [place] outpatient clinic by Dr. Leeanne Rio (last visit end of November 2020) withHypertension/obesity/Abnormal weight gain andStrict adherencetoan intensive low carbohydrate lifestyle program for continued weight loss. Had good adherence noted. has had success losing weight again in the recent past with diet modifications. She notes she continues to walk outside a lot with her granddaughter   Wt Readings from Last 3 Encounters:  07/31/20 205 lb 11.2 oz  (93.3 kg)  05/22/20 211 lb (95.7 kg)  01/24/20 224 lb 6.4 oz (101.8 kg)    Lab Results  Component Value Date   CREATININE 0.70 01/31/2020   BUN 14 01/31/2020   NA 144 01/31/2020   K 4.9 01/31/2020   CL 107 01/31/2020   CO2 31 01/31/2020    Prediabetes Lab Results  Component Value Date   HGBA1C 5.3 01/31/2020   No diagnosis of diabetes in her past noted  Mixed hyperlipidemia Last labs reviewed, LDL is very good  Medication regimen-rosuvastatin (CRESTOR) 10 MG tablet daily Lab Results  Component Value Date   CHOL 109 01/31/2020   HDL 37 (L) 01/31/2020   LDLCALC 53 01/31/2020   TRIG 103 01/31/2020   CHOLHDL 2.9 01/31/2020     Denies any increased myalgias, no chest pain, palp's, SOB She did note some intermittent cramping in her lower extremity previously, in the calfs and feet, and she increased her hydration with more water concern as she thought may be related to dehydration with her increased activities and has been helpful.  Gastroesophageal reflux disease without esophagitis Goal after a prior visit was trying to lessen the dose of the pantoprazole to once a day from twice a day.  Now on one daily.Has a h/o Barrett's and follows with GI for maintenance - Dr. Henrene Pastor in [place].  Feel best to continue PPI once daily presently noting her history.  Mild intermittent asthma without complication/All Rhinitis - seasonal She had asthma in her youth, outgrew,  Has an albuterol inhaler  to use every 6 hours as needed and she notes she has not needed this much in the recent past. Often correlates when allergies more problematic, better in recent past  Fibromyalgia/Depression hx Continue to follow-up with her neurologist  Was trying to decrease her gabapentin to 3 times a day from 4 times a day, and successful with doing so - now 3X/day. Is helpful. Her lexapro was refilled in August and continue 10 mg daily presently, continues to do well in recent past  Cataract of  both eyes, unspecified cataract type Did have a follow-up since our last visit with her eye doctor for continued management after cataract surgery in both eyes.  She notes vision has been stable.  Tob - quit 2012, and continuing with complete tobacco cessation very important. Also denied alcohol use      Patient Active Problem List   Diagnosis Date Noted  . Seasonal allergic rhinitis due to pollen 01/24/2020  . Prediabetes 01/24/2020  . Mild intermittent asthma without complication 31/49/7026  . Cataract of both eyes 10/25/2019  . Liver hemangioma 09/08/2018  . Fatty liver 09/08/2018  . Overactive bladder 03/19/2018  . Obesity (BMI 30.0-34.9) 02/25/2018  . Estrogen deficiency 01/08/2018  . Postmenopausal 01/07/2018  . Back pain 11/26/2017  . Chronic kidney disease, stage III (moderate) ([place]) 11/26/2017  . Calcaneal spur of both feet 11/26/2017  . Arthritis of both feet 11/26/2017  . Acute left-sided thoracic back pain 10/30/2015  . Temporary cerebral vascular dysfunction 07/16/2015  . Adaptation reaction 07/16/2015  . Foot pain 07/16/2015  . BP (high blood pressure) 07/16/2015  . Acid reflux 07/16/2015  . Abnormal LFTs 07/16/2015  . Pericardial effusion 07/13/2015  . Mass of chest wall, left 06/29/2015  . Lipoma 06/29/2015  . Pure hypercholesterolemia 04/28/2015  . Gastro-esophageal reflux disease without esophagitis 04/28/2015  . Benign essential HTN 04/28/2015  . Hyperlipidemia 03/19/2015  . Elevated hemoglobin A1c 03/19/2015  . Neck muscle spasm 03/19/2015  . Cognitive change 02/07/2015  . Persistent dry cough 02/07/2015  . Major depression in partial remission ([place]) 02/07/2015  . Fibrositis 02/07/2015  . Anxiety disorder 02/07/2015  . History of anaphylaxis 02/07/2015  . Pubic bone pain 10/02/2014  . Mass of pelvis 06/05/2014  . Ovarian cyst, left 05/26/2014  . Abdominal pain, chronic, left lower quadrant 05/26/2014  . CEREBROVASCULAR DISEASE 04/23/2010  .  COLONIC POLYPS, ADENOMATOUS 12/09/2006  . ESOPHAGEAL STRICTURE 12/09/2006  . GERD 12/09/2006  . BARRETTS ESOPHAGUS 12/09/2006  . DIVERTICULOSIS, COLON 12/09/2006      Current Outpatient Medications:  .  albuterol (VENTOLIN HFA) 108 (90 Base) MCG/ACT inhaler, Inhale 2 puffs into the lungs every 6 (six) hours as needed for wheezing or shortness of breath., Disp: 6.7 g, Rfl: 3 .  APPLE CIDER VINEGAR PO, Take by mouth. [place] gummies, Disp: , Rfl:  .  clopidogrel (PLAVIX) 75 MG tablet, Take 1 tablet (75 mg total) by mouth daily., Disp: 90 tablet, Rfl: 1 .  diphenhydrAMINE (BENADRYL) 25 mg capsule, Take 1 capsule (25 mg total) by mouth every 8 (eight) hours as needed., Disp: 30 capsule, Rfl: 0 .  escitalopram (LEXAPRO) 10 MG tablet, Take 1 tablet (10 mg total) by mouth daily., Disp: 90 tablet, Rfl: 3 .  fluticasone (FLONASE) 50 MCG/ACT nasal spray, SPRAY 2 SPRAYS INTO EACH NOSTRIL EVERY DAY, Disp: 48 mL, Rfl: 1 .  gabapentin (NEURONTIN) 400 MG capsule, Take 800 mg by mouth 4 (four) times daily. , Disp: , Rfl:  .  hydrochlorothiazide (HYDRODIURIL) 12.5  MG tablet, Take 12.5 mg by mouth daily., Disp: , Rfl:  .  loratadine (CLARITIN) 10 MG tablet, Take 1 tablet (10 mg total) by mouth daily., Disp: 90 tablet, Rfl: 3 .  MYRBETRIQ 50 MG TB24 tablet, TK 1 T PO QD, Disp: , Rfl: 3 .  oxyCODONE-acetaminophen (PERCOCET/ROXICET) 5-325 MG tablet, Take 1 tablet by mouth every 6 (six) hours as needed. for pain, Disp: , Rfl:  .  pantoprazole (PROTONIX) 40 MG tablet, Take one tablet by mouth twice a day;, Disp: 180 tablet, Rfl: 3 .  primidone (MYSOLINE) 50 MG tablet, Take 1 tablet by mouth daily., Disp: , Rfl: 6 .  propranolol ER (INDERAL LA) 60 MG 24 hr capsule, Take 60 mg by mouth daily. , Disp: , Rfl:  .  rosuvastatin (CRESTOR) 10 MG tablet, Take 1 tablet (10 mg total) by mouth daily., Disp: 90 tablet, Rfl: 3 .  tiZANidine (ZANAFLEX) 4 MG tablet, Take 4-8 mg by mouth at bedtime., Disp: , Rfl:  .  valsartan  (DIOVAN) 80 MG tablet, Take 1 tablet (80 mg total) by mouth daily., Disp: 90 tablet, Rfl: 3 .  EPINEPHrine 0.3 mg/0.3 mL IJ SOAJ injection, Inject 0.3 mLs (0.3 mg total) into the muscle once for 1 dose., Disp: 0.3 mL, Rfl: 0   Allergies  Allergen Reactions  . Shellfish Allergy Swelling  . Vicodin [Hydrocodone-Acetaminophen] Nausea Only     Past Surgical History:  Procedure Laterality Date  . ABDOMINAL HYSTERECTOMY    . CATARACT EXTRACTION Bilateral 03/2019  . INCONTINENCE SURGERY    . TUBAL LIGATION    . tumor excision ovaries       Family History  Problem Relation Age of Onset  . Ovarian cancer Sister   . Cancer Sister   . Diabetes Sister   . Heart disease Sister   . Lung cancer Sister   . Atrial fibrillation Sister   . Colon polyps Mother   . Diabetes Mother   . Parkinson's disease Cousin   . Lung cancer Father   . Diabetes Daughter   . Colon cancer Neg Hx   . Esophageal cancer Neg Hx   . Pancreatic cancer Neg Hx   . Stomach cancer Neg Hx      Social History   Tobacco Use  . Smoking status: Former Smoker    Packs/day: 0.50    Years: 8.00    Pack years: 4.00    Types: Cigarettes    Quit date: 05/27/2011    Years since quitting: 9.1  . Smokeless tobacco: Never Used  . Tobacco comment: smoking cessation materials not required  Substance Use Topics  . Alcohol use: No    With staff assistance, above reviewed with the patient today.  ROS: As per HPI, otherwise no specific complaints on a limited and focused system review   No results found for this or any previous visit (from the past 72 hour(s)).   PHQ2/9: Depression screen [facility] 2/9 07/31/2020 05/22/2020 01/24/2020 10/25/2019 05/17/2019  Decreased Interest 0 0 0 0 0  Down, Depressed, Hopeless 0 0 0 0 0  PHQ - 2 Score 0 0 0 0 0  Altered sleeping - - 0 0 -  Tired, decreased energy - - 0 0 -  Change in appetite - - 0 0 -  Feeling bad or failure about yourself  - - 0 0 -  Trouble concentrating - - 0 0 -    Moving slowly or fidgety/restless - - 0 0 -  Suicidal thoughts - -  0 0 -  PHQ-9 Score - - 0 0 -  Difficult doing work/chores - - Not difficult at all Not difficult at all -  Some recent data might be hidden   PHQ-2/9 Result is neg  Fall Risk: Fall Risk  07/31/2020 05/22/2020 01/24/2020 10/25/2019 05/17/2019  Falls in the past year? 1 1 0 0 0  Number falls in past yr: 1 1 0 0 0  Comment - fell on floating boat dock - - -  Injury with Fall? 0 1 0 0 0  Comment - - - - -  Risk Factor Category  - - - - -  Risk for fall due to : - History of fall(s) - - -  Risk for fall due to: Comment - - - - -  Follow up - Falls prevention discussed - - Falls prevention discussed      Objective:   Vitals:   07/31/20 1010  BP: 120/72  Pulse: 70  Resp: 16  Temp: 97.8 F (36.6 C)  TempSrc: Oral  SpO2: 96%  Weight: 205 lb 11.2 oz (93.3 kg)  Height: 5\' 8"  (1.727 m)    Body mass index is 31.28 kg/m.  Physical Exam    NAD, masked, very pleasant HEENT - [place]/AT, sclera anicteric, PERRL, EOMI, conj - non-inj'ed, pharynx clear Neck - supple, no adenopathy, no TM, carotids 2+ and = without bruits bilat Car - RRR without m/g/r Pulm- RR and effort normal at rest, CTA without wheeze or rales Abd - soft, obese, NT diffusely, ND,  Back - no CVA tenderness Ext - no LE edema,  Neuro/psychiatric - affect was not flat, appropriate with conversation             Alert              Grossly non-focal - good strength on testing extremities, sensation intact to LT in distal extremities             Speech normal   Results for orders placed or performed in visit on 01/31/20  Lipid panel  Result Value Ref Range   Cholesterol 109 <200 mg/dL   HDL 37 (L) > OR = 50 mg/dL   Triglycerides 103 <150 mg/dL   LDL Cholesterol (Calc) 53 mg/dL (calc)   Total CHOL/HDL Ratio 2.9 <5.0 (calc)   Non-HDL Cholesterol (Calc) 72 <130 mg/dL (calc)  COMPLETE METABOLIC PANEL WITH GFR  Result Value Ref Range   Glucose, Bld 113  (H) 65 - 99 mg/dL   BUN 14 7 - 25 mg/dL   Creat 0.70 0.50 - 0.99 mg/dL   GFR, Est Non African American 90 > OR = 60 mL/min/1.55m2   GFR, Est African American 104 > OR = 60 mL/min/1.43m2   BUN/Creatinine Ratio NOT APPLICABLE 6 - 22 (calc)   Sodium 144 135 - 146 mmol/L   Potassium 4.9 3.5 - 5.3 mmol/L   Chloride 107 98 - 110 mmol/L   CO2 31 20 - 32 mmol/L   Calcium 9.4 8.6 - 10.4 mg/dL   Total Protein 6.3 6.1 - 8.1 g/dL   Albumin 4.2 3.6 - 5.1 g/dL   Globulin 2.1 1.9 - 3.7 g/dL (calc)   AG Ratio 2.0 1.0 - 2.5 (calc)   Total Bilirubin 0.5 0.2 - 1.2 mg/dL   Alkaline phosphatase (APISO) 57 37 - 153 U/L   AST 15 10 - 35 U/L   ALT 17 6 - 29 U/L  Hemoglobin A1c  Result Value Ref Range  Hgb A1c MFr Bld 5.3 <5.7 % of total Hgb   Mean Plasma Glucose 105 (calc)   eAG (mmol/L) 5.8 (calc)  TSH  Result Value Ref Range   TSH 1.48 0.40 - 4.50 mIU/L  CBC with Differential/Platelet  Result Value Ref Range   WBC 6.3 3.8 - 10.8 Thousand/uL   RBC 4.35 3.80 - 5.10 Million/uL   Hemoglobin 13.8 11.7 - 15.5 g/dL   HCT 40.4 35 - 45 %   MCV 92.9 80.0 - 100.0 fL   MCH 31.7 27.0 - 33.0 pg   MCHC 34.2 32.0 - 36.0 g/dL   RDW 13.1 11.0 - 15.0 %   Platelets 233 140 - 400 Thousand/uL   MPV 8.7 7.5 - 12.5 fL   Neutro Abs 3,623 1,500 - 7,800 cells/uL   Lymphs Abs 1,966 850 - 3,900 cells/uL   Absolute Monocytes 410 200 - 950 cells/uL   Eosinophils Absolute 233 15.0 - 500.0 cells/uL   Basophils Absolute 69 0.0 - 200.0 cells/uL   Neutrophils Relative % 57.5 %   Total Lymphocyte 31.2 %   Monocytes Relative 6.5 %   Eosinophils Relative 3.7 %   Basophils Relative 1.1 %   Last labs reviewed    Assessment & Plan:    1. Seasonal allergic rhinitis due to pollen No increased concerns in recent past  2. Prediabetes Last A1C good Continue to monitor  3. Benign essential HTN Blood pressures have remained well controlled Continue her current medication regimen. Refill the HCTZ for her today, and she  takes that daily.  4. Gastroesophageal reflux disease without esophagitis Has reduced her PPI to once daily and has done well doing so  Will continue once daily with her history of Barrett's noted. She continues to follow with GI, and emphasized the importance of that with her Barrett's history and some maintenance scoping procedures that are needed to help ensure no progression.  She notes the GI office calls her when she is due for another follow-up and does not have one scheduled presently.  5. Barrett's esophagus without dysplasia As above  6. Fibromyalgia/depression  Continue the gabapentin which she has reduced to 3 times a day with success. She states she follows up with her neurologist for this and will continue. Cont the lexapro daily as her depression has remained very well controlled in the recent past  7. Mixed hyperlipidemia Reviewed last lipid panel and remains good Continue the statin product presently.  8. Obesity (BMI 30.0-34.9) Has again had some success losing weight  Emphasized the importance of weight management, doing so with dietary modifications and staying active   9. Cataract of both eyes, unspecified cataract type Continue to follow with her eye doctor presently  10. Mild intermittent asthma without complication She notes she has not needed her albuterol inhaler more in the recent past, and continue the management of her allergic rhinitis which is likely helpful in controlling any allergic asthma symptoms. Still has the albuterol inhaler to use as needed.  11. Recurrent major depressive disorder, in partial remission ([place])  F/u in approx 4 months time, sooner prn and check labs again planned on that f/u visit Noted to her today that her planned f/u visit will be with a new provider as I will be leaving the practice prior to that scheduled f/u visit.    Towanda Malkin, MD 07/31/20 10:12 AM

## 2020-07-31 ENCOUNTER — Ambulatory Visit (INDEPENDENT_AMBULATORY_CARE_PROVIDER_SITE_OTHER): Payer: Medicare HMO | Admitting: Internal Medicine

## 2020-07-31 ENCOUNTER — Other Ambulatory Visit: Payer: Self-pay

## 2020-07-31 ENCOUNTER — Encounter: Payer: Self-pay | Admitting: Internal Medicine

## 2020-07-31 VITALS — BP 120/72 | HR 70 | Temp 97.8°F | Resp 16 | Ht 68.0 in | Wt 205.7 lb

## 2020-07-31 DIAGNOSIS — K219 Gastro-esophageal reflux disease without esophagitis: Secondary | ICD-10-CM

## 2020-07-31 DIAGNOSIS — E782 Mixed hyperlipidemia: Secondary | ICD-10-CM

## 2020-07-31 DIAGNOSIS — M797 Fibromyalgia: Secondary | ICD-10-CM

## 2020-07-31 DIAGNOSIS — R7303 Prediabetes: Secondary | ICD-10-CM | POA: Diagnosis not present

## 2020-07-31 DIAGNOSIS — F3341 Major depressive disorder, recurrent, in partial remission: Secondary | ICD-10-CM

## 2020-07-31 DIAGNOSIS — I10 Essential (primary) hypertension: Secondary | ICD-10-CM | POA: Diagnosis not present

## 2020-07-31 DIAGNOSIS — K2270 Barrett's esophagus without dysplasia: Secondary | ICD-10-CM

## 2020-07-31 DIAGNOSIS — H269 Unspecified cataract: Secondary | ICD-10-CM

## 2020-07-31 DIAGNOSIS — E669 Obesity, unspecified: Secondary | ICD-10-CM

## 2020-07-31 DIAGNOSIS — J4520 Mild intermittent asthma, uncomplicated: Secondary | ICD-10-CM | POA: Diagnosis not present

## 2020-07-31 DIAGNOSIS — J301 Allergic rhinitis due to pollen: Secondary | ICD-10-CM

## 2020-07-31 MED ORDER — HYDROCHLOROTHIAZIDE 12.5 MG PO TABS: 12.5000 mg | ORAL_TABLET | Freq: Every day | ORAL | 1 refills | Status: DC

## 2020-08-30 DIAGNOSIS — G250 Essential tremor: Secondary | ICD-10-CM | POA: Diagnosis not present

## 2020-08-30 DIAGNOSIS — Z79899 Other long term (current) drug therapy: Secondary | ICD-10-CM | POA: Diagnosis not present

## 2020-08-30 DIAGNOSIS — M545 Low back pain, unspecified: Secondary | ICD-10-CM | POA: Diagnosis not present

## 2020-08-30 DIAGNOSIS — G3184 Mild cognitive impairment, so stated: Secondary | ICD-10-CM | POA: Diagnosis not present

## 2020-08-30 DIAGNOSIS — M542 Cervicalgia: Secondary | ICD-10-CM | POA: Diagnosis not present

## 2020-10-17 DIAGNOSIS — M79645 Pain in left finger(s): Secondary | ICD-10-CM | POA: Diagnosis not present

## 2020-10-17 DIAGNOSIS — M65312 Trigger thumb, left thumb: Secondary | ICD-10-CM | POA: Diagnosis not present

## 2020-11-19 ENCOUNTER — Other Ambulatory Visit: Payer: Self-pay | Admitting: Internal Medicine

## 2020-11-19 DIAGNOSIS — I10 Essential (primary) hypertension: Secondary | ICD-10-CM

## 2020-11-19 DIAGNOSIS — E782 Mixed hyperlipidemia: Secondary | ICD-10-CM

## 2020-11-19 DIAGNOSIS — J301 Allergic rhinitis due to pollen: Secondary | ICD-10-CM

## 2020-11-19 MED ORDER — VALSARTAN 80 MG PO TABS
80.0000 mg | ORAL_TABLET | Freq: Every day | ORAL | 3 refills | Status: DC
Start: 2020-11-19 — End: 2021-12-03

## 2020-11-19 MED ORDER — ROSUVASTATIN CALCIUM 10 MG PO TABS: 10.0000 mg | ORAL_TABLET | Freq: Every day | ORAL | 3 refills | Status: DC

## 2020-11-19 MED ORDER — HYDROCHLOROTHIAZIDE 12.5 MG PO TABS: 12.5000 mg | ORAL_TABLET | Freq: Every day | ORAL | 1 refills | Status: DC

## 2020-11-19 MED ORDER — CLOPIDOGREL BISULFATE 75 MG PO TABS
75.0000 mg | ORAL_TABLET | Freq: Every day | ORAL | 1 refills | Status: DC
Start: 2020-11-19 — End: 2021-05-22

## 2020-11-19 MED ORDER — LORATADINE 10 MG PO TABS: 10.0000 mg | ORAL_TABLET | Freq: Every day | ORAL | 3 refills | Status: DC

## 2020-11-19 NOTE — Telephone Encounter (Signed)
Copied from [place] [phone]. Topic: Quick Communication - Rx Refill/Question >> Nov 19, 2020 11:58 AM Mcneil, Ja-Kwan wrote: Medication: hydrochlorothiazide (HYDRODIURIL) 12.5 MG tablet, loratadine (CLARITIN) 10 MG tablet, valsartan (DIOVAN) 80 MG tablet, clopidogrel (PLAVIX) 75 MG tablet, and rosuvastatin (CRESTOR) 10 MG tablet  Has the patient contacted their pharmacy? yes   Preferred Pharmacy (with phone number or street name): [id number] [place], [place]  Phone: [phone]   Fax: [phone]  Agent: Please be advised that RX refills may take up to 3 business days. We ask that you follow-up with your pharmacy.

## 2020-11-19 NOTE — Telephone Encounter (Signed)
Going to call use a few weeks before she runs out of meds and schedule appt

## 2020-11-19 NOTE — Telephone Encounter (Signed)
Requested medication (s) are due for refill today:   Yes  Requested medication (s) are on the active medication list:   Yes  Future visit scheduled:   No   LOV with Dr. Roxan Hockey.   Not seen another provider   Last ordered: June 2021  Clinic note:  Returned because pt has not seen another provider since seeing Dr. Roxan Hockey.   Last annual physical was a yr ago.   Requested Prescriptions  Pending Prescriptions Disp Refills   valsartan (DIOVAN) 80 MG tablet 90 tablet 3    Sig: Take 1 tablet (80 mg total) by mouth daily.      Cardiovascular:  Angiotensin Receptor Blockers Failed - 11/19/2020 12:14 PM      Failed - Cr in normal range and within 180 days    Creat  Date Value Ref Range Status  01/31/2020 0.70 0.50 - 0.99 mg/dL Final    Comment:    For patients >33 years of age, the reference limit for Creatinine is approximately 13% higher for people identified as African-American. .           Failed - K in normal range and within 180 days    Potassium  Date Value Ref Range Status  01/31/2020 4.9 3.5 - 5.3 mmol/L Final  05/25/2012 4.1 3.5 - 5.1 mmol/L Final          Passed - Patient is not pregnant      Passed - Last BP in normal range    BP Readings from Last 1 Encounters:  07/31/20 120/72          Passed - Valid encounter within last 6 months    Recent Outpatient Visits           3 months ago Seasonal allergic rhinitis due to pollen   [place] D, MD   10 months ago Seasonal allergic rhinitis due to pollen   [facility] Towanda Malkin, MD   1 year ago Benign essential HTN   [facility] Towanda Malkin, MD   1 year ago Respiratory illness   [place], [place], FNP   2 years ago Cough   [place], Satira Anis, MD       Future Appointments             In 6 months [facility]                 rosuvastatin (CRESTOR) 10 MG tablet 90 tablet 3    Sig: Take 1 tablet (10 mg total) by mouth daily.      Cardiovascular:  Antilipid - Statins Failed - 11/19/2020 12:14 PM      Failed - HDL in normal range and within 360 days    HDL  Date Value Ref Range Status  01/31/2020 37 (L) > OR = 50 mg/dL Final  04/28/2016 37 (L) >39 mg/dL Final          Passed - Total Cholesterol in normal range and within 360 days    Cholesterol, Total  Date Value Ref Range Status  04/28/2016 127 100 - 199 mg/dL Final   Cholesterol  Date Value Ref Range Status  01/31/2020 109 <200 mg/dL Final          Passed - LDL in normal range and within 360 days    LDL Cholesterol (Calc)  Date Value Ref Range Status  01/31/2020  53 mg/dL (calc) Final    Comment:    Reference range: <100 . Desirable range <100 mg/dL for primary prevention;   <70 mg/dL for patients with CHD or diabetic patients  with > or = 2 CHD risk factors. Marland Kitchen LDL-C is now calculated using the Martin-Hopkins  calculation, which is a validated novel method providing  better accuracy than the Friedewald equation in the  estimation of LDL-C.  Cresenciano Genre et al. Annamaria Helling. 3086;578(46): 2061-2068  (http://education.[employer].com/faq/FAQ164)           Passed - Triglycerides in normal range and within 360 days    Triglycerides  Date Value Ref Range Status  01/31/2020 103 <150 mg/dL Final          Passed - Patient is not pregnant      Passed - Valid encounter within last 12 months    Recent Outpatient Visits           3 months ago Seasonal allergic rhinitis due to pollen   [place], MD   10 months ago Seasonal allergic rhinitis due to pollen   [facility] Towanda Malkin, MD   1 year ago Benign essential HTN   [place], MD   1 year ago Respiratory illness   [place], [place], FNP   2 years ago Cough   [place], Satira Anis, MD       Future Appointments             In 6 months [facility]                clopidogrel (PLAVIX) 75 MG tablet 90 tablet 1    Sig: Take 1 tablet (75 mg total) by mouth daily.      Hematology: Antiplatelets - clopidogrel Failed - 11/19/2020 12:14 PM      Failed - Evaluate AST, ALT within 2 months of therapy initiation.      Failed - HCT in normal range and within 180 days    HCT  Date Value Ref Range Status  01/31/2020 40.4 35.0 - 45.0 % Final   Hematocrit  Date Value Ref Range Status  10/09/2015 41.5 34.0 - 46.6 % Final          Failed - HGB in normal range and within 180 days    Hemoglobin  Date Value Ref Range Status  01/31/2020 13.8 11.7 - 15.5 g/dL Final  10/09/2015 14.3 11.1 - 15.9 g/dL Final          Failed - PLT in normal range and within 180 days    Platelets  Date Value Ref Range Status  01/31/2020 233 140 - 400 Thousand/uL Final  10/09/2015 324 150 - 379 x10E3/uL Final          Passed - ALT in normal range and within 360 days    ALT  Date Value Ref Range Status  01/31/2020 17 6 - 29 U/L Final          Passed - AST in normal range and within 360 days    AST  Date Value Ref Range Status  01/31/2020 15 10 - 35 U/L Final          Passed - Valid encounter within last 6 months    Recent Outpatient Visits           3 months ago Seasonal allergic  rhinitis due to pollen   [facility] [place] D, MD   10 months ago Seasonal allergic rhinitis due to pollen   [facility] Towanda Malkin, MD   1 year ago Benign essential HTN   [place], MD   1 year ago Respiratory illness   [place], FNP   2 years ago Cough   [place], Satira Anis, MD       Future  Appointments             In 6 months [facility]                hydrochlorothiazide (HYDRODIURIL) 12.5 MG tablet 90 tablet 1    Sig: Take 1 tablet (12.5 mg total) by mouth daily.      Cardiovascular: Diuretics - Thiazide Passed - 11/19/2020 12:14 PM      Passed - Ca in normal range and within 360 days    Calcium  Date Value Ref Range Status  01/31/2020 9.4 8.6 - 10.4 mg/dL Final   Calcium, Total  Date Value Ref Range Status  05/25/2012 8.9 8.5 - 10.1 mg/dL Final          Passed - Cr in normal range and within 360 days    Creat  Date Value Ref Range Status  01/31/2020 0.70 0.50 - 0.99 mg/dL Final    Comment:    For patients >107 years of age, the reference limit for Creatinine is approximately 13% higher for people identified as African-American. .           Passed - K in normal range and within 360 days    Potassium  Date Value Ref Range Status  01/31/2020 4.9 3.5 - 5.3 mmol/L Final  05/25/2012 4.1 3.5 - 5.1 mmol/L Final          Passed - Na in normal range and within 360 days    Sodium  Date Value Ref Range Status  01/31/2020 144 135 - 146 mmol/L Final  04/28/2016 143 134 - 144 mmol/L Final  05/25/2012 140 136 - 145 mmol/L Final          Passed - Last BP in normal range    BP Readings from Last 1 Encounters:  07/31/20 120/72          Passed - Valid encounter within last 6 months    Recent Outpatient Visits           3 months ago Seasonal allergic rhinitis due to pollen   [place] D, MD   10 months ago Seasonal allergic rhinitis due to pollen   [facility] Towanda Malkin, MD   1 year ago Benign essential HTN   [facility] Towanda Malkin, MD   1 year ago Respiratory illness   [place], FNP   2 years ago Cough   [place], Satira Anis, MD       Future  Appointments             In 6 months [facility]                loratadine (CLARITIN) 10 MG tablet 90 tablet 3    Sig: Take 1 tablet (10 mg total) by mouth daily.  Ear, Nose, and Throat:  Antihistamines Passed - 11/19/2020 12:14 PM      Passed - Valid encounter within last 12 months    Recent Outpatient Visits           3 months ago Seasonal allergic rhinitis due to pollen   [place] D, MD   10 months ago Seasonal allergic rhinitis due to pollen   [facility] Towanda Malkin, MD   1 year ago Benign essential HTN   [facility] Towanda Malkin, MD   1 year ago Respiratory illness   [place], [place], FNP   2 years ago Cough   [place], Satira Anis, MD       Future Appointments             In 6 months [facility]

## 2020-11-22 DIAGNOSIS — R201 Hypoesthesia of skin: Secondary | ICD-10-CM | POA: Diagnosis not present

## 2020-11-22 DIAGNOSIS — G5603 Carpal tunnel syndrome, bilateral upper limbs: Secondary | ICD-10-CM | POA: Diagnosis not present

## 2020-11-22 DIAGNOSIS — G5623 Lesion of ulnar nerve, bilateral upper limbs: Secondary | ICD-10-CM | POA: Diagnosis not present

## 2020-11-22 DIAGNOSIS — M797 Fibromyalgia: Secondary | ICD-10-CM | POA: Diagnosis not present

## 2020-11-22 DIAGNOSIS — M5412 Radiculopathy, cervical region: Secondary | ICD-10-CM | POA: Diagnosis not present

## 2020-11-22 DIAGNOSIS — G250 Essential tremor: Secondary | ICD-10-CM | POA: Diagnosis not present

## 2020-11-22 DIAGNOSIS — M5417 Radiculopathy, lumbosacral region: Secondary | ICD-10-CM | POA: Diagnosis not present

## 2020-12-18 DIAGNOSIS — H02839 Dermatochalasis of unspecified eye, unspecified eyelid: Secondary | ICD-10-CM | POA: Diagnosis not present

## 2020-12-18 DIAGNOSIS — H04129 Dry eye syndrome of unspecified lacrimal gland: Secondary | ICD-10-CM | POA: Diagnosis not present

## 2020-12-18 DIAGNOSIS — H26493 Other secondary cataract, bilateral: Secondary | ICD-10-CM | POA: Diagnosis not present

## 2020-12-18 DIAGNOSIS — Z0100 Encounter for examination of eyes and vision without abnormal findings: Secondary | ICD-10-CM | POA: Diagnosis not present

## 2020-12-18 DIAGNOSIS — H02889 Meibomian gland dysfunction of unspecified eye, unspecified eyelid: Secondary | ICD-10-CM | POA: Diagnosis not present

## 2021-01-17 ENCOUNTER — Ambulatory Visit: Payer: Self-pay | Admitting: *Deleted

## 2021-01-17 ENCOUNTER — Encounter: Payer: Self-pay | Admitting: Unknown Physician Specialty

## 2021-01-17 NOTE — Telephone Encounter (Signed)
Patient calls with several complaints today.  1-left-sided neck pain with turning to the left intermittently pain #3 for about one week after doing gardening. Heat helps, using her percocet helps and she has been trying not to move around a lot these last few days.  2-upper mid abdominal pain-about one week and only when she palpates an inch above umbilicus pain#3 she has had to have liver test in the past and never followed up.  Last labs 01/30/21. 3-indigestion last week-doubled Nexium to 2 tabs daily and it resolved. With any severe pain that last go to ED. Scheduled for 01/31/21 with Collene Schlichter, NP Reason for Disposition . Abdominal pain  Answer Assessment - Initial Assessment Questions 1. LOCATION: "Where does it hurt?"      Inch above umbilicus in the center 2. RADIATION: "Does the pain shoot anywhere else?" (e.g., chest, back)     no 3. ONSET: "When did the pain begin?" (e.g., minutes, hours or days ago)      yesterday 4. SUDDEN: "Gradual or sudden onset?"    gradual 5. PATTERN "Does the pain come and go, or is it constant?"    - If constant: "Is it getting better, staying the same, or worsening?"      (Note: Constant means the pain never goes away completely; most serious pain is constant and it progresses)     - If intermittent: "How long does it last?" "Do you have pain now?"     (Note: Intermittent means the pain goes away completely between bouts)    Comes and goes 6. SEVERITY: "How bad is the pain?"  (e.g., Scale 1-10; mild, moderate, or severe)    - MILD (1-3): doesn't interfere with normal activities, abdomen soft and not tender to touch     - MODERATE (4-7): interferes with normal activities or awakens from sleep, abdomen tender to touch     - SEVERE (8-10): excruciating pain, doubled over, unable to do any normal activities       3 7. RECURRENT SYMPTOM: "Have you ever had this type of stomach pain before?" If Yes, ask: "When was the last time?" and "What happened that  time?"      no 8. AGGRAVATING FACTORS: "Does anything seem to cause this pain?" (e.g., foods, stress, alcohol)     Turning to the left 9. CARDIAC SYMPTOMS: "Do you have any of the following symptoms: chest pain, difficulty breathing, sweating, nausea?"     Back pain but is not new 10. OTHER SYMPTOMS: "Do you have any other symptoms?" (e.g., back pain, diarrhea, fever, urination pain, vomiting)       Back pain 11. PREGNANCY: "Is there any chance you are pregnant?" "When was your last menstrual period?"       na  Protocols used: ABDOMINAL PAIN - UPPER-A-AH

## 2021-01-31 ENCOUNTER — Ambulatory Visit (INDEPENDENT_AMBULATORY_CARE_PROVIDER_SITE_OTHER): Payer: Medicare HMO | Admitting: Unknown Physician Specialty

## 2021-01-31 ENCOUNTER — Other Ambulatory Visit: Payer: Self-pay

## 2021-01-31 ENCOUNTER — Encounter: Payer: Self-pay | Admitting: Unknown Physician Specialty

## 2021-01-31 DIAGNOSIS — E669 Obesity, unspecified: Secondary | ICD-10-CM | POA: Diagnosis not present

## 2021-01-31 DIAGNOSIS — F3341 Major depressive disorder, recurrent, in partial remission: Secondary | ICD-10-CM | POA: Diagnosis not present

## 2021-01-31 DIAGNOSIS — R739 Hyperglycemia, unspecified: Secondary | ICD-10-CM | POA: Diagnosis not present

## 2021-01-31 DIAGNOSIS — E7800 Pure hypercholesterolemia, unspecified: Secondary | ICD-10-CM

## 2021-01-31 DIAGNOSIS — R69 Illness, unspecified: Secondary | ICD-10-CM | POA: Diagnosis not present

## 2021-01-31 DIAGNOSIS — I10 Essential (primary) hypertension: Secondary | ICD-10-CM | POA: Diagnosis not present

## 2021-01-31 NOTE — Patient Instructions (Signed)
Weight loss: [employer]

## 2021-01-31 NOTE — Assessment & Plan Note (Signed)
Stable, continue present medications.   

## 2021-01-31 NOTE — Assessment & Plan Note (Signed)
Check labs tomorrow when fasting.

## 2021-01-31 NOTE — Assessment & Plan Note (Signed)
Discussed weight loss options and phentermine not an option.  Will refer to bariatric clinic

## 2021-01-31 NOTE — Progress Notes (Signed)
BP 126/72   Pulse 75   Temp 98.2 F (36.8 C) (Oral)   Resp 16   Ht 5\' 8"  (1.727 m)   Wt 227 lb 12.8 oz (103.3 kg)   SpO2 96%   BMI 34.64 kg/m    Subjective:    Patient ID: Robin Bryan, female    DOB: Jan 14, 1951, 70 y.o.   MRN: [id number]  HPI: Robin Bryan is a 70 y.o. female  Chief Complaint  Patient presents with  . Hypertension  . Hyperlipidemia  . Depression    Follow up    Hypertension Using medications without difficulty Average home BPs   No problems or lightheadedness No chest pain with exertion or shortness of breath No Edema   Hyperlipidemia Using medications without problems: No Muscle aches  Diet compliance: Exercise:  Mini strokes Taking Plavix.  Without incident for the past 14 years since on Plavix  Obesity Pt feels she needs something to control her hunger   Depression screen [facility] 2/9 01/31/2021 07/31/2020 05/22/2020 01/24/2020 10/25/2019  Decreased Interest 0 0 0 0 0  Down, Depressed, Hopeless 0 0 0 0 0  PHQ - 2 Score 0 0 0 0 0  Altered sleeping 0 - - 0 0  Tired, decreased energy 3 - - 0 0  Change in appetite 3 - - 0 0  Feeling bad or failure about yourself  0 - - 0 0  Trouble concentrating 0 - - 0 0  Moving slowly or fidgety/restless 0 - - 0 0  Suicidal thoughts 0 - - 0 0  PHQ-9 Score 6 - - 0 0  Difficult doing work/chores Somewhat difficult - - Not difficult at all Not difficult at all  Some recent data might be hidden     Relevant past medical, surgical, family and social history reviewed and updated as indicated. Interim medical history since our last visit reviewed. Allergies and medications reviewed and updated.  Review of Systems  All other systems reviewed and are negative.   Per HPI unless specifically indicated above     Objective:    BP 126/72   Pulse 75   Temp 98.2 F (36.8 C) (Oral)   Resp 16   Ht 5\' 8"  (1.727 m)   Wt 227 lb 12.8 oz (103.3 kg)   SpO2 96%   BMI 34.64 kg/m   Wt Readings from Last  3 Encounters:  01/31/21 227 lb 12.8 oz (103.3 kg)  07/31/20 205 lb 11.2 oz (93.3 kg)  05/22/20 211 lb (95.7 kg)    Physical Exam Constitutional:      General: She is not in acute distress.    Appearance: Normal appearance. She is well-developed.  HENT:     Head: Normocephalic and atraumatic.  Eyes:     General: Lids are normal. No scleral icterus.       Right eye: No discharge.        Left eye: No discharge.     Conjunctiva/sclera: Conjunctivae normal.  Neck:     Vascular: No carotid bruit or JVD.  Cardiovascular:     Rate and Rhythm: Normal rate and regular rhythm.     Heart sounds: Normal heart sounds.  Pulmonary:     Effort: Pulmonary effort is normal.     Breath sounds: Normal breath sounds.  Abdominal:     Palpations: There is no hepatomegaly or splenomegaly.  Musculoskeletal:        General: Normal range of motion.     Cervical  back: Normal range of motion and neck supple.  Skin:    General: Skin is warm and dry.     Coloration: Skin is not pale.     Findings: No rash.  Neurological:     Mental Status: She is alert and oriented to person, place, and time.  Psychiatric:        Behavior: Behavior normal.        Thought Content: Thought content normal.        Judgment: Judgment normal.     Results for orders placed or performed in visit on 01/31/20  Lipid panel  Result Value Ref Range   Cholesterol 109 <200 mg/dL   HDL 37 (L) > OR = 50 mg/dL   Triglycerides 103 <150 mg/dL   LDL Cholesterol (Calc) 53 mg/dL (calc)   Total CHOL/HDL Ratio 2.9 <5.0 (calc)   Non-HDL Cholesterol (Calc) 72 <130 mg/dL (calc)  COMPLETE METABOLIC PANEL WITH GFR  Result Value Ref Range   Glucose, Bld 113 (H) 65 - 99 mg/dL   BUN 14 7 - 25 mg/dL   Creat 0.70 0.50 - 0.99 mg/dL   GFR, Est Non African American 90 > OR = 60 mL/min/1.73m2   GFR, Est African American 104 > OR = 60 mL/min/1.37m2   BUN/Creatinine Ratio NOT APPLICABLE 6 - 22 (calc)   Sodium 144 135 - 146 mmol/L   Potassium 4.9  3.5 - 5.3 mmol/L   Chloride 107 98 - 110 mmol/L   CO2 31 20 - 32 mmol/L   Calcium 9.4 8.6 - 10.4 mg/dL   Total Protein 6.3 6.1 - 8.1 g/dL   Albumin 4.2 3.6 - 5.1 g/dL   Globulin 2.1 1.9 - 3.7 g/dL (calc)   AG Ratio 2.0 1.0 - 2.5 (calc)   Total Bilirubin 0.5 0.2 - 1.2 mg/dL   Alkaline phosphatase (APISO) 57 37 - 153 U/L   AST 15 10 - 35 U/L   ALT 17 6 - 29 U/L  Hemoglobin A1c  Result Value Ref Range   Hgb A1c MFr Bld 5.3 <5.7 % of total Hgb   Mean Plasma Glucose 105 (calc)   eAG (mmol/L) 5.8 (calc)  TSH  Result Value Ref Range   TSH 1.48 0.40 - 4.50 mIU/L  CBC with Differential/Platelet  Result Value Ref Range   WBC 6.3 3.8 - 10.8 Thousand/uL   RBC 4.35 3.80 - 5.10 Million/uL   Hemoglobin 13.8 11.7 - 15.5 g/dL   HCT 40.4 35.0 - 45.0 %   MCV 92.9 80.0 - 100.0 fL   MCH 31.7 27.0 - 33.0 pg   MCHC 34.2 32.0 - 36.0 g/dL   RDW 13.1 11.0 - 15.0 %   Platelets 233 140 - 400 Thousand/uL   MPV 8.7 7.5 - 12.5 fL   Neutro Abs 3,623 1,500 - 7,800 cells/uL   Lymphs Abs 1,966 850 - 3,900 cells/uL   Absolute Monocytes 410 200 - 950 cells/uL   Eosinophils Absolute 233 15 - 500 cells/uL   Basophils Absolute 69 0 - 200 cells/uL   Neutrophils Relative % 57.5 %   Total Lymphocyte 31.2 %   Monocytes Relative 6.5 %   Eosinophils Relative 3.7 %   Basophils Relative 1.1 %      Assessment & Plan:   Problem List Items Addressed This Visit      Unprioritized   Benign essential HTN    Stable, continue present medications.        Major depression in partial remission ([place])  Stable, continue present medications.        Relevant Orders   Comprehensive metabolic panel   Obesity (BMI 30.0-34.9)    Discussed weight loss options and phentermine not an option.  Will refer to bariatric clinic      Relevant Orders   TSH   CBC with Differential/Platelet   Comprehensive metabolic panel   Hemoglobin A1c   Lipid panel   Pure hypercholesterolemia    Check labs tomorrow when fasting.             Follow up plan: Return in about 3 months (around 05/03/2021).

## 2021-02-01 LAB — COMPREHENSIVE METABOLIC PANEL
AG Ratio: 2.1 (calc) (ref 1.0–2.5)
ALT: 15 U/L (ref 6–29)
AST: 12 U/L (ref 10–35)
Albumin: 4.2 g/dL (ref 3.6–5.1)
Alkaline phosphatase (APISO): 62 U/L (ref 37–153)
BUN: 19 mg/dL (ref 7–25)
CO2: 29 mmol/L (ref 20–32)
Calcium: 9.1 mg/dL (ref 8.6–10.4)
Chloride: 107 mmol/L (ref 98–110)
Creat: 0.8 mg/dL (ref 0.50–0.99)
Globulin: 2 g/dL (calc) (ref 1.9–3.7)
Glucose, Bld: 116 mg/dL — ABNORMAL HIGH (ref 65–99)
Potassium: 4.5 mmol/L (ref 3.5–5.3)
Sodium: 143 mmol/L (ref 135–146)
Total Bilirubin: 0.4 mg/dL (ref 0.2–1.2)
Total Protein: 6.2 g/dL (ref 6.1–8.1)

## 2021-02-01 LAB — CBC WITH DIFFERENTIAL/PLATELET
Absolute Monocytes: 442 cells/uL (ref 200–950)
Basophils Absolute: 59 cells/uL (ref 0–200)
Basophils Relative: 0.9 %
Eosinophils Absolute: 306 cells/uL (ref 15–500)
Eosinophils Relative: 4.7 %
HCT: 37.8 % (ref 35.0–45.0)
Hemoglobin: 12.8 g/dL (ref 11.7–15.5)
Lymphs Abs: 1794 cells/uL (ref 850–3900)
MCH: 30.8 pg (ref 27.0–33.0)
MCHC: 33.9 g/dL (ref 32.0–36.0)
MCV: 91.1 fL (ref 80.0–100.0)
MPV: 8.8 fL (ref 7.5–12.5)
Monocytes Relative: 6.8 %
Neutro Abs: 3900 cells/uL (ref 1500–7800)
Neutrophils Relative %: 60 %
Platelets: 256 10*3/uL (ref 140–400)
RBC: 4.15 10*6/uL (ref 3.80–5.10)
RDW: 12.9 % (ref 11.0–15.0)
Total Lymphocyte: 27.6 %
WBC: 6.5 10*3/uL (ref 3.8–10.8)

## 2021-02-01 LAB — TSH: TSH: 1.57 mIU/L (ref 0.40–4.50)

## 2021-02-01 LAB — LIPID PANEL
Cholesterol: 113 mg/dL (ref <200)
HDL: 31 mg/dL — ABNORMAL LOW (ref >=50)
LDL Cholesterol (Calc): 49 mg/dL (calc)
Non-HDL Cholesterol (Calc): 82 mg/dL (calc) (ref <130)
Total CHOL/HDL Ratio: 3.6 (calc) (ref <5.0)
Triglycerides: 314 mg/dL — ABNORMAL HIGH (ref <150)

## 2021-02-01 LAB — HEMOGLOBIN A1C
Hgb A1c MFr Bld: 5.6 % of total Hgb (ref <5.7)
Mean Plasma Glucose: 114 mg/dL
eAG (mmol/L): 6.3 mmol/L

## 2021-02-14 DIAGNOSIS — M5442 Lumbago with sciatica, left side: Secondary | ICD-10-CM | POA: Diagnosis not present

## 2021-02-14 DIAGNOSIS — M5417 Radiculopathy, lumbosacral region: Secondary | ICD-10-CM | POA: Diagnosis not present

## 2021-02-14 DIAGNOSIS — M25561 Pain in right knee: Secondary | ICD-10-CM | POA: Diagnosis not present

## 2021-02-14 DIAGNOSIS — M542 Cervicalgia: Secondary | ICD-10-CM | POA: Diagnosis not present

## 2021-03-18 DIAGNOSIS — N3281 Overactive bladder: Secondary | ICD-10-CM | POA: Diagnosis not present

## 2021-04-19 DIAGNOSIS — H26493 Other secondary cataract, bilateral: Secondary | ICD-10-CM | POA: Diagnosis not present

## 2021-04-19 DIAGNOSIS — H02889 Meibomian gland dysfunction of unspecified eye, unspecified eyelid: Secondary | ICD-10-CM | POA: Diagnosis not present

## 2021-04-19 DIAGNOSIS — H04129 Dry eye syndrome of unspecified lacrimal gland: Secondary | ICD-10-CM | POA: Diagnosis not present

## 2021-04-19 DIAGNOSIS — H02839 Dermatochalasis of unspecified eye, unspecified eyelid: Secondary | ICD-10-CM | POA: Diagnosis not present

## 2021-04-26 ENCOUNTER — Other Ambulatory Visit: Payer: Self-pay | Admitting: Family Medicine

## 2021-04-26 DIAGNOSIS — F3341 Major depressive disorder, recurrent, in partial remission: Secondary | ICD-10-CM

## 2021-04-26 NOTE — Telephone Encounter (Signed)
Last seen 6.2.2022

## 2021-04-26 NOTE — Telephone Encounter (Signed)
Medication Refill - Medication: Escitalopram  Has the patient contacted their pharmacy? No. Pt states that she needed to change her pharmacy and that's why she is calling in. Please advise . (Agent: If no, request that the patient contact the pharmacy for the refill.) (Agent: If yes, when and what did the pharmacy advise?)  Preferred Pharmacy (with phone number or street name):  [facility] [place], [place] - [address]  [address] [place] [address]  Phone: [phone] Fax: [phone]  Hours: Not open 24 hours     Agent: Please be advised that RX refills may take up to 3 business days. We ask that you follow-up with your pharmacy.

## 2021-04-26 NOTE — Telephone Encounter (Signed)
Requested medication (s) are due for refill today: Yes  Requested medication (s) are on the active medication list: Yes  Last refill:  04/06/20  Future visit scheduled: No  Notes to clinic:  Prescription expired.    Requested Prescriptions  Pending Prescriptions Disp Refills   escitalopram (LEXAPRO) 10 MG tablet 90 tablet 3    Sig: Take 1 tablet (10 mg total) by mouth daily.     Psychiatry:  Antidepressants - SSRI Passed - 04/26/2021 12:03 PM      Passed - Completed PHQ-2 or PHQ-9 in the last 360 days      Passed - Valid encounter within last 6 months    Recent Outpatient Visits           2 months ago Obesity (BMI 30.0-34.9)   [facility] [place], [place], NP   8 months ago Seasonal allergic rhinitis due to pollen   [place], MD   1 year ago Seasonal allergic rhinitis due to pollen   [facility] Tow[place]alkin, MD   1 year ago Benign essential HTN   [facility] [place] Malkin, MD   1 year ago Respiratory illness   [place], FNP       Future Appointments             In 3 weeks [facility], [facility]

## 2021-05-02 MED ORDER — ESCITALOPRAM OXALATE 10 MG PO TABS: 10.0000 mg | ORAL_TABLET | Freq: Every day | ORAL | 3 refills | Status: DC

## 2021-05-03 ENCOUNTER — Ambulatory Visit: Payer: Self-pay

## 2021-05-03 ENCOUNTER — Encounter: Payer: Self-pay | Admitting: Nurse Practitioner

## 2021-05-03 ENCOUNTER — Telehealth (INDEPENDENT_AMBULATORY_CARE_PROVIDER_SITE_OTHER): Payer: Medicare HMO | Admitting: Nurse Practitioner

## 2021-05-03 VITALS — HR 77 | Ht 68.0 in | Wt 227.0 lb

## 2021-05-03 DIAGNOSIS — R059 Cough, unspecified: Secondary | ICD-10-CM

## 2021-05-03 DIAGNOSIS — R0602 Shortness of breath: Secondary | ICD-10-CM

## 2021-05-03 DIAGNOSIS — J4531 Mild persistent asthma with (acute) exacerbation: Secondary | ICD-10-CM

## 2021-05-03 DIAGNOSIS — R062 Wheezing: Secondary | ICD-10-CM | POA: Diagnosis not present

## 2021-05-03 MED ORDER — ALBUTEROL SULFATE HFA 108 (90 BASE) MCG/ACT IN AERS: 2.0000 | INHALATION_SPRAY | Freq: Four times a day (QID) | RESPIRATORY_TRACT | 3 refills | Status: DC | PRN

## 2021-05-03 MED ORDER — BENZONATATE 100 MG PO CAPS: 100.0000 mg | ORAL_CAPSULE | Freq: Two times a day (BID) | ORAL | 0 refills | Status: AC | PRN

## 2021-05-03 MED ORDER — ADVAIR HFA 45-21 MCG/ACT IN AERO: 2.0000 | INHALATION_SPRAY | Freq: Two times a day (BID) | RESPIRATORY_TRACT | 12 refills | Status: DC

## 2021-05-03 NOTE — Telephone Encounter (Signed)
Reason for Disposition  [1] MILD difficulty breathing (e.g., minimal/no SOB at rest, SOB with walking, pulse <100) AND [2] still present when not coughing  Answer Assessment - Initial Assessment Questions 1. ONSET: "When did the cough begin?"      Last night 2. SEVERITY: "How bad is the cough today?"      Severe 3. SPUTUM: "Describe the color of your sputum" (none, dry cough; clear, white, yellow, green)     none 4. HEMOPTYSIS: "Are you coughing up any blood?" If so ask: "How much?" (flecks, streaks, tablespoons, etc.)     none 5. DIFFICULTY BREATHING: "Are you having difficulty breathing?" If Yes, ask: "How bad is it?" (e.g., mild, moderate, severe)    - MILD: No SOB at rest, mild SOB with walking, speaks normally in sentences, can lie down, no retractions, pulse < 100.    - MODERATE: SOB at rest, SOB with minimal exertion and prefers to sit, cannot lie down flat, speaks in phrases, mild retractions, audible wheezing, pulse 100-120.    - SEVERE: Very SOB at rest, speaks in single words, struggling to breathe, sitting hunched forward, retractions, pulse > 120      Mild 6. FEVER: "Do you have a fever?" If Yes, ask: "What is your temperature, how was it measured, and when did it start?"     No 7. CARDIAC HISTORY: "Do you have any history of heart disease?" (e.g., heart attack, congestive heart failure)      Irregular heart beat 8. LUNG HISTORY: "Do you have any history of lung disease?"  (e.g., pulmonary embolus, asthma, emphysema)     Asthma 9. PE RISK FACTORS: "Do you have a history of blood clots?" (or: recent major surgery, recent prolonged travel, bedridden)     No 10. OTHER SYMPTOMS: "Do you have any other symptoms?" (e.g., runny nose, wheezing, chest pain)       Wheezing, sore throat 11. PREGNANCY: "Is there any chance you are pregnant?" "When was your last menstrual period?"       No 12. TRAVEL: "Have you traveled out of the country in the last month?" (e.g., travel history,  exposures)       No  Protocols used: Cough - Acute Non-Productive-A-AH

## 2021-05-03 NOTE — Telephone Encounter (Signed)
Pt. Reports she started coughing yesterday. Non-productive. "Coughed all night." Has wheezing. No fever. "I'm afraid I might have bronchitis." Needs refill on inhaler. Virtual appointment made per Cassandra in the practice.

## 2021-05-03 NOTE — Progress Notes (Signed)
Name: Robin Bryan   MRN: [id number]    DOB: 1951/05/10   Date:05/03/2021       Progress Note  Subjective  Chief Complaint  Chief Complaint  Patient presents with   Cough    Wheezing, sob and sore throat    I connected with  Dalbert Mayotte Deltoro  on 05/03/21 at  2:00 PM EDT by a video enabled telemedicine application and verified that I am speaking with the correct person using two identifiers.  I discussed the limitations of evaluation and management by telemedicine and the availability of in person appointments. The patient expressed understanding and agreed to proceed with a virtual visit  Staff also discussed with the patient that there may be a patient responsible charge related to this service. Patient Location: home Provider Location: [facility] Additional Individuals present: alone  HPI  Cough/ wheezing/ shortness of breath/sore throat:  Symptoms started last night.  She reports that she was coughing all night. She denies coughing any thing up.  She denies fever and chest pain.  She reports shortness of breath with exertion. She says she can hear the wheezing.  She is also complaining of sore throat she used a OTC throat lozenge, which helped.     Asthma: she has been out of her albuterol for about 2-3 months.  She says she is wheezing.  She says she has been wheezing all night.  She has a pulse ox but she said it was not working.  Advised her to get one that works so she could monitor her oxygen saturation. Patient Active Problem List   Diagnosis Date Noted   Seasonal allergic rhinitis due to pollen 01/24/2020   Prediabetes 01/24/2020   Mild intermittent asthma without complication [id number]   Cataract of both eyes 10/25/2019   Liver hemangioma 09/08/2018   Fatty liver 09/08/2018   Overactive bladder 03/19/2018   Obesity (BMI 30.0-34.9) 02/25/2018   Estrogen deficiency 01/08/2018   Postmenopausal 01/07/2018   Back pain 11/26/2017   Chronic kidney disease, stage III  (moderate) (HCC) 11/26/2017   Calcaneal spur of both feet 11/26/2017   Arthritis of both feet 11/26/2017   Acute left-sided thoracic back pain 10/30/2015   Temporary cerebral vascular dysfunction 07/16/2015   Adaptation reaction 07/16/2015   Foot pain 07/16/2015   Acid reflux 07/16/2015   Abnormal LFTs 07/16/2015   Pericardial effusion 07/13/2015   Mass of chest wall, left 06/29/2015   Lipoma 06/29/2015   Pure hypercholesterolemia 04/28/2015   Gastro-esophageal reflux disease without esophagitis 04/28/2015   Benign essential HTN 04/28/2015   Hyperlipidemia 03/19/2015   Elevated hemoglobin A1c 03/19/2015   Neck muscle spasm 03/19/2015   Cognitive change 02/07/2015   Persistent dry cough 02/07/2015   Major depression in partial remission ([place]) 02/07/2015   Fibrositis 02/07/2015   Anxiety disorder 02/07/2015   History of anaphylaxis 02/07/2015   Pubic bone pain 10/02/2014   Mass of pelvis 06/05/2014   Ovarian cyst, left 05/26/2014   Abdominal pain, chronic, left lower quadrant 05/26/2014   CEREBROVASCULAR DISEASE 04/23/2010   COLONIC POLYPS, ADENOMATOUS 12/09/2006   ESOPHAGEAL STRICTURE 12/09/2006   GERD 12/09/2006   BARRETTS ESOPHAGUS 12/09/2006   DIVERTICULOSIS, COLON 12/09/2006    Social History   Tobacco Use   Smoking status: Former    Packs/day: 0.50    Years: 8.00    Pack years: 4.00    Types: Cigarettes    Quit date: 05/27/2011    Years since quitting: 9.9   Smokeless tobacco: Never  Tobacco comments:    smoking cessation materials not required  Substance Use Topics   Alcohol use: No     Current Outpatient Medications:    albuterol (VENTOLIN HFA) 108 (90 Base) MCG/ACT inhaler, Inhale 2 puffs into the lungs every 6 (six) hours as needed for wheezing or shortness of breath., Disp: 6.7 g, Rfl: 3   APPLE CIDER VINEGAR PO, Take by mouth. [place] gummies, Disp: , Rfl:    clopidogrel (PLAVIX) 75 MG tablet, Take 1 tablet (75 mg total) by mouth daily., Disp: 90  tablet, Rfl: 1   diphenhydrAMINE (BENADRYL) 25 mg capsule, Take 1 capsule (25 mg total) by mouth every 8 (eight) hours as needed., Disp: 30 capsule, Rfl: 0   escitalopram (LEXAPRO) 10 MG tablet, Take 1 tablet (10 mg total) by mouth daily., Disp: 90 tablet, Rfl: 3   fluticasone (FLONASE) 50 MCG/ACT nasal spray, SPRAY 2 SPRAYS INTO EACH NOSTRIL EVERY DAY, Disp: 48 mL, Rfl: 1   gabapentin (NEURONTIN) 400 MG capsule, Take 800 mg by mouth 4 (four) times daily. , Disp: , Rfl:    hydrochlorothiazide (HYDRODIURIL) 12.5 MG tablet, Take 1 tablet (12.5 mg total) by mouth daily., Disp: 90 tablet, Rfl: 1   loratadine (CLARITIN) 10 MG tablet, Take 1 tablet (10 mg total) by mouth daily., Disp: 90 tablet, Rfl: 3   MYRBETRIQ 50 MG TB24 tablet, TK 1 T PO QD, Disp: , Rfl: 3   oxyCODONE-acetaminophen (PERCOCET/ROXICET) 5-325 MG tablet, Take 1 tablet by mouth every 6 (six) hours as needed. for pain, Disp: , Rfl:    pantoprazole (PROTONIX) 40 MG tablet, Take one tablet by mouth twice a day;, Disp: 180 tablet, Rfl: 3   primidone (MYSOLINE) 50 MG tablet, Take 1 tablet by mouth daily., Disp: , Rfl: 6   propranolol ER (INDERAL LA) 60 MG 24 hr capsule, Take 60 mg by mouth daily. , Disp: , Rfl:    rosuvastatin (CRESTOR) 10 MG tablet, Take 1 tablet (10 mg total) by mouth daily., Disp: 90 tablet, Rfl: 3   tiZANidine (ZANAFLEX) 4 MG tablet, Take 4-8 mg by mouth at bedtime., Disp: , Rfl:    valsartan (DIOVAN) 80 MG tablet, Take 1 tablet (80 mg total) by mouth daily., Disp: 90 tablet, Rfl: 3   EPINEPHrine 0.3 mg/0.3 mL IJ SOAJ injection, Inject 0.3 mLs (0.3 mg total) into the muscle once for 1 dose., Disp: 0.3 mL, Rfl: 0  Allergies  Allergen Reactions   Shellfish Allergy Swelling   Vicodin [Hydrocodone-Acetaminophen] Nausea Only    I personally reviewed active problem list, medication list, allergies with the patient/caregiver today.  ROS  Constitutional: Negative for fever or weight change.  Respiratory: Positive for  cough, wheezing and shortness of breath.   Cardiovascular: Negative for chest pain or palpitations.  Gastrointestinal: Negative for abdominal pain, no bowel changes.  Musculoskeletal: Negative for gait problem or joint swelling.  Skin: Negative for rash.  Neurological: Negative for dizziness or headache.  No other specific complaints in a complete review of systems (except as listed in HPI above).   Objective  Virtual encounter, vitals not obtained.  Body mass index is 34.52 kg/m.  Nursing Note and Vital Signs reviewed.  Physical Exam  Awake, alert and oriented  No results found for this or any previous visit (from the past 55 hour(s)).  Assessment & Plan  1. Shortness of breath - She is going to come by for a covid test.  Discussed with patient to treatment plan if positive.  She  would like to take Paxlovid if positive for covid.  Discussed concerning signs that would warrant going to the emergency department.  Discussed use of inhalers.  Pushing fluids, getting rest and quarantine recommendations were also discussed.  She is agreeable to plan.    - fluticasone-salmeterol (ADVAIR HFA) 45-21 MCG/ACT inhaler; Inhale 2 puffs into the lungs 2 (two) times daily.  Dispense: 1 each; Refill: 12 - albuterol (VENTOLIN HFA) 108 (90 Base) MCG/ACT inhaler; Inhale 2 puffs into the lungs every 6 (six) hours as needed for wheezing or shortness of breath.  Dispense: 6.7 g; Refill: 3 - benzonatate (TESSALON) 100 MG capsule; Take 1 capsule (100 mg total) by mouth 2 (two) times daily as needed for up to 10 days for cough.  Dispense: 20 capsule; Refill: 0 - Novel Coronavirus, NAA ([facility])  2. Wheezing  - fluticasone-salmeterol (ADVAIR HFA) 45-21 MCG/ACT inhaler; Inhale 2 puffs into the lungs 2 (two) times daily.  Dispense: 1 each; Refill: 12 - albuterol (VENTOLIN HFA) 108 (90 Base) MCG/ACT inhaler; Inhale 2 puffs into the lungs every 6 (six) hours as needed for wheezing or shortness of breath.   Dispense: 6.7 g; Refill: 3 - benzonatate (TESSALON) 100 MG capsule; Take 1 capsule (100 mg total) by mouth 2 (two) times daily as needed for up to 10 days for cough.  Dispense: 20 capsule; Refill: 0 - Novel Coronavirus, NAA ([facility])  3. Cough  - fluticasone-salmeterol (ADVAIR HFA) 45-21 MCG/ACT inhaler; Inhale 2 puffs into the lungs 2 (two) times daily.  Dispense: 1 each; Refill: 12 - albuterol (VENTOLIN HFA) 108 (90 Base) MCG/ACT inhaler; Inhale 2 puffs into the lungs every 6 (six) hours as needed for wheezing or shortness of breath.  Dispense: 6.7 g; Refill: 3 - benzonatate (TESSALON) 100 MG capsule; Take 1 capsule (100 mg total) by mouth 2 (two) times daily as needed for up to 10 days for cough.  Dispense: 20 capsule; Refill: 0 - Novel Coronavirus, NAA ([facility])  4. Mild persistent asthma with acute exacerbation  - fluticasone-salmeterol (ADVAIR HFA) 45-21 MCG/ACT inhaler; Inhale 2 puffs into the lungs 2 (two) times daily.  Dispense: 1 each; Refill: 12 - albuterol (VENTOLIN HFA) 108 (90 Base) MCG/ACT inhaler; Inhale 2 puffs into the lungs every 6 (six) hours as needed for wheezing or shortness of breath.  Dispense: 6.7 g; Refill: 3 - benzonatate (TESSALON) 100 MG capsule; Take 1 capsule (100 mg total) by mouth 2 (two) times daily as needed for up to 10 days for cough.  Dispense: 20 capsule; Refill: 0 - Novel Coronavirus, NAA ([facility])   -Red flags and when to present for emergency care or RTC including fever >101.7F, chest pain, shortness of breath, new/worsening/un-resolving symptoms, reviewed with patient at time of visit. Follow up and care instructions discussed and provided in AVS. - I discussed the assessment and treatment plan with the patient. The patient was provided an opportunity to ask questions and all were answered. The patient agreed with the plan and demonstrated an understanding of the instructions.  I provided 25 minutes of non-face-to-face time during this  encounter.  Bo Merino, FNP

## 2021-05-04 ENCOUNTER — Other Ambulatory Visit: Payer: Self-pay | Admitting: Nurse Practitioner

## 2021-05-04 DIAGNOSIS — R059 Cough, unspecified: Secondary | ICD-10-CM

## 2021-05-04 LAB — SARS-COV-2, NAA 2 DAY TAT

## 2021-05-04 LAB — NOVEL CORONAVIRUS, NAA: SARS-CoV-2, NAA: NOT DETECTED

## 2021-05-04 MED ORDER — GUAIFENESIN-DM 100-10 MG/5ML PO SYRP: 10.0000 mL | ORAL_SOLUTION | Freq: Four times a day (QID) | ORAL | 0 refills | Status: DC | PRN

## 2021-05-04 NOTE — Progress Notes (Signed)
Spoke to patient on the phone regarding negative covid results.  She said that the tessalon pearls were not helping with her cough.  She has been using the inhalers and they are helping.  She requested a liquid cough medication.  Discussed any concerning signs and symptoms and she verbalized understanding.

## 2021-05-09 ENCOUNTER — Other Ambulatory Visit: Payer: Self-pay | Admitting: Unknown Physician Specialty

## 2021-05-09 DIAGNOSIS — F3341 Major depressive disorder, recurrent, in partial remission: Secondary | ICD-10-CM

## 2021-05-09 NOTE — Telephone Encounter (Signed)
Copied from [place] [id number]. Topic: Quick Communication - Rx Refill/Question >> May 09, 2021 10:33 AM Leward Quan A wrote: Medication: escitalopram (LEXAPRO) 10 MG tablet  Need Rx sent to local pharmacy today please for pick up Has the patient contacted their pharmacy? Yes.   (Agent: If no, request that the patient contact the pharmacy for the refill.) (Agent: If yes, when and what did the pharmacy advise?)  Preferred Pharmacy (with phone number or street name): [facility] [id number], [place]  Phone:  [phone] Fax:  [phone]     Agent: Please be advised that RX refills may take up to 3 business days. We ask that you follow-up with your pharmacy.

## 2021-05-10 MED ORDER — ESCITALOPRAM OXALATE 10 MG PO TABS: 10.0000 mg | ORAL_TABLET | Freq: Every day | ORAL | 3 refills | Status: DC

## 2021-05-16 ENCOUNTER — Other Ambulatory Visit: Payer: Self-pay | Admitting: Family Medicine

## 2021-05-16 NOTE — Telephone Encounter (Signed)
Requested medications are due for refill today.  yes  Requested medications are on the active medications list.  yes  Last refill. 11/19/2020  Future visit scheduled.   yes  Notes to clinic.  PCP listed Dr. Roxan Hockey.

## 2021-05-16 NOTE — Telephone Encounter (Signed)
Last seen 9.2.2022

## 2021-05-17 ENCOUNTER — Other Ambulatory Visit: Payer: Self-pay

## 2021-05-22 ENCOUNTER — Other Ambulatory Visit: Payer: Self-pay

## 2021-05-22 MED ORDER — CLOPIDOGREL BISULFATE 75 MG PO TABS
75.0000 mg | ORAL_TABLET | Freq: Every day | ORAL | 1 refills | Status: DC
Start: 2021-05-22 — End: 2021-10-18

## 2021-05-23 ENCOUNTER — Ambulatory Visit: Payer: Medicare HMO

## 2021-05-23 DIAGNOSIS — M5442 Lumbago with sciatica, left side: Secondary | ICD-10-CM | POA: Diagnosis not present

## 2021-05-23 DIAGNOSIS — M5417 Radiculopathy, lumbosacral region: Secondary | ICD-10-CM | POA: Diagnosis not present

## 2021-05-23 DIAGNOSIS — M25561 Pain in right knee: Secondary | ICD-10-CM | POA: Diagnosis not present

## 2021-05-23 DIAGNOSIS — M542 Cervicalgia: Secondary | ICD-10-CM | POA: Diagnosis not present

## 2021-06-17 ENCOUNTER — Telehealth: Payer: Self-pay | Admitting: Internal Medicine

## 2021-06-17 NOTE — Telephone Encounter (Signed)
Copied from [place] [id number]. Topic: Medicare AWV >> Jun 17, 2021 10:09 AM Cher Nakai R wrote: Reason for CRM:  LM with new appt info -rescheduled AWV due to [facility] out of the office

## 2021-06-18 ENCOUNTER — Ambulatory Visit: Payer: Medicare HMO

## 2021-06-24 ENCOUNTER — Other Ambulatory Visit: Payer: Self-pay | Admitting: Specialist

## 2021-06-24 ENCOUNTER — Other Ambulatory Visit: Payer: Self-pay | Admitting: Internal Medicine

## 2021-06-24 DIAGNOSIS — Z1231 Encounter for screening mammogram for malignant neoplasm of breast: Secondary | ICD-10-CM

## 2021-06-26 ENCOUNTER — Other Ambulatory Visit: Payer: Self-pay | Admitting: Internal Medicine

## 2021-06-28 ENCOUNTER — Ambulatory Visit (INDEPENDENT_AMBULATORY_CARE_PROVIDER_SITE_OTHER): Payer: Medicare HMO

## 2021-06-28 ENCOUNTER — Ambulatory Visit: Payer: Medicare HMO | Admitting: Podiatry

## 2021-06-28 ENCOUNTER — Encounter: Payer: Self-pay | Admitting: Podiatry

## 2021-06-28 ENCOUNTER — Other Ambulatory Visit: Payer: Self-pay

## 2021-06-28 VITALS — BP 107/63 | HR 62 | Temp 97.0°F | Resp 16

## 2021-06-28 DIAGNOSIS — M722 Plantar fascial fibromatosis: Secondary | ICD-10-CM | POA: Diagnosis not present

## 2021-06-28 DIAGNOSIS — B353 Tinea pedis: Secondary | ICD-10-CM

## 2021-06-28 MED ORDER — MELOXICAM 15 MG PO TABS: 15.0000 mg | ORAL_TABLET | Freq: Every day | ORAL | 1 refills | Status: DC

## 2021-06-28 MED ORDER — CLOTRIMAZOLE-BETAMETHASONE 1-0.05 % EX CREA: 1.0000 "application " | TOPICAL_CREAM | Freq: Two times a day (BID) | CUTANEOUS | 1 refills | Status: DC

## 2021-06-28 MED ORDER — BETAMETHASONE SOD PHOS & ACET 6 (3-3) MG/ML IJ SUSP
3.0000 mg | Freq: Once | INTRAMUSCULAR | Status: AC
  Administered 2021-06-28: 3 mg via INTRA_ARTICULAR

## 2021-06-28 NOTE — Progress Notes (Signed)
   Subjective: 70 y.o. female presenting today as a reestablish new patient for evaluation of pain and tenderness to the left heel.  This is been ongoing for few months now.  She denies a history of injury.  Worse in the mornings.  Aggravated by long periods of walking and standing  Patient also has developed some itchiness with blister lesions to the right plantar forefoot.  She gets pedicures routinely.  She noticed itching sensation to the bottom of the feet over the past month.   Past Medical History:  Diagnosis Date   Allergy    Anxiety    Arthritis    Arthritis of both feet 11/26/2017   xrays March 2019   Asthma    Barrett's esophagus    Bronchitis    Calcaneal spur of both feet 11/26/2017   Foot xrays March 2019   Depression    Diverticulosis    Esophageal stricture    Fatty liver 09/08/2018   [place] Jan 2020   Fibromyalgia    GERD (gastroesophageal reflux disease)    Hx of adenomatous colonic polyps    Hyperlipidemia    Hypertension    Intramural leiomyoma of uterus    Liver hemangioma 09/08/2018   [place] Jan 2020   Myocardial infarction [facility])    Stroke [facility])      Objective: Physical Exam General: The patient is alert and oriented x3 in no acute distress.  Dermatology: Skin is warm, dry and supple bilateral lower extremities. Negative for open lesions or macerations bilateral.  Pinpoint purulent vesicles noted throughout the right plantar forefoot with pruritus.  Suspicious for acute tinea pedis/athlete's foot  Vascular: Dorsalis Pedis and Posterior Tibial pulses palpable bilateral.  Capillary fill time is immediate to all digits.  Neurological: Epicritic and protective threshold intact bilateral.   Musculoskeletal: Tenderness to palpation to the plantar aspect of the left heel along the plantar fascia. All other joints range of motion within normal limits bilateral. Strength 5/5 in all groups bilateral.   Radiographic exam LT foot 06/28/2021: Normal osseous mineralization.  Joint spaces preserved. No fracture/dislocation/boney destruction. No other soft tissue abnormalities or radiopaque foreign bodies.   Assessment: 1. Plantar fasciitis left foot 2.  Tinea pedis right plantar forefoot  Plan of Care:  1. Patient evaluated. Xrays reviewed.   2. Injection of 0.5cc Celestone soluspan injected into the left plantar fascia.  3.  Prescription for Lotrisone cream to apply to the right forefoot daily 4. Rx for Meloxicam ordered for patient. 5.  Recommend good supportive shoes and sneakers.  Patient is currently wearing some Orthoheel moccasins.  6. Instructed patient regarding therapies and modalities at home to alleviate symptoms.  7. Return to clinic in 4 weeks.     Edrick Kins, DPM [facility]  Dr. Edrick Kins, DPM    [address], [place] [address]                Office [phone]  Fax [phone]

## 2021-07-05 ENCOUNTER — Other Ambulatory Visit: Payer: Self-pay | Admitting: Internal Medicine

## 2021-07-10 DIAGNOSIS — L814 Other melanin hyperpigmentation: Secondary | ICD-10-CM | POA: Diagnosis not present

## 2021-07-10 DIAGNOSIS — D225 Melanocytic nevi of trunk: Secondary | ICD-10-CM | POA: Diagnosis not present

## 2021-07-10 DIAGNOSIS — L818 Other specified disorders of pigmentation: Secondary | ICD-10-CM | POA: Diagnosis not present

## 2021-07-10 DIAGNOSIS — Z1283 Encounter for screening for malignant neoplasm of skin: Secondary | ICD-10-CM | POA: Diagnosis not present

## 2021-07-10 DIAGNOSIS — L820 Inflamed seborrheic keratosis: Secondary | ICD-10-CM | POA: Diagnosis not present

## 2021-07-23 ENCOUNTER — Other Ambulatory Visit: Payer: Self-pay | Admitting: Family Medicine

## 2021-07-23 ENCOUNTER — Ambulatory Visit (INDEPENDENT_AMBULATORY_CARE_PROVIDER_SITE_OTHER): Payer: Medicare HMO

## 2021-07-23 DIAGNOSIS — I10 Essential (primary) hypertension: Secondary | ICD-10-CM

## 2021-07-23 DIAGNOSIS — Z0000 Encounter for general adult medical examination without abnormal findings: Secondary | ICD-10-CM | POA: Diagnosis not present

## 2021-07-23 NOTE — Telephone Encounter (Signed)
Requested Prescriptions  Pending Prescriptions Disp Refills  . hydrochlorothiazide (HYDRODIURIL) 12.5 MG tablet [Pharmacy Med Name: HYDROCHLOROTHIAZIDE 12.5 MG TB] 90 tablet 1    Sig: TAKE 1 TABLET BY MOUTH EVERY DAY     Cardiovascular: Diuretics - Thiazide Passed - 07/23/2021  8:01 AM      Passed - Ca in normal range and within 360 days    Calcium  Date Value Ref Range Status  01/31/2021 9.1 8.6 - 10.4 mg/dL Final   Calcium, Total  Date Value Ref Range Status  05/25/2012 8.9 8.5 - 10.1 mg/dL Final         Passed - Cr in normal range and within 360 days    Creat  Date Value Ref Range Status  01/31/2021 0.80 0.50 - 0.99 mg/dL Final    Comment:    For patients >88 years of age, the reference limit for Creatinine is approximately 13% higher for people identified as African-American. .          Passed - K in normal range and within 360 days    Potassium  Date Value Ref Range Status  01/31/2021 4.5 3.5 - 5.3 mmol/L Final  05/25/2012 4.1 3.5 - 5.1 mmol/L Final         Passed - Na in normal range and within 360 days    Sodium  Date Value Ref Range Status  01/31/2021 143 135 - 146 mmol/L Final  04/28/2016 143 134 - 144 mmol/L Final  05/25/2012 140 136 - 145 mmol/L Final         Passed - Last BP in normal range    BP Readings from Last 1 Encounters:  06/28/21 107/63         Passed - Valid encounter within last 6 months    Recent Outpatient Visits          2 months ago Shortness of breath   [facility] [place], FNP   5 months ago Obesity (BMI 30.0-34.9)   [facility] [place], [place], NP   11 months ago Seasonal allergic rhinitis due to pollen   [place], [place]   1 year ago Seasonal allergic rhinitis due to pollen   [facility] [place], MD   1 year ago Benign essential HTN   [place], MD       Future Appointments            Today [facility], [facility]

## 2021-07-23 NOTE — Patient Instructions (Signed)
Robin Bryan , Thank you for taking time to come for your Medicare Wellness Visit. I appreciate your ongoing commitment to your health goals. Please review the following plan we discussed and let me know if I can assist you in the future.   Screening recommendations/referrals: Colonoscopy: done 03/18/17.repeat 03/2022 Mammogram: done 07/24/20. Scheduled for 07/30/21 Bone Density: done 05/03/19 Recommended yearly ophthalmology/optometry visit for glaucoma screening and checkup Recommended yearly dental visit for hygiene and checkup  Vaccinations: Influenza vaccine: done 06/09/21 Pneumococcal vaccine: done 08/31/18 Tdap vaccine: done 04/03/17 Shingles vaccine: Please provide details of your Shingrix vaccine once you receive the second dose   Covid-19:done 10/27/19, 11/23/19 & 07/23/20  Advanced directives: Advance directive discussed with you today. I have provided a copy for you to complete at home and have notarized. Once this is complete please bring a copy in to our office so we can scan it into your chart.   Conditions/risks identified: Recommend increasing physical activity to at least 3 days per week   Next appointment: Follow up in one year for your annual wellness visit    Preventive Care 65 Years and Older, Female Preventive care refers to lifestyle choices and visits with your health care provider that can promote health and wellness. What does preventive care include? A yearly physical exam. This is also called an annual well check. Dental exams once or twice a year. Routine eye exams. Ask your health care provider how often you should have your eyes checked. Personal lifestyle choices, including: Daily care of your teeth and gums. Regular physical activity. Eating a healthy diet. Avoiding tobacco and drug use. Limiting alcohol use. Practicing safe sex. Taking low-dose aspirin every day. Taking vitamin and mineral supplements as recommended by your health care provider. What  happens during an annual well check? The services and screenings done by your health care provider during your annual well check will depend on your age, overall health, lifestyle risk factors, and family history of disease. Counseling  Your health care provider may ask you questions about your: Alcohol use. Tobacco use. Drug use. Emotional well-being. Home and relationship well-being. Sexual activity. Eating habits. History of falls. Memory and ability to understand (cognition). Work and work Statistician. Reproductive health. Screening  You may have the following tests or measurements: Height, weight, and BMI. Blood pressure. Lipid and cholesterol levels. These may be checked every 5 years, or more frequently if you are over 7 years old. Skin check. Lung cancer screening. You may have this screening every year starting at age 39 if you have a 30-pack-year history of smoking and currently smoke or have quit within the past 15 years. Fecal occult blood test (FOBT) of the stool. You may have this test every year starting at age 20. Flexible sigmoidoscopy or colonoscopy. You may have a sigmoidoscopy every 5 years or a colonoscopy every 10 years starting at age 65. Hepatitis C blood test. Hepatitis B blood test. Sexually transmitted disease (STD) testing. Diabetes screening. This is done by checking your blood sugar (glucose) after you have not eaten for a while (fasting). You may have this done every 1-3 years. Bone density scan. This is done to screen for osteoporosis. You may have this done starting at age 34. Mammogram. This may be done every 1-2 years. Talk to your health care provider about how often you should have regular mammograms. Talk with your health care provider about your test results, treatment options, and if necessary, the need for more tests. Vaccines  Your  health care provider may recommend certain vaccines, such as: Influenza vaccine. This is recommended every  year. Tetanus, diphtheria, and acellular pertussis (Tdap, Td) vaccine. You may need a Td booster every 10 years. Zoster vaccine. You may need this after age 74. Pneumococcal 13-valent conjugate (PCV13) vaccine. One dose is recommended after age 43. Pneumococcal polysaccharide (PPSV23) vaccine. One dose is recommended after age 41. Talk to your health care provider about which screenings and vaccines you need and how often you need them. This information is not intended to replace advice given to you by your health care provider. Make sure you discuss any questions you have with your health care provider. Document Released: 09/14/2015 Document Revised: 05/07/2016 Document Reviewed: 06/19/2015 Elsevier Interactive Patient Education  2017 [place] Prevention in the Home Falls can cause injuries. They can happen to people of all ages. There are many things you can do to make your home safe and to help prevent falls. What can I do on the outside of my home? Regularly fix the edges of walkways and driveways and fix any cracks. Remove anything that might make you trip as you walk through a door, such as a raised step or threshold. Trim any bushes or trees on the path to your home. Use bright outdoor lighting. Clear any walking paths of anything that might make someone trip, such as rocks or tools. Regularly check to see if handrails are loose or broken. Make sure that both sides of any steps have handrails. Any raised decks and porches should have guardrails on the edges. Have any leaves, snow, or ice cleared regularly. Use sand or salt on walking paths during winter. Clean up any spills in your garage right away. This includes oil or grease spills. What can I do in the bathroom? Use night lights. Install grab bars by the toilet and in the tub and shower. Do not use towel bars as grab bars. Use non-skid mats or decals in the tub or shower. If you need to sit down in the shower, use a  plastic, non-slip stool. Keep the floor dry. Clean up any water that spills on the floor as soon as it happens. Remove soap buildup in the tub or shower regularly. Attach bath mats securely with double-sided non-slip rug tape. Do not have throw rugs and other things on the floor that can make you trip. What can I do in the bedroom? Use night lights. Make sure that you have a light by your bed that is easy to reach. Do not use any sheets or blankets that are too big for your bed. They should not hang down onto the floor. Have a firm chair that has side arms. You can use this for support while you get dressed. Do not have throw rugs and other things on the floor that can make you trip. What can I do in the kitchen? Clean up any spills right away. Avoid walking on wet floors. Keep items that you use a lot in easy-to-reach places. If you need to reach something above you, use a strong step stool that has a grab bar. Keep electrical cords out of the way. Do not use floor polish or wax that makes floors slippery. If you must use wax, use non-skid floor wax. Do not have throw rugs and other things on the floor that can make you trip. What can I do with my stairs? Do not leave any items on the stairs. Make sure that there are handrails  on both sides of the stairs and use them. Fix handrails that are broken or loose. Make sure that handrails are as long as the stairways. Check any carpeting to make sure that it is firmly attached to the stairs. Fix any carpet that is loose or worn. Avoid having throw rugs at the top or bottom of the stairs. If you do have throw rugs, attach them to the floor with carpet tape. Make sure that you have a light switch at the top of the stairs and the bottom of the stairs. If you do not have them, ask someone to add them for you. What else can I do to help prevent falls? Wear shoes that: Do not have high heels. Have rubber bottoms. Are comfortable and fit you  well. Are closed at the toe. Do not wear sandals. If you use a stepladder: Make sure that it is fully opened. Do not climb a closed stepladder. Make sure that both sides of the stepladder are locked into place. Ask someone to hold it for you, if possible. Clearly mark and make sure that you can see: Any grab bars or handrails. First and last steps. Where the edge of each step is. Use tools that help you move around (mobility aids) if they are needed. These include: Canes. Walkers. Scooters. Crutches. Turn on the lights when you go into a dark area. Replace any light bulbs as soon as they burn out. Set up your furniture so you have a clear path. Avoid moving your furniture around. If any of your floors are uneven, fix them. If there are any pets around you, be aware of where they are. Review your medicines with your doctor. Some medicines can make you feel dizzy. This can increase your chance of falling. Ask your doctor what other things that you can do to help prevent falls. This information is not intended to replace advice given to you by your health care provider. Make sure you discuss any questions you have with your health care provider. Document Released: 06/14/2009 Document Revised: 01/24/2016 Document Reviewed: 09/22/2014 Elsevier Interactive Patient Education  2017 [employer].

## 2021-07-23 NOTE — Progress Notes (Signed)
Subjective:   Robin Bryan is a 70 y.o. female who presents for Medicare Annual (Subsequent) preventive examination.  Virtual Visit via Telephone Note  I connected with  Robin Bryan on 07/23/21 at  3:30 PM EST by telephone and verified that I am speaking with the correct person using two identifiers.  Location: Patient: home Provider: [place] Persons participating in the virtual visit: [place]   I discussed the limitations, risks, security and privacy concerns of performing an evaluation and management service by telephone and the availability of in person appointments. The patient expressed understanding and agreed to proceed.  Interactive audio and video telecommunications were attempted between this nurse and patient, however failed, due to patient having technical difficulties OR patient did not have access to video capability.  We continued and completed visit with audio only.  Some vital signs may be absent or patient reported.   Clemetine Marker, LPN   Review of Systems           Objective:    There were no vitals filed for this visit. There is no height or weight on file to calculate BMI.  Advanced Directives 07/23/2021 05/22/2020 05/17/2019 05/13/2018 05/12/2017 04/03/2017 02/26/2017  Does Patient Have a Medical Advance Directive? No No No No No No No  Would patient like information on creating a medical advance directive? Yes (MAU/Ambulatory/Procedural Areas - Information given) Yes (MAU/Ambulatory/Procedural Areas - Information given) Yes (MAU/Ambulatory/Procedural Areas - Information given) Yes (MAU/Ambulatory/Procedural Areas - Information given) - - -    Current Medications (verified) Outpatient Encounter Medications as of 07/23/2021  Medication Sig   albuterol (VENTOLIN HFA) 108 (90 Base) MCG/ACT inhaler Inhale 2 puffs into the lungs every 6 (six) hours as needed for wheezing or shortness of breath.   clopidogrel (PLAVIX) 75 MG tablet Take  1 tablet (75 mg total) by mouth daily.   clotrimazole-betamethasone (LOTRISONE) cream Apply 1 application topically 2 (two) times daily.   diphenhydrAMINE (BENADRYL) 25 mg capsule Take 1 capsule (25 mg total) by mouth every 8 (eight) hours as needed.   escitalopram (LEXAPRO) 10 MG tablet Take 1 tablet (10 mg total) by mouth daily.   fluticasone (FLONASE) 50 MCG/ACT nasal spray SPRAY 2 SPRAYS INTO EACH NOSTRIL EVERY DAY   fluticasone-salmeterol (ADVAIR HFA) 45-21 MCG/ACT inhaler Inhale 2 puffs into the lungs 2 (two) times daily.   gabapentin (NEURONTIN) 400 MG capsule Take 800 mg by mouth 4 (four) times daily.    guaiFENesin-dextromethorphan (ROBITUSSIN DM) 100-10 MG/5ML syrup Take 10 mLs by mouth every 6 (six) hours as needed for cough.   hydrochlorothiazide (HYDRODIURIL) 12.5 MG tablet TAKE 1 TABLET BY MOUTH EVERY DAY   loratadine (CLARITIN) 10 MG tablet Take 1 tablet (10 mg total) by mouth daily.   meloxicam (MOBIC) 15 MG tablet Take 1 tablet (15 mg total) by mouth daily.   MYRBETRIQ 50 MG TB24 tablet TK 1 T PO QD   oxyCODONE-acetaminophen (PERCOCET/ROXICET) 5-325 MG tablet Take 1 tablet by mouth every 6 (six) hours as needed. for pain   pantoprazole (PROTONIX) 40 MG tablet Office visit for further refills.  Take one tablet by mouth twice a day;   primidone (MYSOLINE) 50 MG tablet Take 1 tablet by mouth daily.   propranolol ER (INDERAL LA) 60 MG 24 hr capsule Take 60 mg by mouth daily.    rosuvastatin (CRESTOR) 10 MG tablet Take 1 tablet (10 mg total) by mouth daily.   tiZANidine (ZANAFLEX) 4 MG tablet Take 4-8 mg by  mouth at bedtime.   valsartan (DIOVAN) 80 MG tablet Take 1 tablet (80 mg total) by mouth daily.   EPINEPHrine 0.3 mg/0.3 mL IJ SOAJ injection Inject 0.3 mLs (0.3 mg total) into the muscle once for 1 dose.   [DISCONTINUED] APPLE CIDER VINEGAR PO Take by mouth. [place] gummies   No facility-administered encounter medications on file as of 07/23/2021.    Allergies  (verified) Shellfish allergy and Vicodin [hydrocodone-acetaminophen]   History: Past Medical History:  Diagnosis Date   Allergy    Anxiety    Arthritis    Arthritis of both feet 11/26/2017   xrays March 2019   Asthma    Barrett's esophagus    Bronchitis    Calcaneal spur of both feet 11/26/2017   Foot xrays March 2019   Depression    Diverticulosis    Esophageal stricture    Fatty liver 09/08/2018   [place] Jan 2020   Fibromyalgia    GERD (gastroesophageal reflux disease)    Hx of adenomatous colonic polyps    Hyperlipidemia    Hypertension    Intramural leiomyoma of uterus    Liver hemangioma 09/08/2018   [place] Jan 2020   Myocardial infarction [facility])    Stroke [facility])    Past Surgical History:  Procedure Laterality Date   ABDOMINAL HYSTERECTOMY     CATARACT EXTRACTION Bilateral 03/2019   INCONTINENCE SURGERY     TUBAL LIGATION     tumor excision ovaries     Family History  Problem Relation Age of Onset   Colon polyps Mother    Diabetes Mother    Cancer - Lung Father    Lung cancer Father    Ovarian cancer Sister    Cancer Sister    Diabetes Sister    Heart disease Sister    Lung cancer Sister    Atrial fibrillation Sister    Cancer - Ovarian Sister    Cancer - Lung Sister    Diabetes Daughter    Parkinson's disease Cousin    Colon cancer Neg Hx    Esophageal cancer Neg Hx    Pancreatic cancer Neg Hx    Stomach cancer Neg Hx    Social History   Socioeconomic History   Marital status: Married    Spouse name: Robin Bryan   Number of children: 2   Years of education: some collee   Highest education level: 12th grade  Occupational History   Occupation: Retired  Tobacco Use   Smoking status: Former    Packs/day: 0.50    Years: 8.00    Pack years: 4.00    Types: Cigarettes    Quit date: 05/27/2011    Years since quitting: 10.1   Smokeless tobacco: Never   Tobacco comments:    smoking cessation materials not required  Vaping Use   Vaping Use: Never used   Substance and Sexual Activity   Alcohol use: No   Drug use: No   Sexual activity: Yes    Birth control/protection: Post-menopausal  Other Topics Concern   Not on file  Social History Narrative   Not on file   Social Determinants of Health   Financial Resource Strain: Low Risk    Difficulty of Paying Living Expenses: Not hard at all  Food Insecurity: No Food Insecurity   Worried About Charity fundraiser in the Last Year: Never true   Ran Out of Food in the Last Year: Never true  Transportation Needs: No Transportation Needs   Lack of  Transportation (Medical): No   Lack of Transportation (Non-Medical): No  Physical Activity: Inactive   Days of Exercise per Week: 0 days   Minutes of Exercise per Session: 0 min  Stress: No Stress Concern Present   Feeling of Stress : Only a little  Social Connections: Moderately Integrated   Frequency of Communication with Friends and Family: More than three times a week   Frequency of Social Gatherings with Friends and Family: More than three times a week   Attends Religious Services: More than 4 times per year   Active Member of [employer] or Organizations: No   Attends Music therapist: Never   Marital Status: Married    Tobacco Counseling Counseling given: Not Answered Tobacco comments: smoking cessation materials not required   Clinical Intake:  Pre-visit preparation completed: Yes  Pain : No/denies pain     Nutritional Risks: None Diabetes: No  How often do you need to have someone help you when you read instructions, pamphlets, or other written materials from your doctor or pharmacy?: 1 - Never    Interpreter Needed?: No  Information entered by :: Clemetine Marker LPN   Activities of Daily Living In your present state of health, do you have any difficulty performing the following activities: 07/23/2021 05/03/2021  Hearing? N N  Vision? N N  Difficulty concentrating or making decisions? N N  Walking or climbing  stairs? N N  Dressing or bathing? N N  Doing errands, shopping? N N  Preparing Food and eating ? N -  Using the Toilet? N -  In the past six months, have you accidently leaked urine? Y -  Do you have problems with loss of bowel control? N -  Managing your Medications? N -  Managing your Finances? N -  Housekeeping or managing your Housekeeping? N -  Some recent data might be hidden    Patient Care Team: Bo Merino, FNP as PCP - General (Nurse Practitioner) Irine Seal, MD as Consulting Physician (Urology) Boyd Kerbs, MD as Referring Physician (Specialist) Edrick Kins, DPM as Consulting Physician (Podiatry)  Indicate any recent Medical Services you may have received from other than [facility] providers in the past year (date may be approximate).     Assessment:   This is a routine wellness examination for Lauralee.  Hearing/Vision screen Hearing Screening - Comments:: Pt denies hearing difficulty Vision Screening - Comments:: Annual vision screenings done by Dr. Ellin Mayhew  Dietary issues and exercise activities discussed:     Goals Addressed             This Visit's Progress    DIET - INCREASE WATER INTAKE   On track    Recommend to drink at least 6-8 8oz glasses of water per day.     Increase physical activity   Not on track    Recommend increasing physical activity to at least 3 days per week       Depression Screen PHQ 2/9 Scores 07/23/2021 05/03/2021 01/31/2021 07/31/2020 05/22/2020 01/24/2020 10/25/2019  PHQ - 2 Score 0 0 0 0 0 0 0  PHQ- 9 Score - 0 6 - - 0 0    Fall Risk Fall Risk  07/23/2021 05/03/2021 01/31/2021 07/31/2020 05/22/2020  Falls in the past year? 1 0 0 1 1  Number falls in past yr: 0 0 0 1 1  Comment - - - - fell on floating boat dock  Injury with Fall? 0 0 0 0 1  Comment - - - - -  Risk Factor Category  - - - - -  Risk for fall due to : No Fall Risks - - - History of fall(s)  Risk for fall due to: Comment - - - - -  Follow up Falls prevention  discussed - Falls evaluation completed - Falls prevention discussed    FALL RISK PREVENTION PERTAINING TO THE HOME:  Any stairs in or around the home? Yes  If so, are there any without handrails? No  Home free of loose throw rugs in walkways, pet beds, electrical cords, etc? Yes  Adequate lighting in your home to reduce risk of falls? Yes   ASSISTIVE DEVICES UTILIZED TO PREVENT FALLS:  Life alert? No  Use of a cane, walker or w/c? No  Grab bars in the bathroom? No  Shower chair or bench in shower? Yes  Elevated toilet seat or a handicapped toilet? Yes   TIMED UP AND GO:  Was the test performed? No . Telephonic visit.   Cognitive Function: Normal cognitive status assessed by direct observation by this Nurse Health Advisor. No abnormalities found.       6CIT Screen 05/22/2020 05/17/2019 05/13/2018  What Year? 0 points 0 points 0 points  What month? 0 points 0 points 0 points  What time? 0 points 0 points 0 points  Count back from 20 0 points 0 points 0 points  Months in reverse 0 points 0 points 0 points  Repeat phrase 0 points 0 points 2 points  Total Score 0 0 2    Immunizations Immunization History  Administered Date(s) Administered   Influenza, High Dose Seasonal PF 05/13/2018, 05/31/2019, 06/09/2021   Influenza,inj,Quad PF,6+ Mos 07/13/2015   Influenza-Unspecified 06/10/2017, 07/06/2020   Moderna SARS-COV2 Booster Vaccination 07/23/2020   PFIZER(Purple Top)SARS-COV-2 Vaccination 10/27/2019, 11/23/2019   Pneumococcal Conjugate-13 07/06/2017   Pneumococcal Polysaccharide-23 08/31/2018   Tdap 04/03/2017    TDAP status: Up to date  Flu Vaccine status: Up to date  Pneumococcal vaccine status: Up to date  Covid-19 vaccine status: Completed vaccines  Qualifies for Shingles Vaccine? Yes   Zostavax completed No   Shingrix Completed?: Yes - due for second dose  Screening Tests Health Maintenance  Topic Date Due   Zoster Vaccines- Shingrix (1 of 2) Never done    COVID-19 Vaccine (3 - Pfizer risk series) 08/20/2020   DEXA SCAN  05/02/2021   MAMMOGRAM  07/24/2021   COLONOSCOPY (Pts 45-26yrs Insurance coverage will need to be confirmed)  03/18/2022   TETANUS/TDAP  04/04/2027   Pneumonia Vaccine 31+ Years old  Completed   INFLUENZA VACCINE  Completed   Hepatitis C Screening  Completed   HPV VACCINES  Aged Out    Health Maintenance  Health Maintenance Due  Topic Date Due   Zoster Vaccines- Shingrix (1 of 2) Never done   COVID-19 Vaccine (3 - Pfizer risk series) 08/20/2020   DEXA SCAN  05/02/2021    Colorectal cancer screening: Type of screening: Colonoscopy. Completed 03/18/17. Repeat every 5 years  Mammogram status: Completed 07/24/20. Repeat every year  Bone Density status: Completed 05/03/19. Results reflect: Bone density results: NORMAL. Repeat every 2-3 years.  Lung Cancer Screening: (Low Dose CT Chest recommended if Age 20-80 years, 30 pack-year currently smoking OR have quit w/in 15years.) does not qualify.   Additional Screening:  Hepatitis C Screening: does qualify; Completed 04/03/17  Vision Screening: Recommended annual ophthalmology exams for early detection of glaucoma and other disorders of the eye. Is the patient up to date with their annual  eye exam?  Yes  Who is the provider or what is the name of the office in which the patient attends annual eye exams? Dr. Ellin Mayhew.   Dental Screening: Recommended annual dental exams for proper oral hygiene  Community Resource Referral / Chronic Care Management: CRR required this visit?  No   CCM required this visit?  No      Plan:     I have personally reviewed and noted the following in the patient's chart:   Medical and social history Use of alcohol, tobacco or illicit drugs  Current medications and supplements including opioid prescriptions.  Functional ability and status Nutritional status Physical activity Advanced directives List of other physicians Hospitalizations,  surgeries, and ER visits in previous 12 months Vitals Screenings to include cognitive, depression, and falls Referrals and appointments  In addition, I have reviewed and discussed with patient certain preventive protocols, quality metrics, and best practice recommendations. A written personalized care plan for preventive services as well as general preventive health recommendations were provided to patient.     Clemetine Marker, LPN   [id number]   Nurse Notes: none

## 2021-07-29 DIAGNOSIS — H02839 Dermatochalasis of unspecified eye, unspecified eyelid: Secondary | ICD-10-CM | POA: Diagnosis not present

## 2021-07-29 DIAGNOSIS — H02403 Unspecified ptosis of bilateral eyelids: Secondary | ICD-10-CM | POA: Diagnosis not present

## 2021-07-29 DIAGNOSIS — H5340 Unspecified visual field defects: Secondary | ICD-10-CM | POA: Diagnosis not present

## 2021-07-30 ENCOUNTER — Ambulatory Visit: Payer: Medicare HMO

## 2021-07-30 DIAGNOSIS — H5340 Unspecified visual field defects: Secondary | ICD-10-CM | POA: Insufficient documentation

## 2021-07-30 DIAGNOSIS — H02839 Dermatochalasis of unspecified eye, unspecified eyelid: Secondary | ICD-10-CM | POA: Insufficient documentation

## 2021-07-30 DIAGNOSIS — H02403 Unspecified ptosis of bilateral eyelids: Secondary | ICD-10-CM | POA: Insufficient documentation

## 2021-08-21 ENCOUNTER — Other Ambulatory Visit: Payer: Self-pay | Admitting: Podiatry

## 2021-08-21 NOTE — Telephone Encounter (Signed)
Please advise 

## 2021-08-29 DIAGNOSIS — M5417 Radiculopathy, lumbosacral region: Secondary | ICD-10-CM | POA: Diagnosis not present

## 2021-08-29 DIAGNOSIS — M5412 Radiculopathy, cervical region: Secondary | ICD-10-CM | POA: Diagnosis not present

## 2021-08-29 DIAGNOSIS — R259 Unspecified abnormal involuntary movements: Secondary | ICD-10-CM | POA: Diagnosis not present

## 2021-08-29 DIAGNOSIS — G5603 Carpal tunnel syndrome, bilateral upper limbs: Secondary | ICD-10-CM | POA: Diagnosis not present

## 2021-08-29 DIAGNOSIS — Z79899 Other long term (current) drug therapy: Secondary | ICD-10-CM | POA: Diagnosis not present

## 2021-08-29 DIAGNOSIS — G5621 Lesion of ulnar nerve, right upper limb: Secondary | ICD-10-CM | POA: Diagnosis not present

## 2021-08-29 DIAGNOSIS — I639 Cerebral infarction, unspecified: Secondary | ICD-10-CM | POA: Diagnosis not present

## 2021-08-29 DIAGNOSIS — R443 Hallucinations, unspecified: Secondary | ICD-10-CM | POA: Diagnosis not present

## 2021-09-03 ENCOUNTER — Other Ambulatory Visit: Payer: Self-pay

## 2021-09-03 ENCOUNTER — Ambulatory Visit
Admission: RE | Admit: 2021-09-03 | Discharge: 2021-09-03 | Disposition: A | Discharge status: Home / Self Care | Payer: Medicare HMO | Source: Ambulatory Visit | Attending: Specialist | Admitting: Specialist

## 2021-09-03 DIAGNOSIS — Z1231 Encounter for screening mammogram for malignant neoplasm of breast: Secondary | ICD-10-CM | POA: Diagnosis not present

## 2021-09-06 ENCOUNTER — Other Ambulatory Visit: Payer: Self-pay | Admitting: Nurse Practitioner

## 2021-09-06 NOTE — Telephone Encounter (Signed)
Requested medication (s) are due for refill today: no  Requested medication (s) are on the active medication list: yes  Last refill:  05/22/21 #90/1  Future visit scheduled: no  Notes to clinic:  Unable to refill per protocol due to failed labs, no updated results.      Requested Prescriptions  Pending Prescriptions Disp Refills   clopidogrel (PLAVIX) 75 MG tablet [Pharmacy Med Name: CLOPIDOGREL 75 MG TABLET] 90 tablet 1    Sig: TAKE 1 TABLET BY MOUTH EVERY DAY     Hematology: Antiplatelets - clopidogrel Failed - 09/06/2021 10:13 AM      Failed - Evaluate AST, ALT within 2 months of therapy initiation.      Failed - HCT in normal range and within 180 days    HCT  Date Value Ref Range Status  01/31/2021 37.8 35.0 - 45.0 % Final   Hematocrit  Date Value Ref Range Status  10/09/2015 41.5 34.0 - 46.6 % Final          Failed - HGB in normal range and within 180 days    Hemoglobin  Date Value Ref Range Status  01/31/2021 12.8 11.7 - 15.5 g/dL Final  10/09/2015 14.3 11.1 - 15.9 g/dL Final          Failed - PLT in normal range and within 180 days    Platelets  Date Value Ref Range Status  01/31/2021 256 140 - 400 Thousand/uL Final  10/09/2015 324 150 - 379 x10E3/uL Final          Passed - ALT in normal range and within 360 days    ALT  Date Value Ref Range Status  01/31/2021 15 6 - 29 U/L Final          Passed - AST in normal range and within 360 days    AST  Date Value Ref Range Status  01/31/2021 12 10 - 35 U/L Final          Passed - Valid encounter within last 6 months    Recent Outpatient Visits           4 months ago Shortness of breath   [facility] Serafina Royals F, FNP   7 months ago Obesity (BMI 30.0-34.9)   [facility] Kathrine Haddock, NP   1 year ago Seasonal allergic rhinitis due to pollen   [place], MD   1 year ago Seasonal allergic rhinitis due to pollen    [facility] Tow[place] Malkin, MD   1 year ago Benign essential HTN   [place], MD       Future Appointments             In 60 months [facility]

## 2021-09-24 ENCOUNTER — Encounter: Payer: Self-pay | Admitting: Nurse Practitioner

## 2021-09-24 ENCOUNTER — Telehealth (INDEPENDENT_AMBULATORY_CARE_PROVIDER_SITE_OTHER): Payer: Medicare HMO | Admitting: Nurse Practitioner

## 2021-09-24 ENCOUNTER — Other Ambulatory Visit: Payer: Self-pay

## 2021-09-24 VITALS — Temp 98.7°F

## 2021-09-24 DIAGNOSIS — J4520 Mild intermittent asthma, uncomplicated: Secondary | ICD-10-CM

## 2021-09-24 DIAGNOSIS — J069 Acute upper respiratory infection, unspecified: Secondary | ICD-10-CM

## 2021-09-24 LAB — POCT INFLUENZA A/B
Influenza A, POC: NEGATIVE
Influenza B, POC: NEGATIVE

## 2021-09-24 MED ORDER — GUAIFENESIN-DM 100-10 MG/5ML PO SYRP: 10.0000 mL | ORAL_SOLUTION | Freq: Four times a day (QID) | ORAL | 0 refills | Status: DC | PRN

## 2021-09-24 NOTE — Progress Notes (Signed)
Name: Robin Bryan   MRN: [id number]    DOB: [birth year]   Date:09/24/2021       Progress Note  Subjective  Chief Complaint  Chief Complaint  Patient presents with   URI    Chills, runny nose, sneezing, cough, sore throat for 3 days    I connected with  Dalbert Mayotte Marschall  on 09/24/21 at 12:45 pm by a video enabled telemedicine application and verified that I am speaking with the correct person using two identifiers.  I discussed the limitations of evaluation and management by telemedicine and the availability of in person appointments. The patient expressed understanding and agreed to proceed with a virtual visit  Staff also discussed with the patient that there may be a patient responsible charge related to this service. Patient Location: home Provider Location: [facility] Additional Individuals present: alone  HPI  URI: She says three days ago she started having sore throat, cough, sneezing, nasal congestion, runny nose and chills. She denies any fever or shortness of breath.  She says she took a home covid test and it was negative.  She has been taking cough medication and cough drops to help but she says she is getting very little relief.  She says that tessalon perls do not help.  Will send in cough medication, discussed OTC treatments for symptoms.  She is going to come by for covid and flu testing.  Discussed treatment and she does want covid treatment if positive.    Asthma: She says she has been using her albuterol inhaler once a day.  She says she stopped using her advair.  Discussed that she should be using her advair twice a day and that will help her from having to use her albuterol inhaler so often.  She verbalized understanding.   Patient Active Problem List   Diagnosis Date Noted   Dermatochalasis of eyelid 07/30/2021   Ptosis, both eyelids 07/30/2021   Visual field defect 07/30/2021   Seasonal allergic rhinitis due to pollen 01/24/2020   Prediabetes 01/24/2020   Mild  intermittent asthma without complication 24/40/1027   Cataract of both eyes 10/25/2019   Liver hemangioma 09/08/2018   Fatty liver 09/08/2018   Overactive bladder 03/19/2018   Obesity (BMI 30.0-34.9) 02/25/2018   Estrogen deficiency 01/08/2018   Postmenopausal 01/07/2018   Back pain 11/26/2017   Chronic kidney disease, stage III (moderate) (HCC) 11/26/2017   Calcaneal spur of both feet 11/26/2017   Arthritis of both feet 11/26/2017   Acute left-sided thoracic back pain 10/30/2015   Temporary cerebral vascular dysfunction 07/16/2015   Adaptation reaction 07/16/2015   Foot pain 07/16/2015   Acid reflux 07/16/2015   Abnormal LFTs 07/16/2015   Pericardial effusion 07/13/2015   Mass of chest wall, left 06/29/2015   Lipoma 06/29/2015   Pure hypercholesterolemia 04/28/2015   Gastro-esophageal reflux disease without esophagitis 04/28/2015   Benign essential HTN 04/28/2015   Hyperlipidemia 03/19/2015   Elevated hemoglobin A1c 03/19/2015   Neck muscle spasm 03/19/2015   Cognitive change 02/07/2015   Persistent dry cough 02/07/2015   Major depression in partial remission ([place]) 02/07/2015   Fibrositis 02/07/2015   Anxiety disorder 02/07/2015   History of anaphylaxis 02/07/2015   Pubic bone pain 10/02/2014   Mass of pelvis 06/05/2014   Ovarian cyst, left 05/26/2014   Abdominal pain, chronic, left lower quadrant 05/26/2014   CEREBROVASCULAR DISEASE 04/23/2010   COLONIC POLYPS, ADENOMATOUS 12/09/2006   ESOPHAGEAL STRICTURE 12/09/2006   GERD 12/09/2006   BARRETTS ESOPHAGUS 12/09/2006  DIVERTICULOSIS, COLON 12/09/2006    Social History   Tobacco Use   Smoking status: Former    Packs/day: 0.50    Years: 8.00    Pack years: 4.00    Types: Cigarettes    Quit date: 05/27/2011    Years since quitting: 10.3   Smokeless tobacco: Never   Tobacco comments:    smoking cessation materials not required  Substance Use Topics   Alcohol use: No     Current Outpatient Medications:     albuterol (VENTOLIN HFA) 108 (90 Base) MCG/ACT inhaler, Inhale 2 puffs into the lungs every 6 (six) hours as needed for wheezing or shortness of breath., Disp: 6.7 g, Rfl: 3   clopidogrel (PLAVIX) 75 MG tablet, Take 1 tablet (75 mg total) by mouth daily., Disp: 90 tablet, Rfl: 1   clotrimazole-betamethasone (LOTRISONE) cream, Apply 1 application topically 2 (two) times daily., Disp: 45 g, Rfl: 1   diphenhydrAMINE (BENADRYL) 25 mg capsule, Take 1 capsule (25 mg total) by mouth every 8 (eight) hours as needed., Disp: 30 capsule, Rfl: 0   escitalopram (LEXAPRO) 10 MG tablet, Take 1 tablet (10 mg total) by mouth daily., Disp: 90 tablet, Rfl: 3   fluticasone (FLONASE) 50 MCG/ACT nasal spray, SPRAY 2 SPRAYS INTO EACH NOSTRIL EVERY DAY, Disp: 48 mL, Rfl: 1   fluticasone-salmeterol (ADVAIR HFA) 45-21 MCG/ACT inhaler, Inhale 2 puffs into the lungs 2 (two) times daily., Disp: 1 each, Rfl: 12   gabapentin (NEURONTIN) 400 MG capsule, Take 800 mg by mouth 4 (four) times daily. , Disp: , Rfl:    hydrochlorothiazide (HYDRODIURIL) 12.5 MG tablet, TAKE 1 TABLET BY MOUTH EVERY DAY, Disp: 90 tablet, Rfl: 1   loratadine (CLARITIN) 10 MG tablet, Take 1 tablet (10 mg total) by mouth daily., Disp: 90 tablet, Rfl: 3   meloxicam (MOBIC) 15 MG tablet, TAKE 1 TABLET (15 MG TOTAL) BY MOUTH DAILY., Disp: 30 tablet, Rfl: 1   MYRBETRIQ 50 MG TB24 tablet, TK 1 T PO QD, Disp: , Rfl: 3   oxyCODONE-acetaminophen (PERCOCET/ROXICET) 5-325 MG tablet, Take 1 tablet by mouth every 6 (six) hours as needed. for pain, Disp: , Rfl:    pantoprazole (PROTONIX) 40 MG tablet, Office visit for further refills.  Take one tablet by mouth twice a day;, Disp: 180 tablet, Rfl: 0   primidone (MYSOLINE) 50 MG tablet, Take 1 tablet by mouth daily., Disp: , Rfl: 6   propranolol ER (INDERAL LA) 60 MG 24 hr capsule, Take 60 mg by mouth daily. , Disp: , Rfl:    rosuvastatin (CRESTOR) 10 MG tablet, Take 1 tablet (10 mg total) by mouth daily., Disp: 90 tablet,  Rfl: 3   tiZANidine (ZANAFLEX) 4 MG tablet, Take 4-8 mg by mouth at bedtime., Disp: , Rfl:    valsartan (DIOVAN) 80 MG tablet, Take 1 tablet (80 mg total) by mouth daily., Disp: 90 tablet, Rfl: 3   EPINEPHrine 0.3 mg/0.3 mL IJ SOAJ injection, Inject 0.3 mLs (0.3 mg total) into the muscle once for 1 dose., Disp: 0.3 mL, Rfl: 0   guaiFENesin-dextromethorphan (ROBITUSSIN DM) 100-10 MG/5ML syrup, Take 10 mLs by mouth every 6 (six) hours as needed for cough. (Patient not taking: Reported on 09/24/2021), Disp: 118 mL, Rfl: 0  Allergies  Allergen Reactions   Shellfish Allergy Swelling   Vicodin [Hydrocodone-Acetaminophen] Nausea Only    I personally reviewed active problem list, medication list, allergies, notes from last encounter with the patient/caregiver today.  ROS  Constitutional: Negative for fever or  weight change. Positive for chills HEENT: positive for sore throat, nasal congestion, sneezing and runny nose Respiratory: Positive for cough, negative for shortness of breath.   Cardiovascular: Negative for chest pain or palpitations.  Gastrointestinal: Negative for abdominal pain, no bowel changes.  Musculoskeletal: Negative for gait problem or joint swelling.  Skin: Negative for rash.  Neurological: Negative for dizziness or headache.  No other specific complaints in a complete review of systems (except as listed in HPI above).   Objective  Virtual encounter, vitals not obtained.  There is no height or weight on file to calculate BMI.  Nursing Note and Vital Signs reviewed.  Physical Exam  Awake, alert and oriented, speaking in complete sentences  No results found for this or any previous visit (from the past 72 hour(s)).  Assessment & Plan  1. Viral upper respiratory tract infection  - Novel Coronavirus, NAA ([facility]) - POCT Influenza A/B - guaiFENesin-dextromethorphan (ROBITUSSIN DM) 100-10 MG/5ML syrup; Take 10 mLs by mouth every 6 (six) hours as needed for cough.   Dispense: 118 mL; Refill: 0  2. Mild intermittent asthma without complication -start using your adviar twice a day   -Red flags and when to present for emergency care or RTC including fever >101.74F, chest pain, shortness of breath, new/worsening/un-resolving symptoms,  reviewed with patient at time of visit. Follow up and care instructions discussed and provided in AVS. - I discussed the assessment and treatment plan with the patient. The patient was provided an opportunity to ask questions and all were answered. The patient agreed with the plan and demonstrated an understanding of the instructions.  I provided 20 minutes of non-face-to-face time during this encounter.  Bo Merino, FNP

## 2021-09-25 ENCOUNTER — Encounter: Payer: Self-pay | Admitting: Nurse Practitioner

## 2021-09-25 DIAGNOSIS — J069 Acute upper respiratory infection, unspecified: Secondary | ICD-10-CM | POA: Diagnosis not present

## 2021-09-26 LAB — SARS-COV-2, NAA 2 DAY TAT

## 2021-09-26 LAB — NOVEL CORONAVIRUS, NAA: SARS-CoV-2, NAA: NOT DETECTED

## 2021-09-26 LAB — SPECIMEN STATUS REPORT

## 2021-10-01 ENCOUNTER — Other Ambulatory Visit: Payer: Self-pay

## 2021-10-01 ENCOUNTER — Other Ambulatory Visit: Payer: Self-pay | Admitting: Nurse Practitioner

## 2021-10-01 ENCOUNTER — Ambulatory Visit (INDEPENDENT_AMBULATORY_CARE_PROVIDER_SITE_OTHER): Payer: Medicare HMO | Admitting: Nurse Practitioner

## 2021-10-01 ENCOUNTER — Encounter: Payer: Self-pay | Admitting: Nurse Practitioner

## 2021-10-01 VITALS — BP 124/86 | HR 77 | Temp 98.1°F | Resp 16 | Ht 68.0 in | Wt 229.4 lb

## 2021-10-01 DIAGNOSIS — E889 Metabolic disorder, unspecified: Secondary | ICD-10-CM

## 2021-10-01 DIAGNOSIS — E669 Obesity, unspecified: Secondary | ICD-10-CM

## 2021-10-01 MED ORDER — WEGOVY 0.5 MG/0.5ML ~~LOC~~ SOAJ: 0.5000 mg | SUBCUTANEOUS | 0 refills | Status: DC

## 2021-10-01 NOTE — Telephone Encounter (Signed)
°  Notes to clinic:  Pharm has sent request as follows:  Pharmacy comment: Alternative Requested:NON FORMULARY.   Requested Prescriptions  Pending Prescriptions Disp Refills   WEGOVY 0.5 MG/0.5ML SOAJ [Pharmacy Med Name: WEGOVY 0.5 MG/0.5 ML PEN]  0    Sig: Inject 0.5 mg into the skin once a week.     Endocrinology:  Diabetes - GLP-1 Receptor Agonists - semaglutide Failed - 10/01/2021  3:50 PM      Failed - HBA1C in normal range and within 180 days    Hgb A1c MFr Bld  Date Value Ref Range Status  01/31/2021 5.6 <5.7 % of total Hgb Final    Comment:    For the purpose of screening for the presence of diabetes: . <5.7%       Consistent with the absence of diabetes 5.7-6.4%    Consistent with increased risk for diabetes             (prediabetes) > or =6.5%  Consistent with diabetes . This assay result is consistent with a decreased risk of diabetes. . Currently, no consensus exists regarding use of hemoglobin A1c for diagnosis of diabetes in children. . According to American Diabetes Association (ADA) guidelines, hemoglobin A1c <7.0% represents optimal control in non-pregnant diabetic patients. Different metrics may apply to specific patient populations.  Standards of Medical Care in Diabetes(ADA). .           Passed - Cr in normal range and within 360 days    Creat  Date Value Ref Range Status  01/31/2021 0.80 0.50 - 0.99 mg/dL Final    Comment:    For patients >94 years of age, the reference limit for Creatinine is approximately 13% higher for people identified as African-American. [name] - Valid encounter within last 6 months    Recent Outpatient Visits           Today Obesity (BMI 30.0-34.9)   [facility] Bo Merino, FNP   1 week ago Viral upper respiratory tract infection   [place], FNP   5 months ago Shortness of breath   [facility] Serafina Royals F, FNP    8 months ago Obesity (BMI 30.0-34.9)   [facility] Kathrine Haddock, NP   1 year ago Seasonal allergic rhinitis due to pollen   [place], MD       Future Appointments             In 37 months [facility], [facility]

## 2021-10-01 NOTE — Progress Notes (Signed)
BP 124/86    Pulse 77    Temp 98.1 F (36.7 C) (Oral)    Resp 16    Ht 5\' 8"  (1.727 m)    Wt 229 lb 6.4 oz (104.1 kg)    SpO2 98%    BMI 34.88 kg/m    Subjective:    Patient ID: Robin Bryan, female    DOB: 1951-05-09, 71 y.o.   MRN: [id number]  HPI: Robin Bryan is a 71 y.o. female  Chief Complaint  Patient presents with   Obesity    Discuss weight     Obesity/metabolic disorder: She has been doing diet and exercise but feels like the weight just keeps climbing.  She has tried phentermine with little success.  Her highest weight was 254 lbs.  She is currently 229 lbs. With a BMI of 34.88. She does have weight modifiable illness like hypertension, fatty liver, and GERD. She says she would like to try one of those injectable medications. Discussed side effects.  She denies any pancreatitis or family history of thyroid cancer.  Gave her sample of wegovy and will send in prescription for next dose.    Relevant past medical, surgical, family and social history reviewed and updated as indicated. Interim medical history since our last visit reviewed. Allergies and medications reviewed and updated.  Review of Systems  Constitutional: Negative for fever or weight change.  Respiratory: Negative for cough and shortness of breath.   Cardiovascular: Negative for chest pain or palpitations.  Gastrointestinal: Negative for abdominal pain, no bowel changes.  Musculoskeletal: Negative for gait problem or joint swelling.  Skin: Negative for rash.  Neurological: Negative for dizziness or headache.  No other specific complaints in a complete review of systems (except as listed in HPI above).      Objective:    BP 124/86    Pulse 77    Temp 98.1 F (36.7 C) (Oral)    Resp 16    Ht 5\' 8"  (1.727 m)    Wt 229 lb 6.4 oz (104.1 kg)    SpO2 98%    BMI 34.88 kg/m   Wt Readings from Last 3 Encounters:  10/01/21 229 lb 6.4 oz (104.1 kg)  05/03/21 227 lb (103 kg)  01/31/21 227 lb 12.8 oz  (103.3 kg)    Physical Exam  Constitutional: Patient appears well-developed and well-nourished. Obese  No distress.  HEENT: head atraumatic, normocephalic, pupils equal and reactive to light, neck supple Cardiovascular: Normal rate, regular rhythm and normal heart sounds.  No murmur heard. No BLE edema. Pulmonary/Chest: Effort normal and breath sounds normal. No respiratory distress. Abdominal: Soft.  There is no tenderness. Psychiatric: Patient has a normal mood and affect. behavior is normal. Judgment and thought content normal.   Results for orders placed or performed in visit on 09/24/21  Novel Coronavirus, NAA ([facility])   Specimen: Nasopharyngeal(NP) swabs in vial transport medium   Nasopharynge  Previous  Result Value Ref Range   SARS-CoV-2, NAA Not Detected Not Detected  SARS-COV-2, NAA 2 DAY TAT   Nasopharynge  Previous  Result Value Ref Range   SARS-CoV-2, NAA 2 DAY TAT Performed   Specimen status report  Result Value Ref Range   specimen status report Comment   POCT Influenza A/B  Result Value Ref Range   Influenza A, POC Negative Negative   Influenza B, POC Negative Negative      Assessment & Plan:   1. Obesity (BMI 30.0-34.9) -continue making healthy food  choices -monitor portion size -drink water -get physical activity - Semaglutide-Weight Management (WEGOVY) 0.5 MG/0.5ML SOAJ; Inject 0.5 mg into the skin once a week.  Dispense: 2 mL; Refill: 0  2. Metabolic disorder -continue making healthy food choices -monitor portion size -drink water -get physical activity - Semaglutide-Weight Management (WEGOVY) 0.5 MG/0.5ML SOAJ; Inject 0.5 mg into the skin once a week.  Dispense: 2 mL; Refill: 0   Follow up plan: Return in about 3 months (around 12/29/2021) for follow up.

## 2021-10-16 ENCOUNTER — Other Ambulatory Visit: Payer: Self-pay | Admitting: Internal Medicine

## 2021-10-16 ENCOUNTER — Other Ambulatory Visit: Payer: Self-pay | Admitting: Nurse Practitioner

## 2021-10-16 ENCOUNTER — Other Ambulatory Visit: Payer: Self-pay | Admitting: Podiatry

## 2021-10-16 ENCOUNTER — Encounter: Payer: Self-pay | Admitting: Nurse Practitioner

## 2021-10-16 NOTE — Telephone Encounter (Signed)
Requested medications are due for refill today.  yes  Requested medications are on the active medications list.  yes  Last refill. 05/22/2021 #90 1 refill  Future visit scheduled.   yes  Notes to clinic.  Refill failed protocol d/t expired labs.    Requested Prescriptions  Pending Prescriptions Disp Refills   clopidogrel (PLAVIX) 75 MG tablet [Pharmacy Med Name: CLOPIDOGREL 75 MG TABLET] 90 tablet 1    Sig: TAKE 1 TABLET BY MOUTH EVERY DAY     Hematology: Antiplatelets - clopidogrel Failed - 10/16/2021  2:15 PM      Failed - HCT in normal range and within 180 days    HCT  Date Value Ref Range Status  01/31/2021 37.8 35.0 - 45.0 % Final   Hematocrit  Date Value Ref Range Status  10/09/2015 41.5 34.0 - 46.6 % Final          Failed - HGB in normal range and within 180 days    Hemoglobin  Date Value Ref Range Status  01/31/2021 12.8 11.7 - 15.5 g/dL Final  10/09/2015 14.3 11.1 - 15.9 g/dL Final          Failed - PLT in normal range and within 180 days    Platelets  Date Value Ref Range Status  01/31/2021 256 140 - 400 Thousand/uL Final  10/09/2015 324 150 - 379 x10E3/uL Final          Passed - Cr in normal range and within 360 days    Creat  Date Value Ref Range Status  01/31/2021 0.80 0.50 - 0.99 mg/dL Final    Comment:    For patients >27 years of age, the reference limit for Creatinine is approximately 13% higher for people identified as African-American. [name] - Valid encounter within last 6 months    Recent Outpatient Visits           2 weeks ago Obesity (BMI 30.0-34.9)   [facility] Bo Merino, FNP   3 weeks ago Viral upper respiratory tract infection   [place], FNP   5 months ago Shortness of breath   [facility] Serafina Royals F, FNP   8 months ago Obesity (BMI 30.0-34.9)   [facility] Kathrine Haddock, NP   1 year ago Seasonal  allergic rhinitis due to pollen   [place], MD       Future Appointments             In 10 months [facility], [facility]

## 2021-10-16 NOTE — Telephone Encounter (Signed)
Please advise, medication interaction with another drug that patient is taking

## 2021-10-18 ENCOUNTER — Other Ambulatory Visit: Payer: Self-pay

## 2021-10-20 ENCOUNTER — Other Ambulatory Visit: Payer: Self-pay | Admitting: Family Medicine

## 2021-10-20 DIAGNOSIS — E782 Mixed hyperlipidemia: Secondary | ICD-10-CM

## 2021-10-21 NOTE — Telephone Encounter (Signed)
Requested Prescriptions  Pending Prescriptions Disp Refills   rosuvastatin (CRESTOR) 10 MG tablet [Pharmacy Med Name: ROSUVASTATIN CALCIUM 10 MG TAB] 90 tablet 0    Sig: Take 1 tablet (10 mg total) by mouth daily. OFFICE VISIT NEEDED FOR ADDITIONAL REFILLS     Cardiovascular:  Antilipid - Statins 2 Failed - 10/20/2021 10:04 AM      Failed - Lipid Panel in normal range within the last 12 months    Cholesterol, Total  Date Value Ref Range Status  04/28/2016 127 100 - 199 mg/dL Final   Cholesterol  Date Value Ref Range Status  01/31/2021 113 <200 mg/dL Final   LDL Cholesterol (Calc)  Date Value Ref Range Status  01/31/2021 49 mg/dL (calc) Final    Comment:    Reference range: <100 . Desirable range <100 mg/dL for primary prevention;   <70 mg/dL for patients with CHD or diabetic patients  with > or = 2 CHD risk factors. Marland Kitchen LDL-C is now calculated using the Martin-Hopkins  calculation, which is a validated novel method providing  better accuracy than the Friedewald equation in the  estimation of LDL-C.  Cresenciano Genre et al. Annamaria Helling. 6378;588(50): 2061-2068  (http://education.[employer].com/faq/FAQ164)    HDL  Date Value Ref Range Status  01/31/2021 31 (L) > OR = 50 mg/dL Final  04/28/2016 37 (L) >39 mg/dL Final   Triglycerides  Date Value Ref Range Status  01/31/2021 314 (H) <150 mg/dL Final    Comment:    . If a non-fasting specimen was collected, consider repeat triglyceride testing on a fasting specimen if clinically indicated.  Yates Decamp et al. J. of Clin. Lipidol. 2774;1:287-867. Marland Kitchen          Passed - Cr in normal range and within 360 days    Creat  Date Value Ref Range Status  01/31/2021 0.80 0.50 - 0.99 mg/dL Final    Comment:    For patients >87 years of age, the reference limit for Creatinine is approximately 13% higher for people identified as African-American. [name] - Patient is not pregnant      Passed - Valid encounter within last 12  months    Recent Outpatient Visits          2 weeks ago Obesity (BMI 30.0-34.9)   [facility] Bo Merino, FNP   3 weeks ago Viral upper respiratory tract infection   [place], FNP   5 months ago Shortness of breath   [facility] Serafina Royals F, FNP   8 months ago Obesity (BMI 30.0-34.9)   [facility] Kathrine Haddock, NP   1 year ago Seasonal allergic rhinitis due to pollen   [place], MD      Future Appointments            In 36 months [facility], [facility]

## 2021-11-17 ENCOUNTER — Other Ambulatory Visit: Payer: Self-pay | Admitting: Family Medicine

## 2021-11-17 DIAGNOSIS — J301 Allergic rhinitis due to pollen: Secondary | ICD-10-CM

## 2021-11-19 ENCOUNTER — Other Ambulatory Visit: Payer: Self-pay | Admitting: Podiatry

## 2021-11-19 NOTE — Telephone Encounter (Signed)
Please advise 

## 2021-11-19 NOTE — Telephone Encounter (Signed)
Requested Prescriptions  ?Pending Prescriptions Disp Refills  ?? loratadine (CLARITIN) 10 MG tablet [Pharmacy Med Name: LORATADINE 10 MG TABLET] 90 tablet 3  ?  Sig: TAKE 1 TABLET BY MOUTH EVERY DAY  ?  ? Ear, Nose, and Throat:  Antihistamines 2 Passed - 11/17/2021 10:07 AM  ?  ?  Passed - Cr in normal range and within 360 days  ?  Creat  ?Date Value Ref Range Status  ?01/31/2021 0.80 0.50 - 0.99 mg/dL Final  ?  Comment:  ?  For patients >69 years of age, the reference limit ?for Creatinine is approximately 13% higher for people ?identified as African-American. ?. ?  ?   ?  ?  Passed - Valid encounter within last 12 months  ?  Recent Outpatient Visits   ?      ? 1 month ago Obesity (BMI 30.0-34.9)  ? [facility] Serafina Royals F, FNP  ? 1 month ago Viral upper respiratory tract infection  ? [place], FNP  ? 6 months ago Shortness of breath  ? [facility] Bo Merino, FNP  ? 9 months ago Obesity (BMI 30.0-34.9)  ? [facility] [place], Malachy Mood, NP  ? 1 year ago Seasonal allergic rhinitis due to pollen  ? [facility] Towanda Malkin, MD  ?  ?  ?Future Appointments   ?        ? In 8 months [place]   ?  ? ?  ?  ?  ? ? ?

## 2021-11-21 DIAGNOSIS — Z79899 Other long term (current) drug therapy: Secondary | ICD-10-CM | POA: Diagnosis not present

## 2021-11-21 DIAGNOSIS — R259 Unspecified abnormal involuntary movements: Secondary | ICD-10-CM | POA: Diagnosis not present

## 2021-11-21 DIAGNOSIS — M5442 Lumbago with sciatica, left side: Secondary | ICD-10-CM | POA: Diagnosis not present

## 2021-11-21 DIAGNOSIS — R519 Headache, unspecified: Secondary | ICD-10-CM | POA: Diagnosis not present

## 2021-12-02 ENCOUNTER — Other Ambulatory Visit: Payer: Self-pay | Admitting: Family Medicine

## 2021-12-03 NOTE — Telephone Encounter (Signed)
Requested Prescriptions  ?Pending Prescriptions Disp Refills  ?? valsartan (DIOVAN) 80 MG tablet [Pharmacy Med Name: VALSARTAN 80 MG TABLET] 90 tablet 0  ?  Sig: TAKE 1 TABLET BY MOUTH EVERY DAY  ?  ? Cardiovascular:  Angiotensin Receptor Blockers Failed - 12/02/2021  7:51 AM  ?  ?  Failed - Cr in normal range and within 180 days  ?  Creat  ?Date Value Ref Range Status  ?01/31/2021 0.80 0.50 - 0.99 mg/dL Final  ?  Comment:  ?  For patients >69 years of age, the reference limit ?for Creatinine is approximately 13% higher for people ?identified as African-American. ?. ?  ?   ?  ?  Failed - K in normal range and within 180 days  ?  Potassium  ?Date Value Ref Range Status  ?01/31/2021 4.5 3.5 - 5.3 mmol/L Final  ?05/25/2012 4.1 3.5 - 5.1 mmol/L Final  ?   ?  ?  Passed - Patient is not pregnant  ?  ?  Passed - Last BP in normal range  ?  BP Readings from Last 1 Encounters:  ?10/01/21 124/86  ?   ?  ?  Passed - Valid encounter within last 6 months  ?  Recent Outpatient Visits   ?      ? 2 months ago Obesity (BMI 30.0-34.9)  ? [facility] Serafina Royals F, FNP  ? 2 months ago Viral upper respiratory tract infection  ? [facility] Bo Merino, FNP  ? 7 months ago Shortness of breath  ? [facility] Bo Merino, FNP  ? 10 months ago Obesity (BMI 30.0-34.9)  ? [facility] [place], Malachy Mood, NP  ? 1 year ago Seasonal allergic rhinitis due to pollen  ? [facility] Towanda Malkin, MD  ?  ?  ?Future Appointments   ?        ? In 7 months [place]   ?  ? ?  ?  ?  ? ? ?

## 2021-12-11 DIAGNOSIS — M79644 Pain in right finger(s): Secondary | ICD-10-CM | POA: Diagnosis not present

## 2021-12-12 ENCOUNTER — Telehealth: Payer: Self-pay | Admitting: Nurse Practitioner

## 2021-12-12 NOTE — Telephone Encounter (Signed)
Pt called and stated that she it trying to schedule surgery for trigger finger but orthopedic office is stating that she has had a heart attack and fatty liver(stage 3 liver frailer) . Patient states that she has no idea what they are talking about. Pt would like a call back from the office as soon as possible.  ?

## 2021-12-13 NOTE — Telephone Encounter (Signed)
Pt. Returned call, given message, verbalizes understanding. States she has never had a heart attack. Was also told she has Stage 3 kidney disease. Would like to be advise don this as well. ?

## 2021-12-13 NOTE — Telephone Encounter (Signed)
Called patient and left message for her to call office

## 2021-12-13 NOTE — Telephone Encounter (Signed)
Please advise 

## 2021-12-16 ENCOUNTER — Other Ambulatory Visit: Payer: Self-pay | Admitting: Podiatry

## 2022-01-03 ENCOUNTER — Ambulatory Visit: Payer: Self-pay

## 2022-01-03 ENCOUNTER — Encounter: Payer: Self-pay | Admitting: Family Medicine

## 2022-01-03 ENCOUNTER — Ambulatory Visit (INDEPENDENT_AMBULATORY_CARE_PROVIDER_SITE_OTHER): Payer: Medicare HMO | Admitting: Family Medicine

## 2022-01-03 VITALS — BP 120/68 | HR 75 | Temp 98.3°F | Resp 16 | Ht 68.0 in | Wt 227.8 lb

## 2022-01-03 DIAGNOSIS — F3341 Major depressive disorder, recurrent, in partial remission: Secondary | ICD-10-CM

## 2022-01-03 DIAGNOSIS — F430 Acute stress reaction: Secondary | ICD-10-CM | POA: Diagnosis not present

## 2022-01-03 DIAGNOSIS — R69 Illness, unspecified: Secondary | ICD-10-CM | POA: Diagnosis not present

## 2022-01-03 DIAGNOSIS — E2839 Other primary ovarian failure: Secondary | ICD-10-CM

## 2022-01-03 DIAGNOSIS — Z13820 Encounter for screening for osteoporosis: Secondary | ICD-10-CM

## 2022-01-03 MED ORDER — HYDROXYZINE PAMOATE 25 MG PO CAPS: 25.0000 mg | ORAL_CAPSULE | Freq: Three times a day (TID) | ORAL | 1 refills | Status: DC | PRN

## 2022-01-03 MED ORDER — ESCITALOPRAM OXALATE 20 MG PO TABS: 20.0000 mg | ORAL_TABLET | Freq: Every day | ORAL | 1 refills | Status: DC

## 2022-01-03 NOTE — Telephone Encounter (Signed)
Summary: rx req / behavioral health  ? The patient is experiencing family hardship currently  ? ?The patient shares that their sister is currently experiencing health issues and the patient has been significantly stressed  ? ?The patient would like to be prescribed something for their nervousness  ? ?The patient has noticed an increase in their crying and stress  ? ?Please contact further when possible   ?  ?Called pt - LMOM to return call. ?

## 2022-01-03 NOTE — Progress Notes (Signed)
? ? ?Patient ID: Robin Bryan, female    DOB: [birth year], 71 y.o.   MRN: [id number] ? ?PCP: Bo Merino, FNP ? ?Chief Complaint  ?Patient presents with  ? Anxiety  ?  Due to family stress  ? ? ?Subjective:  ? ?Robin Bryan is a 71 y.o. female, presents to clinic with CC of the following: ? ?HPI  ? ?Pt with sisters declining health and she is currently transitioning to hospice  ?She is more anxious scared to fall asleep at nights often scared to watch her sister fall asleep she is afraid she is never going to wake up, she is afraid of every phone call and had been much more emotional lately.  Patient's sister was just discharged from the hospital and is going to hospice ? ? ? ? ?  01/03/2022  ?  3:07 PM 10/01/2021  ?  3:26 PM 09/24/2021  ? 11:50 AM  ?Depression screen PHQ 2/9  ?Decreased Interest 1 0 0  ?Down, Depressed, Hopeless 2 0 0  ?PHQ - 2 Score 3 0 0  ?Altered sleeping 0  0  ?Tired, decreased energy 2  0  ?Change in appetite 2  0  ?Feeling bad or failure about yourself  0  0  ?Trouble concentrating 0  0  ?Moving slowly or fidgety/restless 0  0  ?Suicidal thoughts 0  0  ?PHQ-9 Score 7  0  ?Difficult doing work/chores Not difficult at all  Not difficult at all  ? ? ?  01/03/2022  ?  3:15 PM 09/24/2021  ? 11:50 AM 01/31/2021  ? 10:09 AM  ?GAD 7 : Generalized Anxiety Score  ?Nervous, Anxious, on Edge 2 0 0  ?Control/stop worrying 1 0 0  ?Worry too much - different things 1 0 0  ?Trouble relaxing 1 0 0  ?Restless 0 0 0  ?Easily annoyed or irritable 1 0 0  ?Afraid - awful might happen 0 0 0  ?Total GAD 7 Score 6 0 0  ?Anxiety Difficulty Somewhat difficult Not difficult at all Not difficult at all  ? ? ? ? ? ?Patient Active Problem List  ? Diagnosis Date Noted  ? Dermatochalasis of eyelid 07/30/2021  ? Ptosis, both eyelids 07/30/2021  ? Visual field defect 07/30/2021  ? Seasonal allergic rhinitis due to pollen 01/24/2020  ? Prediabetes 01/24/2020  ? Mild intermittent asthma without complication 14/97/0263  ?  Cataract of both eyes 10/25/2019  ? Liver hemangioma 09/08/2018  ? Fatty liver 09/08/2018  ? Overactive bladder 03/19/2018  ? Obesity (BMI 30.0-34.9) 02/25/2018  ? Estrogen deficiency 01/08/2018  ? Postmenopausal 01/07/2018  ? Back pain 11/26/2017  ? Chronic kidney disease, stage III (moderate) ([place]) 11/26/2017  ? Calcaneal spur of both feet 11/26/2017  ? Arthritis of both feet 11/26/2017  ? Acute left-sided thoracic back pain 10/30/2015  ? Temporary cerebral vascular dysfunction 07/16/2015  ? Adaptation reaction 07/16/2015  ? Foot pain 07/16/2015  ? Acid reflux 07/16/2015  ? Abnormal LFTs 07/16/2015  ? Pericardial effusion 07/13/2015  ? Mass of chest wall, left 06/29/2015  ? Lipoma 06/29/2015  ? Pure hypercholesterolemia 04/28/2015  ? Gastro-esophageal reflux disease without esophagitis 04/28/2015  ? Benign essential HTN 04/28/2015  ? Hyperlipidemia 03/19/2015  ? Elevated hemoglobin A1c 03/19/2015  ? Neck muscle spasm 03/19/2015  ? Cognitive change 02/07/2015  ? Persistent dry cough 02/07/2015  ? Major depression in partial remission ([place]) 02/07/2015  ? Fibrositis 02/07/2015  ? Anxiety disorder 02/07/2015  ? History of anaphylaxis  02/07/2015  ? Pubic bone pain 10/02/2014  ? Mass of pelvis 06/05/2014  ? Ovarian cyst, left 05/26/2014  ? Abdominal pain, chronic, left lower quadrant 05/26/2014  ? CEREBROVASCULAR DISEASE 04/23/2010  ? COLONIC POLYPS, ADENOMATOUS 12/09/2006  ? ESOPHAGEAL STRICTURE 12/09/2006  ? GERD 12/09/2006  ? BARRETTS ESOPHAGUS 12/09/2006  ? DIVERTICULOSIS, COLON 12/09/2006  ? ? ? ? ?Current Outpatient Medications:  ?  albuterol (VENTOLIN HFA) 108 (90 Base) MCG/ACT inhaler, Inhale 2 puffs into the lungs every 6 (six) hours as needed for wheezing or shortness of breath., Disp: 6.7 g, Rfl: 3 ?  clopidogrel (PLAVIX) 75 MG tablet, TAKE 1 TABLET BY MOUTH EVERY DAY, Disp: 90 tablet, Rfl: 1 ?  diphenhydrAMINE (BENADRYL) 25 mg capsule, Take 1 capsule (25 mg total) by mouth every 8 (eight) hours as needed.,  Disp: 30 capsule, Rfl: 0 ?  escitalopram (LEXAPRO) 10 MG tablet, Take 1 tablet (10 mg total) by mouth daily., Disp: 90 tablet, Rfl: 3 ?  fluticasone (FLONASE) 50 MCG/ACT nasal spray, SPRAY 2 SPRAYS INTO EACH NOSTRIL EVERY DAY, Disp: 48 mL, Rfl: 1 ?  fluticasone-salmeterol (ADVAIR HFA) 45-21 MCG/ACT inhaler, Inhale 2 puffs into the lungs 2 (two) times daily., Disp: 1 each, Rfl: 12 ?  gabapentin (NEURONTIN) 400 MG capsule, Take 800 mg by mouth 4 (four) times daily. , Disp: , Rfl:  ?  guaiFENesin-dextromethorphan (ROBITUSSIN DM) 100-10 MG/5ML syrup, Take 10 mLs by mouth every 6 (six) hours as needed for cough., Disp: 118 mL, Rfl: 0 ?  hydrochlorothiazide (HYDRODIURIL) 12.5 MG tablet, TAKE 1 TABLET BY MOUTH EVERY DAY, Disp: 90 tablet, Rfl: 1 ?  loratadine (CLARITIN) 10 MG tablet, TAKE 1 TABLET BY MOUTH EVERY DAY, Disp: 90 tablet, Rfl: 3 ?  meloxicam (MOBIC) 15 MG tablet, Take 1 tablet by mouth daily., Disp: , Rfl:  ?  MYRBETRIQ 50 MG TB24 tablet, TK 1 T PO QD, Disp: , Rfl: 3 ?  oxyCODONE-acetaminophen (PERCOCET/ROXICET) 5-325 MG tablet, Take 1 tablet by mouth every 6 (six) hours as needed. for pain, Disp: , Rfl:  ?  pantoprazole (PROTONIX) 40 MG tablet, Office visit for further refills.  Take one tablet by mouth twice a day;, Disp: 180 tablet, Rfl: 0 ?  primidone (MYSOLINE) 50 MG tablet, Take 1 tablet by mouth daily., Disp: , Rfl: 6 ?  propranolol ER (INDERAL LA) 60 MG 24 hr capsule, Take 60 mg by mouth daily. , Disp: , Rfl:  ?  rosuvastatin (CRESTOR) 10 MG tablet, Take 1 tablet (10 mg total) by mouth daily. OFFICE VISIT NEEDED FOR ADDITIONAL REFILLS, Disp: 90 tablet, Rfl: 0 ?  tiZANidine (ZANAFLEX) 4 MG tablet, Take 4-8 mg by mouth at bedtime., Disp: , Rfl:  ?  valsartan (DIOVAN) 80 MG tablet, TAKE 1 TABLET BY MOUTH EVERY DAY, Disp: 90 tablet, Rfl: 0 ?  EPINEPHrine 0.3 mg/0.3 mL IJ SOAJ injection, Inject 0.3 mLs (0.3 mg total) into the muscle once for 1 dose., Disp: 0.3 mL, Rfl: 0 ? ? ?Allergies  ?Allergen Reactions  ?  Shellfish Allergy Swelling  ? Vicodin [Hydrocodone-Acetaminophen] Nausea Only  ? ? ? ?Social History  ? ?Tobacco Use  ? Smoking status: Former  ?  Packs/day: 0.50  ?  Years: 8.00  ?  Pack years: 4.00  ?  Types: Cigarettes  ?  Quit date: 05/27/2011  ?  Years since quitting: 10.6  ? Smokeless tobacco: Never  ? Tobacco comments:  ?  smoking cessation materials not required  ?Vaping Use  ? Vaping Use:  Never used  ?Substance Use Topics  ? Alcohol use: No  ? Drug use: No  ?  ? ? ?Chart Review Today: ?I personally reviewed active problem list, medication list, allergies, family history, social history, health maintenance, notes from last encounter, lab results, imaging with the patient/caregiver today. ? ? ?Review of Systems  ?Constitutional: Negative.   ?HENT: Negative.    ?Eyes: Negative.   ?Respiratory: Negative.    ?Cardiovascular: Negative.   ?Gastrointestinal: Negative.   ?Endocrine: Negative.   ?Genitourinary: Negative.   ?Musculoskeletal: Negative.   ?Skin: Negative.   ?Allergic/Immunologic: Negative.   ?Neurological: Negative.   ?Hematological: Negative.   ?Psychiatric/Behavioral: Negative.    ?All other systems reviewed and are negative. ? ?   ?Objective:  ? ?Vitals:  ? 01/03/22 1502  ?BP: 120/68  ?Pulse: 75  ?Resp: 16  ?Temp: 98.3 ?F (36.8 ?C)  ?SpO2: 95%  ?Weight: 227 lb 12.8 oz (103.3 kg)  ?Height: '5\' 8"'$  (1.727 m)  ?  ?Body mass index is 34.64 kg/m?. ? ?Physical Exam ?Vitals and nursing note reviewed.  ?Constitutional:   ?   General: She is not in acute distress. ?   Appearance: Normal appearance. She is well-developed. She is obese. She is not ill-appearing, toxic-appearing or diaphoretic.  ?HENT:  ?   Head: Normocephalic and atraumatic.  ?   Nose: Nose normal.  ?Eyes:  ?   General:     ?   Right eye: No discharge.     ?   Left eye: No discharge.  ?   Conjunctiva/sclera: Conjunctivae normal.  ?Neck:  ?   Trachea: No tracheal deviation.  ?Cardiovascular:  ?   Rate and Rhythm: Normal rate and regular rhythm.   ?Pulmonary:  ?   Effort: Pulmonary effort is normal. No respiratory distress.  ?   Breath sounds: No stridor.  ?Musculoskeletal:     ?   General: Normal range of motion.  ?Skin: ?   General: Skin is warm

## 2022-01-03 NOTE — Telephone Encounter (Signed)
?  Chief Complaint: anxiety feeling overwhelmed due to family dynamics  ?Symptoms: crying a lot, sister in hospital, patient overwhelmed at times  ?Frequency: 1 week ago  ?Pertinent Negatives: Patient denies chest pain, difficulty breathing, no heart palpitations no suicidal ideations ?Disposition: '[]'$ ED /'[]'$ Urgent Care (no appt availability in office) / '[x]'$ Appointment(In office/virtual)/ '[]'$  [place] [facility]/ '[]'$ Home Care/ '[]'$ Refused Recommended Disposition /'[]'$ [place] [facility]/ '[]'$  Follow-up with PCP ?Additional Notes:  ? ?Requesting medication . ? ? Reason for Disposition ? [1] Symptoms of anxiety or panic AND [2] has not been evaluated for this by physician ? ?Answer Assessment - Initial Assessment Questions ?1. CONCERN: "Did anything happen that prompted you to call today?"  ?    Sister in hospital  ?2. ANXIETY SYMPTOMS: "Can you describe how you (your loved one; patient) have been feeling?" (e.g., tense, restless, panicky, anxious, keyed up, overwhelmed, sense of impending doom).  ?    Sad , overwhelmed  ?3. ONSET: "How long have you been feeling this way?" (e.g., hours, days, weeks) ?    1 week  ?4. SEVERITY: "How would you rate the level of anxiety?" (e.g., 0 - 10; or mild, moderate, severe). ?    Crying all of the time difficult to control ?5. FUNCTIONAL IMPAIRMENT: "How have these feelings affected your ability to do daily activities?" "Have you had more difficulty than usual doing your normal daily activities?" (e.g., getting better, same, worse; self-care, school, work, interactions) ?    Yes  ?6. HISTORY: "Have you felt this way before?" "Have you ever been diagnosed with an anxiety problem in the past?" (e.g., generalized anxiety disorder, panic attacks, PTSD). If Yes, ask: "How was this problem treated?" (e.g., medicines, counseling, etc.) ?    Yes  not taking any medication in 5 years  ?7. RISK OF HARM - SUICIDAL IDEATION: "Do you ever have thoughts of hurting or killing yourself?" If Yes, ask:   "Do you have these feelings now?" "Do you have a plan on how you would do this?" ?    no ?8. TREATMENT:  "What has been done so far to treat this anxiety?" (e.g., medicines, relaxation strategies). "What has helped?" ?    Nothing  ?9. TREATMENT - THERAPIST: "Do you have a counselor or therapist? Name?" ?    na ?10. POTENTIAL TRIGGERS: "Do you drink caffeinated beverages (e.g., coffee, colas, teas), and how much daily?" "Do you drink alcohol or use any drugs?" "Have you started any new medicines recently?" ?    na ?10. PATIENT SUPPORT: "Who is with you now?" "Who do you live with?" "Do you have family or friends who you can talk to?"  ?      Some family support  ?11. OTHER SYMPTOMS: "Do you have any other symptoms?" (e.g., feeling depressed, trouble concentrating, trouble sleeping, trouble breathing, palpitations or fast heartbeat, chest pain, sweating, nausea, or diarrhea) ?      Sad  overwhelmed  ?12. PREGNANCY: "Is there any chance you are pregnant?" "When was your last menstrual period?" ?      na ? ?Protocols used: Anxiety and Panic Attack-A-AH ? ?

## 2022-01-03 NOTE — Telephone Encounter (Signed)
Summary: rx req / behavioral health  ? The patient is experiencing family hardship currently  ? ?The patient shares that their sister is currently experiencing health issues and the patient has been significantly stressed  ? ?The patient would like to be prescribed something for their nervousness  ? ?The patient has noticed an increase in their crying and stress  ? ?Please contact further when possible   ?  ?Called pt - LMOM ?

## 2022-01-06 ENCOUNTER — Telehealth: Payer: Self-pay | Admitting: *Deleted

## 2022-01-06 NOTE — Chronic Care Management (AMB) (Signed)
?  Chronic Care Management  ? ?Note ? ?01/06/2022 ?Name: [name] MRN: [id number] DOB: 02-26-1951 ? ?Robin Bryan is a 71 y.o. year old female who is a primary care patient of Bo Merino, FNP. I reached out to Trina Ao by phone today in response to a referral sent by [name]'s PCP. ? ?[name] was given information about Chronic Care Management services today including:  ?CCM service includes personalized support from designated clinical staff supervised by her physician, including individualized plan of care and coordination with other care providers ?24/7 contact phone numbers for assistance for urgent and routine care needs. ?Service will only be billed when office clinical staff spend 20 minutes or more in a month to coordinate care. ?Only one practitioner may furnish and bill the service in a calendar month. ?The patient may stop CCM services at any time (effective at the end of the month) by phone call to the office staff. ?The patient is responsible for co-pay (up to 20% after annual deductible is met) if co-pay is required by the individual health plan.  ? ?Patient agreed to services and verbal consent obtained.  ? ?Follow up plan: ?Telephone appointment with care management team member scheduled for: 02/14/2022 (pt requested June appointment with her schedule)  ? ?Jashad Depaula, CCMA ?Care Guide, Embedded Care Coordination ?[place]  Care Management  ?Direct Dial: [phone] ? ? ?

## 2022-01-10 ENCOUNTER — Other Ambulatory Visit: Payer: Self-pay | Admitting: Family Medicine

## 2022-01-10 DIAGNOSIS — I10 Essential (primary) hypertension: Secondary | ICD-10-CM

## 2022-01-10 NOTE — Telephone Encounter (Signed)
Requested medication (s) are due for refill today: yes ? ?Requested medication (s) are on the active medication list: yes ? ?Last refill:  07/23/21 #90 1 RF ? ?Future visit scheduled: yes ? ?Notes to clinic:  overdue lab work ? ? ?Requested Prescriptions  ?Pending Prescriptions Disp Refills  ? hydrochlorothiazide (HYDRODIURIL) 12.5 MG tablet [Pharmacy Med Name: HYDROCHLOROTHIAZIDE 12.5 MG TB] 90 tablet 1  ?  Sig: TAKE 1 TABLET BY MOUTH EVERY DAY  ?  ? Cardiovascular: Diuretics - Thiazide Failed - 01/10/2022  5:09 AM  ?  ?  Failed - Cr in normal range and within 180 days  ?  Creat  ?Date Value Ref Range Status  ?01/31/2021 0.80 0.50 - 0.99 mg/dL Final  ?  Comment:  ?  For patients >62 years of age, the reference limit ?for Creatinine is approximately 13% higher for people ?identified as African-American. ?. ?  ?   ?  ?  Failed - K in normal range and within 180 days  ?  Potassium  ?Date Value Ref Range Status  ?01/31/2021 4.5 3.5 - 5.3 mmol/L Final  ?05/25/2012 4.1 3.5 - 5.1 mmol/L Final  ?   ?  ?  Failed - Na in normal range and within 180 days  ?  Sodium  ?Date Value Ref Range Status  ?01/31/2021 143 135 - 146 mmol/L Final  ?04/28/2016 143 134 - 144 mmol/L Final  ?05/25/2012 140 136 - 145 mmol/L Final  ?   ?  ?  Passed - Last BP in normal range  ?  BP Readings from Last 1 Encounters:  ?01/03/22 120/68  ?   ?  ?  Passed - Valid encounter within last 6 months  ?  Recent Outpatient Visits   ? ?      ? 1 week ago Recurrent major depressive disorder, in partial remission ([place])  ? [facility], Kristeen Miss, PA-C  ? 3 months ago Obesity (BMI 30.0-34.9)  ? [facility] Serafina Royals F, FNP  ? 3 months ago Viral upper respiratory tract infection  ? [facility] Bo Merino, FNP  ? 8 months ago Shortness of breath  ? [facility] Bo Merino, FNP  ? 11 months ago Obesity (BMI 30.0-34.9)  ? [facility] Kathrine Haddock, NP   ? ?  ?  ?Future Appointments   ? ?        ? In 3 weeks Reece Packer, Myna Hidalgo, [facility], [place]  ? In 6 months  [place]  ? ?  ? ? ?  ?  ?  ? ? ? ? ?

## 2022-01-11 ENCOUNTER — Other Ambulatory Visit: Payer: Self-pay | Admitting: Podiatry

## 2022-01-18 ENCOUNTER — Other Ambulatory Visit: Payer: Self-pay | Admitting: Family Medicine

## 2022-01-18 DIAGNOSIS — E782 Mixed hyperlipidemia: Secondary | ICD-10-CM

## 2022-01-31 ENCOUNTER — Encounter: Payer: Self-pay | Admitting: Nurse Practitioner

## 2022-01-31 ENCOUNTER — Other Ambulatory Visit: Payer: Self-pay

## 2022-01-31 ENCOUNTER — Ambulatory Visit (INDEPENDENT_AMBULATORY_CARE_PROVIDER_SITE_OTHER): Payer: Medicare HMO | Admitting: Nurse Practitioner

## 2022-01-31 VITALS — BP 134/76 | HR 69 | Temp 98.3°F | Resp 14 | Ht 68.0 in | Wt 225.7 lb

## 2022-01-31 DIAGNOSIS — R69 Illness, unspecified: Secondary | ICD-10-CM | POA: Diagnosis not present

## 2022-01-31 DIAGNOSIS — F3341 Major depressive disorder, recurrent, in partial remission: Secondary | ICD-10-CM | POA: Diagnosis not present

## 2022-01-31 DIAGNOSIS — F430 Acute stress reaction: Secondary | ICD-10-CM

## 2022-01-31 DIAGNOSIS — G4709 Other insomnia: Secondary | ICD-10-CM

## 2022-01-31 MED ORDER — HYDROXYZINE PAMOATE 25 MG PO CAPS: 25.0000 mg | ORAL_CAPSULE | Freq: Three times a day (TID) | ORAL | 1 refills | Status: DC | PRN

## 2022-01-31 NOTE — Progress Notes (Signed)
BP 134/76   Pulse 69   Temp 98.3 F (36.8 C) (Oral)   Resp 14   Ht '5\' 8"'$  (1.727 m)   Wt 225 lb 11.2 oz (102.4 kg)   SpO2 98%   BMI 34.32 kg/m    Subjective:    Patient ID: Robin Bryan, female    DOB: Dec 19, 1950, 71 y.o.   MRN: [id number]  HPI: Robin Bryan is a 71 y.o. female  Chief Complaint  Patient presents with   Depression   Depression/ acute reaction to situational stress/insomnia: She was last seen on Jan 03, 2022, by Threasa Alpha, PA.  Patient was seen due to her sister's declining health and that she was transitioning to hospice care.  This caused the patient to have more anxious feelings she was scared to fall asleep at night.  She says she was afraid her sister would fall asleep and never wake up again.  She says she is afraid every time the phone rings.  She says this has been a very emotional experience.  Patient was started on hydroxyzine 25 mg 3 times a day as needed, she was also instructed to increase her Lexapro '10mg'$  to 20 mg daily.  She says her sister has since passed.  She says she is doing all right.  She says she has been having trouble sleeping.  Discussed taking the hydroxyzine at bedtime.  She is going to try that and let me know how that works for her.      01/31/2022    2:47 PM 01/03/2022    3:07 PM 10/01/2021    3:26 PM 09/24/2021   11:50 AM 07/23/2021    3:39 PM  Depression screen PHQ 2/9  Decreased Interest 0 1 0 0 0  Down, Depressed, Hopeless 0 2 0 0 0  PHQ - 2 Score 0 3 0 0 0  Altered sleeping 2 0  0   Tired, decreased energy 0 2  0   Change in appetite 0 2  0   Feeling bad or failure about yourself  0 0  0   Trouble concentrating 0 0  0   Moving slowly or fidgety/restless 0 0  0   Suicidal thoughts  0  0   PHQ-9 Score 2 7  0   Difficult doing work/chores  Not difficult at all  Not difficult at all     Relevant past medical, surgical, family and social history reviewed and updated as indicated. Interim medical history since our last  visit reviewed. Allergies and medications reviewed and updated.  Review of Systems  Constitutional: Negative for fever or weight change.  Respiratory: Negative for cough and shortness of breath.   Cardiovascular: Negative for chest pain or palpitations.  Gastrointestinal: Negative for abdominal pain, no bowel changes.  Musculoskeletal: Negative for gait problem or joint swelling.  Skin: Negative for rash.  Neurological: Negative for dizziness or headache.  No other specific complaints in a complete review of systems (except as listed in HPI above).    Objective:    BP 134/76   Pulse 69   Temp 98.3 F (36.8 C) (Oral)   Resp 14   Ht '5\' 8"'$  (1.727 m)   Wt 225 lb 11.2 oz (102.4 kg)   SpO2 98%   BMI 34.32 kg/m   Wt Readings from Last 3 Encounters:  01/31/22 225 lb 11.2 oz (102.4 kg)  01/03/22 227 lb 12.8 oz (103.3 kg)  10/01/21 229 lb 6.4 oz (104.1 kg)  Physical Exam  Constitutional: Patient appears well-developed and well-nourished. Obese  No distress.  HEENT: head atraumatic, normocephalic, pupils equal and reactive to light, neck supple, throat within normal limits Cardiovascular: Normal rate, regular rhythm and normal heart sounds.  No murmur heard. No BLE edema. Pulmonary/Chest: Effort normal and breath sounds normal. No respiratory distress. Abdominal: Soft.  There is no tenderness. Psychiatric: Patient has a normal mood and affect. behavior is normal. Judgment and thought content normal.  Results for orders placed or performed in visit on 09/24/21  Novel Coronavirus, NAA ([facility])   Specimen: Nasopharyngeal(NP) swabs in vial transport medium   Nasopharynge  Previous  Result Value Ref Range   SARS-CoV-2, NAA Not Detected Not Detected  SARS-COV-2, NAA 2 DAY TAT   Nasopharynge  Previous  Result Value Ref Range   SARS-CoV-2, NAA 2 DAY TAT Performed   Specimen status report  Result Value Ref Range   specimen status report Comment   POCT Influenza A/B  Result Value  Ref Range   Influenza A, POC Negative Negative   Influenza B, POC Negative Negative      Assessment & Plan:   Problem List Items Addressed This Visit       Other   Major depression in partial remission ([place]) - Primary    Continue taking Lexapro 20 mg daily.  Take hydroxyzine as needed.       Relevant Medications   hydrOXYzine (VISTARIL) 25 MG capsule   Other Visit Diagnoses     Acute reaction to situational stress       Continue taking hydroxyzine as needed.  Taking Lexapro 20 mg.   Relevant Medications   hydrOXYzine (VISTARIL) 25 MG capsule   Other insomnia       Try taking hydroxyzine at bedtime.        Follow up plan: Return if symptoms worsen or fail to improve.

## 2022-01-31 NOTE — Assessment & Plan Note (Signed)
Continue taking Lexapro 20 mg daily.  Take hydroxyzine as needed.

## 2022-02-14 ENCOUNTER — Ambulatory Visit: Payer: Medicare HMO | Admitting: *Deleted

## 2022-02-14 ENCOUNTER — Telehealth: Payer: Self-pay | Admitting: *Deleted

## 2022-02-14 DIAGNOSIS — F3341 Major depressive disorder, recurrent, in partial remission: Secondary | ICD-10-CM

## 2022-02-14 DIAGNOSIS — F430 Acute stress reaction: Secondary | ICD-10-CM

## 2022-02-14 NOTE — Chronic Care Management (AMB) (Signed)
Assessment: Assessed patient's previous and current treatment, coping skills, support system and barriers to care.. No Care Plan was developed during this encounter.  Recent life changes [place]: Patient's sister passed a way approximately 3 weeks ago. This social worker consulted to assess for community resources/mental health needs. Patient provided with emotional support related to the loss of her sister. Patient's states that  despite the circumstances she is doing well. Patient describes a strong faith and perspective regarding her sister's death. Grief counseling offered, patient declined this option at this time.  Interventions:Active listening / Reflection utilized  Emotional Support Provided  Review various resources, discussed options and provided patient information about  Grief Counseling  Follow up Plan: No clinical needs were identified during this encounter. Patient has declined need for grief counseling and verbalized having no additional community resource needs at this time. Patient to contact her provider's office if clinical needs are identified in the future.   Elliot Gurney, [place] Administrator, [facility] Center/[facility] Care Management [phone]

## 2022-02-14 NOTE — Telephone Encounter (Signed)
Pt returned call, tried dialing number listed below. requesting call back

## 2022-02-14 NOTE — Telephone Encounter (Signed)
  Care Management   Follow Up Note   02/14/2022 Name: Robin Bryan MRN: [id number] DOB: February 12, 1951   Referred by: Bo Merino, FNP Reason for referral : Care Coordination   An unsuccessful telephone outreach was attempted today. The patient was referred to the case management team for assistance with care management and care coordination.   Follow Up Plan: Telephone follow up appointment with care management team member to be re-scheduled by careguide.  Elliot Gurney, [place] Administrator, [facility] Center[facility] Care Management [phone]

## 2022-02-17 ENCOUNTER — Telehealth: Payer: Self-pay

## 2022-02-17 ENCOUNTER — Telehealth: Payer: Self-pay | Admitting: Nurse Practitioner

## 2022-02-17 NOTE — Telephone Encounter (Signed)
Called pt and she is not going to do the surgery at this time   Copied from [place] [id number]. Topic: General - Other >> Feb 17, 2022  8:43 AM Robin Bryan wrote: Reason for CRM:  Pt keeps getting a call from office about her surgery / pt stated she is cancelling the surgery and is not having it done by that particular provider/ please advise

## 2022-02-17 NOTE — Telephone Encounter (Signed)
Cancelling surgery with Dr.Wagner at [facility] for [place]. Going out of town for 1 month and will reschedule when she return. Would like to have another provider. Will discuss when returning

## 2022-02-17 NOTE — Telephone Encounter (Signed)
Copied from [place] [id number]. Topic: General - Other >> Feb 14, 2022  3:58 PM Charlotte Sanes J wrote: Reason for CRM: Pt keeps getting a call from office about her surgery / pt stated she is cancelling the surgery and is not having it done by that particular provider/ please advise

## 2022-02-19 DIAGNOSIS — M25532 Pain in left wrist: Secondary | ICD-10-CM | POA: Diagnosis not present

## 2022-02-19 DIAGNOSIS — M25561 Pain in right knee: Secondary | ICD-10-CM | POA: Diagnosis not present

## 2022-02-21 ENCOUNTER — Other Ambulatory Visit: Payer: Self-pay | Admitting: Nurse Practitioner

## 2022-02-24 ENCOUNTER — Other Ambulatory Visit: Payer: Self-pay

## 2022-02-24 MED ORDER — VALSARTAN 80 MG PO TABS: 80.0000 mg | ORAL_TABLET | Freq: Every day | ORAL | 0 refills | Status: DC

## 2022-03-05 ENCOUNTER — Other Ambulatory Visit: Payer: Self-pay | Admitting: Emergency Medicine

## 2022-03-05 MED ORDER — CLOPIDOGREL BISULFATE 75 MG PO TABS: 75.0000 mg | ORAL_TABLET | Freq: Every day | ORAL | 1 refills | Status: DC

## 2022-03-20 ENCOUNTER — Encounter: Payer: Self-pay | Admitting: Internal Medicine

## 2022-04-01 ENCOUNTER — Other Ambulatory Visit: Payer: Self-pay | Admitting: Internal Medicine

## 2022-04-01 ENCOUNTER — Other Ambulatory Visit: Payer: Self-pay | Admitting: Nurse Practitioner

## 2022-04-01 DIAGNOSIS — E782 Mixed hyperlipidemia: Secondary | ICD-10-CM

## 2022-04-02 NOTE — Telephone Encounter (Signed)
Requested medications are due for refill today.  yes  Requested medications are on the active medications list.  yes  Last refill. 01/20/2022 #90 0 refills  Future visit scheduled.   yes  Notes to clinic.  Labs are expired.    Requested Prescriptions  Pending Prescriptions Disp Refills   rosuvastatin (CRESTOR) 10 MG tablet [Pharmacy Med Name: ROSUVASTATIN CALCIUM 10 MG TAB] 90 tablet 0    Sig: TAKE 1 TABLET BY MOUTH EVERY DAY     Cardiovascular:  Antilipid - Statins 2 Failed - 04/01/2022  4:32 PM      Failed - Cr in normal range and within 360 days    Creat  Date Value Ref Range Status  01/31/2021 0.80 0.50 - 0.99 mg/dL Final    Comment:    For patients >28 years of age, the reference limit for Creatinine is approximately 13% higher for people identified as African-American. .          Failed - Lipid Panel in normal range within the last 12 months    Cholesterol, Total  Date Value Ref Range Status  04/28/2016 127 100 - 199 mg/dL Final   Cholesterol  Date Value Ref Range Status  01/31/2021 113 <200 mg/dL Final   LDL Cholesterol (Calc)  Date Value Ref Range Status  01/31/2021 49 mg/dL (calc) Final    Comment:    Reference range: <100 . Desirable range <100 mg/dL for primary prevention;   <70 mg/dL for patients with CHD or diabetic patients  with > or = 2 CHD risk factors. Marland Kitchen LDL-C is now calculated using the Martin-Hopkins  calculation, which is a validated novel method providing  better accuracy than the Friedewald equation in the  estimation of LDL-C.  Cresenciano Genre et al. Annamaria Helling. 9357;017(79): 2061-2068  (http://education.[facility].com/faq/FAQ164)    HDL  Date Value Ref Range Status  01/31/2021 31 (L) > OR = 50 mg/dL Final  04/28/2016 37 (L) >39 mg/dL Final   Triglycerides  Date Value Ref Range Status  01/31/2021 314 (H) <150 mg/dL Final    Comment:    . If a non-fasting specimen was collected, consider repeat triglyceride testing on a fasting  specimen if clinically indicated.  Yates Decamp et al. J. of Clin. Lipidol. 3903;0:092-330. Marland Kitchen          Passed - Patient is not pregnant      Passed - Valid encounter within last 12 months    Recent Outpatient Visits           2 months ago Recurrent major depressive disorder, in partial remission [facility])   [facility] Serafina Royals F, FNP   2 months ago Recurrent major depressive disorder, in partial remission [facility])   [facility] [place], Kristeen Miss, PA-C   6 months ago Obesity (BMI 30.0-34.9)   [facility] Bo Merino, FNP   6 months ago Viral upper respiratory tract infection   [place], FNP   11 months ago Shortness of breath   [facility] Bo Merino, FNP       Future Appointments             In 3 months [facility], [facility]

## 2022-04-02 NOTE — Telephone Encounter (Signed)
Patient needs an appointment for future refills. 

## 2022-04-10 DIAGNOSIS — M5442 Lumbago with sciatica, left side: Secondary | ICD-10-CM | POA: Diagnosis not present

## 2022-04-10 DIAGNOSIS — R519 Headache, unspecified: Secondary | ICD-10-CM | POA: Diagnosis not present

## 2022-04-10 DIAGNOSIS — R259 Unspecified abnormal involuntary movements: Secondary | ICD-10-CM | POA: Diagnosis not present

## 2022-04-10 DIAGNOSIS — M542 Cervicalgia: Secondary | ICD-10-CM | POA: Diagnosis not present

## 2022-04-11 ENCOUNTER — Other Ambulatory Visit: Payer: Self-pay | Admitting: Nurse Practitioner

## 2022-04-11 NOTE — Telephone Encounter (Signed)
Requested medication (s) are due for refill today: no  Requested medication (s) are on the active medication list: yes  Last refill:  02/24/22 #90 0 refill  Future visit scheduled: yes  Notes to clinic:  protocol failed. Last labs 01/31/21. Do you want to refill Rx?     Requested Prescriptions  Pending Prescriptions Disp Refills   valsartan (DIOVAN) 80 MG tablet [Pharmacy Med Name: VALSARTAN 80 MG TABLET] 90 tablet 0    Sig: TAKE 1 TABLET BY MOUTH EVERY DAY     Cardiovascular:  Angiotensin Receptor Blockers Failed - 04/11/2022  2:18 PM      Failed - Cr in normal range and within 180 days    Creat  Date Value Ref Range Status  01/31/2021 0.80 0.50 - 0.99 mg/dL Final    Comment:    For patients >74 years of age, the reference limit for Creatinine is approximately 13% higher for people identified as African-American. .          Failed - K in normal range and within 180 days    Potassium  Date Value Ref Range Status  01/31/2021 4.5 3.5 - 5.3 mmol/L Final  05/25/2012 4.1 3.5 - 5.1 mmol/L Final         Passed - Patient is not pregnant      Passed - Last BP in normal range    BP Readings from Last 1 Encounters:  01/31/22 134/76         Passed - Valid encounter within last 6 months    Recent Outpatient Visits           2 months ago Recurrent major depressive disorder, in partial remission [facility])   [facility] Serafina Royals F, FNP   3 months ago Recurrent major depressive disorder, in partial remission [facility])   [facility] [place], Kristeen Miss, PA-C   6 months ago Obesity (BMI 30.0-34.9)   [facility] Bo Merino, FNP   6 months ago Viral upper respiratory tract infection   [place], FNP   11 months ago Shortness of breath   [facility] Bo Merino, FNP       Future Appointments             In 3 months [facility], [facility]

## 2022-04-15 ENCOUNTER — Other Ambulatory Visit: Payer: Self-pay | Admitting: Nurse Practitioner

## 2022-04-15 ENCOUNTER — Other Ambulatory Visit: Payer: Self-pay | Admitting: Internal Medicine

## 2022-04-15 DIAGNOSIS — E782 Mixed hyperlipidemia: Secondary | ICD-10-CM

## 2022-04-15 DIAGNOSIS — I10 Essential (primary) hypertension: Secondary | ICD-10-CM

## 2022-04-15 NOTE — Telephone Encounter (Signed)
Requested medication (s) are due for refill today: no  Requested medication (s) are on the active medication list: yes  Last refill:  01/13/22 and 04/04/22  Future visit scheduled: yes  Notes to clinic:  Unable to refill per protocol, Rx request is too soon. Last refill for hyrodiuril 01/13/22 for 90 and 1 refill. Crestor last refill was 04/04/22 for 90.     Requested Prescriptions  Pending Prescriptions Disp Refills   hydrochlorothiazide (HYDRODIURIL) 12.5 MG tablet [Pharmacy Med Name: HYDROCHLOROTHIAZIDE 12.5 MG TB] 90 tablet 1    Sig: TAKE 1 TABLET BY MOUTH EVERY DAY     Cardiovascular: Diuretics - Thiazide Failed - 04/15/2022 10:55 AM      Failed - Cr in normal range and within 180 days    Creat  Date Value Ref Range Status  01/31/2021 0.80 0.50 - 0.99 mg/dL Final    Comment:    For patients >25 years of age, the reference limit for Creatinine is approximately 13% higher for people identified as African-American. .          Failed - K in normal range and within 180 days    Potassium  Date Value Ref Range Status  01/31/2021 4.5 3.5 - 5.3 mmol/L Final  05/25/2012 4.1 3.5 - 5.1 mmol/L Final         Failed - Na in normal range and within 180 days    Sodium  Date Value Ref Range Status  01/31/2021 143 135 - 146 mmol/L Final  04/28/2016 143 134 - 144 mmol/L Final  05/25/2012 140 136 - 145 mmol/L Final         Passed - Last BP in normal range    BP Readings from Last 1 Encounters:  01/31/22 134/76         Passed - Valid encounter within last 6 months    Recent Outpatient Visits           2 months ago Recurrent major depressive disorder, in partial remission [facility])   [facility] Bo Merino, FNP   3 months ago Recurrent major depressive disorder, in partial remission [facility])   [facility] [place], Kristeen Miss, PA-C   6 months ago Obesity (BMI 30.0-34.9)   [facility] Bo Merino, FNP   6 months ago Viral upper  respiratory tract infection   [facility] Bo Merino, FNP   11 months ago Shortness of breath   [facility] Serafina Royals F, [place]       Future Appointments             In 3 months [facility], [facility]              rosuvastatin (CRESTOR) 10 MG tablet [Pharmacy Med Name: ROSUVASTATIN CALCIUM 10 MG TAB] 90 tablet 0    Sig: TAKE 1 TABLET BY MOUTH EVERY DAY     Cardiovascular:  Antilipid - Statins 2 Failed - 04/15/2022 10:55 AM      Failed - Cr in normal range and within 360 days    Creat  Date Value Ref Range Status  01/31/2021 0.80 0.50 - 0.99 mg/dL Final    Comment:    For patients >72 years of age, the reference limit for Creatinine is approximately 13% higher for people identified as African-American. .          Failed - Lipid Panel in normal range within the last 12 months  Cholesterol, Total  Date Value Ref Range Status  04/28/2016 127 100 - 199 mg/dL Final   Cholesterol  Date Value Ref Range Status  01/31/2021 113 <200 mg/dL Final   LDL Cholesterol (Calc)  Date Value Ref Range Status  01/31/2021 49 mg/dL (calc) Final    Comment:    Reference range: <100 . Desirable range <100 mg/dL for primary prevention;   <70 mg/dL for patients with CHD or diabetic patients  with > or = 2 CHD risk factors. Marland Kitchen LDL-C is now calculated using the Martin-Hopkins  calculation, which is a validated novel method providing  better accuracy than the Friedewald equation in the  estimation of LDL-C.  Cresenciano Genre et al. Annamaria Helling. 8032;122(48): 2061-2068  (http://education.[facility].com/faq/FAQ164)    HDL  Date Value Ref Range Status  01/31/2021 31 (L) > OR = 50 mg/dL Final  04/28/2016 37 (L) >39 mg/dL Final   Triglycerides  Date Value Ref Range Status  01/31/2021 314 (H) <150 mg/dL Final    Comment:    . If a non-fasting specimen was collected, consider repeat triglyceride testing on a fasting specimen if  clinically indicated.  Yates Decamp et al. J. of Clin. Lipidol. 2500;3:704-888. Marland Kitchen          Passed - Patient is not pregnant      Passed - Valid encounter within last 12 months    Recent Outpatient Visits           2 months ago Recurrent major depressive disorder, in partial remission [facility])   [facility] Serafina Royals F, FNP   3 months ago Recurrent major depressive disorder, in partial remission [facility])   [facility] [place], Kristeen Miss, PA-C   6 months ago Obesity (BMI 30.0-34.9)   [facility] Bo Merino, FNP   6 months ago Viral upper respiratory tract infection   [place], FNP   11 months ago Shortness of breath   [facility] Bo Merino, FNP       Future Appointments             In 3 months [facility], [facility]

## 2022-04-16 ENCOUNTER — Telehealth: Payer: Self-pay | Admitting: Internal Medicine

## 2022-04-16 MED ORDER — PANTOPRAZOLE SODIUM 40 MG PO TBEC: DELAYED_RELEASE_TABLET | ORAL | 0 refills | Status: DC

## 2022-04-16 NOTE — Telephone Encounter (Signed)
Pantoprazole refilled. 

## 2022-04-16 NOTE — Telephone Encounter (Signed)
Inbound call from patient stating that she is in need of a refill for PROTONIX. Patient is scheduled to see Nevin Bloodgood on 8/25 at 10:30. Please advise.

## 2022-04-21 ENCOUNTER — Encounter: Payer: Self-pay | Admitting: *Deleted

## 2022-04-23 DIAGNOSIS — H04123 Dry eye syndrome of bilateral lacrimal glands: Secondary | ICD-10-CM | POA: Diagnosis not present

## 2022-04-23 DIAGNOSIS — H26493 Other secondary cataract, bilateral: Secondary | ICD-10-CM | POA: Diagnosis not present

## 2022-04-23 DIAGNOSIS — H02834 Dermatochalasis of left upper eyelid: Secondary | ICD-10-CM | POA: Diagnosis not present

## 2022-04-23 DIAGNOSIS — H02831 Dermatochalasis of right upper eyelid: Secondary | ICD-10-CM | POA: Diagnosis not present

## 2022-04-24 DIAGNOSIS — N3281 Overactive bladder: Secondary | ICD-10-CM | POA: Diagnosis not present

## 2022-04-25 ENCOUNTER — Encounter: Payer: Self-pay | Admitting: Nurse Practitioner

## 2022-04-25 ENCOUNTER — Telehealth: Payer: Self-pay | Admitting: *Deleted

## 2022-04-25 ENCOUNTER — Ambulatory Visit: Payer: Medicare HMO | Admitting: Nurse Practitioner

## 2022-04-25 VITALS — BP 118/68 | HR 86 | Ht 69.0 in | Wt 221.0 lb

## 2022-04-25 DIAGNOSIS — K2270 Barrett's esophagus without dysplasia: Secondary | ICD-10-CM | POA: Diagnosis not present

## 2022-04-25 DIAGNOSIS — Z86010 Personal history of colonic polyps: Secondary | ICD-10-CM

## 2022-04-25 DIAGNOSIS — K219 Gastro-esophageal reflux disease without esophagitis: Secondary | ICD-10-CM

## 2022-04-25 MED ORDER — PLENVU 140 G PO SOLR: 1.0000 | ORAL | 0 refills | Status: DC

## 2022-04-25 NOTE — Progress Notes (Signed)
Assessment and plan noted ?

## 2022-04-25 NOTE — Patient Instructions (Signed)
You have been scheduled for an endoscopy and colonoscopy. Please follow the written instructions given to you at your visit today. Please pick up your prep supplies at the pharmacy within the next 1-3 days. If you use inhalers (even only as needed), please bring them with you on the day of your procedure.  _______________________________________________________  If you are age 69 or older, your body mass index should be between 23-30. Your Body mass index is 32.64 kg/m. If this is out of the aforementioned range listed, please consider follow up with your Primary Care Provider.  If you are age 2 or younger, your body mass index should be between 19-25. Your Body mass index is 32.64 kg/m. If this is out of the aformentioned range listed, please consider follow up with your Primary Care Provider.   ________________________________________________________  The [facility] providers would like to encourage you to use [facility] to communicate with providers for non-urgent requests or questions.  Due to long hold times on the telephone, sending your provider a message by [facility] may be a faster and more efficient way to get a response.  Please allow 48 business hours for a response.  Please remember that this is for non-urgent requests.  _______________________________________________________  Due to recent changes in healthcare laws, you may see the results of your imaging and laboratory studies on MyChart before your provider has had a chance to review them.  We understand that in some cases there may be results that are confusing or concerning to you. Not all laboratory results come back in the same time frame and the provider may be waiting for multiple results in order to interpret others.  Please give [place] 48 hours in order for your provider to thoroughly review all the results before contacting the office for clarification of your results.

## 2022-04-25 NOTE — Telephone Encounter (Signed)
   Robin Bryan Sep 24, 1950 [id number]  Dear [name]:  We have scheduled the above named patient for a(n) endoscopy/colonoscopy procedure. Our records show that (s)he is on anticoagulation therapy.  Please advise as to whether the patient may come off their therapy of Plavix 5 days prior to their procedure which is scheduled for 05/16/22.  Please route your response to Dixon Boos, [place]  Sincerely,    [facility]

## 2022-04-25 NOTE — Telephone Encounter (Signed)
I have spoken to patient to advise that per Serafina Royals, FNP, she may hold plavix 5 days prior to her upcoming procedure. Patient verbalizes understanding of this information.

## 2022-04-25 NOTE — Telephone Encounter (Signed)
===  View-only below this line=== ----- Message ----- From: Bo Merino, FNP Sent: 04/25/2022  11:35 AM EDT To: Larina Bras, CMA  Yes she may hold her plavix for five days prior to the procedure.  ----- Message -----

## 2022-04-25 NOTE — Progress Notes (Signed)
Chief Complaint:  history of colon polyps   Assessment &  Plan   # 71 yo female with history of recurrent adenomatous colon polyps ( 2005, 2008, 2013, 2018). Due for 5 year interval colonoscopy ( received letter).  Schedule for a colonoscopy. The risks and benefits of colonoscopy with possible polypectomy / biopsies were discussed and the patient agrees to proceed.    # GERD complicated by esophageal stricture and Barrett's esophagus (1.5 cm on last EGD in 2016). No dysplasia on biopsies on 2016. Recommendation at that time was for surveillance EGD in 3 years but not done.  Schedule for surveillance EGD. The risks and benefits of EGD with possible biopsies were discussed with the patient who agrees to proceed.   Remote history of CVA, on plavix Hold Plavix for 5 days before procedures - will instruct when and how to resume after procedure. Patient understands that there is a low but real risk of cardiovascular event such as heart attack, stroke, or embolism /  thrombosis, or ischemia while off Plavix. The patient consents to proceed. Will communicate by phone or EMR with patient's prescribing provider to confirm that holding Plavix is reasonable in this case.    HPI   Robin Bryan is a 71 y.o. female known to Dr. Henrene Pastor with a past medical history significant for fibromyalgia, tremors, hypertension, CVA, depression, GERD complicated by peptic stricture requiring esophageal dilation and Barrett's esophagus , adenomatous colon polyps , and fecal incontinence, hepatic hemangioma . See [place] /PSH for additional history    Interval History:  Robin Bryan is here to get scheduled for colonoscopy. She received a letter asking her to make an appt to discuss. She is on plavix for a history of CVA. No blood in stool. No bowel changes. No [place] of colon cancer.   Previous GI Evaluation  July 2018 surveillance colonoscopy - One 3 mm polyp in the descending colon, removed with a cold snare. Resected and  retrieved. - Diverticulosis in the left colon. - Internal hemorrhoids. - The entire examined colon is otherwise normal on direct and retroflexion views  July 2018 Surveillance EGD - Benign-appearing esophageal stenosis. Dilated. - Esophageal mucosal changes secondary to established short-segment Barrett's disease. Biopsied. - Normal stomach. - Normal examined duodenum.  Surgical [P], random sites - BENIGN COLONIC MUCOSA. - NO ACTIVE INFLAMMATION OR EVIDENCE OF MICROSCOPIC COLITIS. - NO DYSPLASIA OR MALIGNANCY. 2. Surgical [P], descending, polyp - TUBULAR ADENOMA. - NO HIGH GRADE DYSPLASIA OR MALIGNANCY. 3. Surgical [P], short segment of Barrett's - INTESTINAL METAPLASIA (GOBLET CELL METAPLASIA) CONSISTENT WITH BARRETT'S ESOPHAGUS. - NO DYSPLASIA OR MALIGNANCY.   Labs:     Latest Ref Rng & Units 01/31/2021   10:42 AM 01/31/2020   11:05 AM 11/26/2017   12:01 PM  CBC  WBC 3.8 - 10.8 Thousand/uL 6.5  6.3  6.6   Hemoglobin 11.7 - 15.5 g/dL 12.8  13.8  13.4   Hematocrit 35.0 - 45.0 % 37.8  40.4  38.9   Platelets 140 - 400 Thousand/uL 256  233  337        Latest Ref Rng & Units 01/31/2021   10:42 AM 01/31/2020   11:05 AM 08/31/2018    9:06 AM  Hepatic Function  Total Protein 6.1 - 8.1 g/dL 6.2  6.3  6.5   AST 10 - 35 U/L '12  15  10   '$ ALT 6 - 29 U/L '15  17  14   '$ Total Bilirubin 0.2 - 1.2 mg/dL  0.4  0.5  0.4      Past Medical History:  Diagnosis Date   Allergy    Anxiety    Arthritis    Arthritis of both feet 11/26/2017   xrays March 2019   Asthma    Barrett's esophagus    Bronchitis    Calcaneal spur of both feet 11/26/2017   Foot xrays March 2019   Depression    Diverticulosis    Esophageal stricture    Fatty liver 09/08/2018   [place] Jan 2020   Fibromyalgia    GERD (gastroesophageal reflux disease)    Hx of adenomatous colonic polyps    Hyperlipidemia    Hypertension    Internal hemorrhoids    Intramural leiomyoma of uterus    Liver hemangioma 09/08/2018   [place]  Jan 2020   Major depressive disorder    Myocardial infarction [facility])    Stroke [facility])     Past Surgical History:  Procedure Laterality Date   ABDOMINAL HYSTERECTOMY     CATARACT EXTRACTION Bilateral 03/2019   INCONTINENCE SURGERY     TUBAL LIGATION     tumor excision ovaries      Current Medications, Allergies, Family History and Social History were reviewed in [place] record.     Current Outpatient Medications  Medication Sig Dispense Refill   albuterol (VENTOLIN HFA) 108 (90 Base) MCG/ACT inhaler Inhale 2 puffs into the lungs every 6 (six) hours as needed for wheezing or shortness of breath. 6.7 g 3   clopidogrel (PLAVIX) 75 MG tablet Take 1 tablet (75 mg total) by mouth daily. 90 tablet 1   escitalopram (LEXAPRO) 20 MG tablet Take 1 tablet (20 mg total) by mouth daily. 90 tablet 1   fluticasone (FLONASE) 50 MCG/ACT nasal spray SPRAY 2 SPRAYS INTO EACH NOSTRIL EVERY DAY 48 mL 1   fluticasone-salmeterol (ADVAIR HFA) 45-21 MCG/ACT inhaler Inhale 2 puffs into the lungs 2 (two) times daily. 1 each 12   gabapentin (NEURONTIN) 400 MG capsule Take 800 mg by mouth 4 (four) times daily.      guaiFENesin-dextromethorphan (ROBITUSSIN DM) 100-10 MG/5ML syrup Take 10 mLs by mouth every 6 (six) hours as needed for cough. 118 mL 0   hydrochlorothiazide (HYDRODIURIL) 12.5 MG tablet TAKE 1 TABLET BY MOUTH EVERY DAY 90 tablet 1   hydrOXYzine (VISTARIL) 25 MG capsule Take 1 capsule (25 mg total) by mouth 3 (three) times daily as needed for anxiety (insomnia). 60 capsule 1   loratadine (CLARITIN) 10 MG tablet TAKE 1 TABLET BY MOUTH EVERY DAY 90 tablet 3   meloxicam (MOBIC) 15 MG tablet TAKE 1 TABLET (15 MG TOTAL) BY MOUTH DAILY. 30 tablet 3   MYRBETRIQ 50 MG TB24 tablet TK 1 T PO QD  3   oxyCODONE-acetaminophen (PERCOCET/ROXICET) 5-325 MG tablet Take 1 tablet by mouth every 6 (six) hours as needed. for pain     pantoprazole (PROTONIX) 40 MG tablet Office visit for further  refills.  Take one tablet by mouth twice a day; 180 tablet 0   primidone (MYSOLINE) 50 MG tablet Take 1 tablet by mouth daily.  6   propranolol ER (INDERAL LA) 60 MG 24 hr capsule Take 60 mg by mouth daily.      rosuvastatin (CRESTOR) 10 MG tablet TAKE 1 TABLET BY MOUTH EVERY DAY 90 tablet 0   tiZANidine (ZANAFLEX) 4 MG tablet Take 4-8 mg by mouth at bedtime.     valsartan (DIOVAN) 80 MG tablet TAKE 1 TABLET  BY MOUTH EVERY DAY 90 tablet 0   EPINEPHrine 0.3 mg/0.3 mL IJ SOAJ injection Inject 0.3 mLs (0.3 mg total) into the muscle once for 1 dose. 0.3 mL 0   No current facility-administered medications for this visit.    Review of Systems: No chest pain. No shortness of breath. No urinary complaints.    Physical Exam  Wt Readings from Last 3 Encounters:  04/25/22 221 lb (100.2 kg)  01/31/22 225 lb 11.2 oz (102.4 kg)  01/03/22 227 lb 12.8 oz (103.3 kg)    BP 118/68   Pulse 86   Ht '5\' 9"'$  (1.753 m)   Wt 221 lb (100.2 kg)   SpO2 98%   BMI 32.64 kg/m  Constitutional:  Generally well appearing female in no acute distress. Psychiatric: Pleasant. Normal mood and affect. Behavior is normal. EENT: Pupils normal.  Conjunctivae are normal. No scleral icterus. Neck supple.  Cardiovascular: Normal rate, regular rhythm. No edema Pulmonary/chest: Effort normal and breath sounds normal. No wheezing, rales or rhonchi. Abdominal: Soft, nondistended, nontender. Bowel sounds active throughout. There are no masses palpable. No hepatomegaly. Neurological: Alert and oriented to person place and time. Skin: Skin is warm and dry. No rashes noted.  Tye Savoy, NP  04/25/2022, 10:41 AM  Cc:  Bo Merino, FNP

## 2022-04-30 DIAGNOSIS — I071 Rheumatic tricuspid insufficiency: Secondary | ICD-10-CM | POA: Diagnosis not present

## 2022-04-30 DIAGNOSIS — Z6832 Body mass index (BMI) 32.0-32.9, adult: Secondary | ICD-10-CM | POA: Diagnosis not present

## 2022-04-30 DIAGNOSIS — M797 Fibromyalgia: Secondary | ICD-10-CM | POA: Diagnosis not present

## 2022-04-30 DIAGNOSIS — E785 Hyperlipidemia, unspecified: Secondary | ICD-10-CM | POA: Diagnosis not present

## 2022-04-30 DIAGNOSIS — R251 Tremor, unspecified: Secondary | ICD-10-CM | POA: Diagnosis not present

## 2022-04-30 DIAGNOSIS — I10 Essential (primary) hypertension: Secondary | ICD-10-CM | POA: Diagnosis not present

## 2022-04-30 DIAGNOSIS — R69 Illness, unspecified: Secondary | ICD-10-CM | POA: Diagnosis not present

## 2022-04-30 DIAGNOSIS — G459 Transient cerebral ischemic attack, unspecified: Secondary | ICD-10-CM | POA: Diagnosis not present

## 2022-04-30 DIAGNOSIS — Z01818 Encounter for other preprocedural examination: Secondary | ICD-10-CM | POA: Diagnosis not present

## 2022-04-30 DIAGNOSIS — K219 Gastro-esophageal reflux disease without esophagitis: Secondary | ICD-10-CM | POA: Diagnosis not present

## 2022-05-01 DIAGNOSIS — R69 Illness, unspecified: Secondary | ICD-10-CM | POA: Diagnosis not present

## 2022-05-01 DIAGNOSIS — K2270 Barrett's esophagus without dysplasia: Secondary | ICD-10-CM | POA: Diagnosis not present

## 2022-05-01 DIAGNOSIS — I10 Essential (primary) hypertension: Secondary | ICD-10-CM | POA: Diagnosis not present

## 2022-05-01 DIAGNOSIS — M1990 Unspecified osteoarthritis, unspecified site: Secondary | ICD-10-CM | POA: Diagnosis not present

## 2022-05-01 DIAGNOSIS — E785 Hyperlipidemia, unspecified: Secondary | ICD-10-CM | POA: Diagnosis not present

## 2022-05-01 DIAGNOSIS — Z008 Encounter for other general examination: Secondary | ICD-10-CM | POA: Diagnosis not present

## 2022-05-01 DIAGNOSIS — K219 Gastro-esophageal reflux disease without esophagitis: Secondary | ICD-10-CM | POA: Diagnosis not present

## 2022-05-01 DIAGNOSIS — J449 Chronic obstructive pulmonary disease, unspecified: Secondary | ICD-10-CM | POA: Diagnosis not present

## 2022-05-01 DIAGNOSIS — F330 Major depressive disorder, recurrent, mild: Secondary | ICD-10-CM | POA: Diagnosis not present

## 2022-05-01 DIAGNOSIS — G250 Essential tremor: Secondary | ICD-10-CM | POA: Diagnosis not present

## 2022-05-01 DIAGNOSIS — M797 Fibromyalgia: Secondary | ICD-10-CM | POA: Diagnosis not present

## 2022-05-01 DIAGNOSIS — F4321 Adjustment disorder with depressed mood: Secondary | ICD-10-CM | POA: Diagnosis not present

## 2022-05-01 DIAGNOSIS — N3281 Overactive bladder: Secondary | ICD-10-CM | POA: Diagnosis not present

## 2022-05-12 ENCOUNTER — Other Ambulatory Visit: Payer: Self-pay | Admitting: Nurse Practitioner

## 2022-05-12 ENCOUNTER — Other Ambulatory Visit: Payer: Self-pay | Admitting: Internal Medicine

## 2022-05-12 DIAGNOSIS — I10 Essential (primary) hypertension: Secondary | ICD-10-CM

## 2022-05-13 NOTE — Telephone Encounter (Signed)
Refilled Protonix 

## 2022-05-14 NOTE — Telephone Encounter (Signed)
last RF 01/13/22 #90 1 RF should have enough until November  Requested Prescriptions  Refused Prescriptions Disp Refills  . hydrochlorothiazide (HYDRODIURIL) 12.5 MG tablet [Pharmacy Med Name: HYDROCHLOROTHIAZIDE 12.5 MG TB] 90 tablet 1    Sig: TAKE 1 TABLET BY MOUTH EVERY DAY     Cardiovascular: Diuretics - Thiazide Failed - 05/12/2022  5:26 PM      Failed - Cr in normal range and within 180 days    Creat  Date Value Ref Range Status  01/31/2021 0.80 0.50 - 0.99 mg/dL Final    Comment:    For patients >89 years of age, the reference limit for Creatinine is approximately 13% higher for people identified as African-American. .          Failed - K in normal range and within 180 days    Potassium  Date Value Ref Range Status  01/31/2021 4.5 3.5 - 5.3 mmol/L Final  05/25/2012 4.1 3.5 - 5.1 mmol/L Final         Failed - Na in normal range and within 180 days    Sodium  Date Value Ref Range Status  01/31/2021 143 135 - 146 mmol/L Final  04/28/2016 143 134 - 144 mmol/L Final  05/25/2012 140 136 - 145 mmol/L Final         Passed - Last BP in normal range    BP Readings from Last 1 Encounters:  04/25/22 118/68         Passed - Valid encounter within last 6 months    Recent Outpatient Visits          3 months ago Recurrent major depressive disorder, in partial remission [facility])   [facility] Bo Merino, FNP   4 months ago Recurrent major depressive disorder, in partial remission [facility])   [facility] [place], Kristeen Miss, PA-C   7 months ago Obesity (BMI 30.0-34.9)   [facility] Bo Merino, FNP   7 months ago Viral upper respiratory tract infection   [place], FNP   1 year ago Shortness of breath   [facility] Bo Merino, FNP      Future Appointments            In 2 months [facility], [facility]

## 2022-05-16 ENCOUNTER — Encounter: Payer: Self-pay | Admitting: Internal Medicine

## 2022-05-16 ENCOUNTER — Ambulatory Visit (AMBULATORY_SURGERY_CENTER): Payer: Medicare HMO | Admitting: Internal Medicine

## 2022-05-16 VITALS — BP 121/70 | HR 53 | Temp 97.1°F | Resp 17 | Ht 69.0 in | Wt 221.0 lb

## 2022-05-16 DIAGNOSIS — I2510 Atherosclerotic heart disease of native coronary artery without angina pectoris: Secondary | ICD-10-CM | POA: Diagnosis not present

## 2022-05-16 DIAGNOSIS — K635 Polyp of colon: Secondary | ICD-10-CM

## 2022-05-16 DIAGNOSIS — K219 Gastro-esophageal reflux disease without esophagitis: Secondary | ICD-10-CM | POA: Diagnosis not present

## 2022-05-16 DIAGNOSIS — D122 Benign neoplasm of ascending colon: Secondary | ICD-10-CM | POA: Diagnosis not present

## 2022-05-16 DIAGNOSIS — K449 Diaphragmatic hernia without obstruction or gangrene: Secondary | ICD-10-CM | POA: Diagnosis not present

## 2022-05-16 DIAGNOSIS — R69 Illness, unspecified: Secondary | ICD-10-CM | POA: Diagnosis not present

## 2022-05-16 DIAGNOSIS — Z09 Encounter for follow-up examination after completed treatment for conditions other than malignant neoplasm: Secondary | ICD-10-CM

## 2022-05-16 DIAGNOSIS — Z86010 Personal history of colonic polyps: Secondary | ICD-10-CM

## 2022-05-16 DIAGNOSIS — K2270 Barrett's esophagus without dysplasia: Secondary | ICD-10-CM | POA: Diagnosis not present

## 2022-05-16 MED ORDER — SODIUM CHLORIDE 0.9 % IV SOLN: 500.0000 mL | Freq: Once | INTRAVENOUS | Status: DC

## 2022-05-16 NOTE — Patient Instructions (Signed)
Await pathology Resume Plavix today!!  Please read over handouts about polyps, hemorrhoids, diverticulosis, Barretts Esophagus, and hiatal hernias Next colonoscopy and endoscopy 5 years Please continue your normal medications  YOU HAD AN ENDOSCOPIC PROCEDURE TODAY AT [facility]:   Refer to the procedure report that was given to you for any specific questions about what was found during the examination.  If the procedure report does not answer your questions, please call your gastroenterologist to clarify.  If you requested that your care partner not be given the details of your procedure findings, then the procedure report has been included in a sealed envelope for you to review at your convenience later.  YOU SHOULD EXPECT: Some feelings of bloating in the abdomen. Passage of more gas than usual.  Walking can help get rid of the air that was put into your GI tract during the procedure and reduce the bloating. If you had a lower endoscopy (such as a colonoscopy or flexible sigmoidoscopy) you may notice spotting of blood in your stool or on the toilet paper. If you underwent a bowel prep for your procedure, you may not have a normal bowel movement for a few days.  Please Note:  You might notice some irritation and congestion in your nose or some drainage.  This is from the oxygen used during your procedure.  There is no need for concern and it should clear up in a day or so.  SYMPTOMS TO REPORT IMMEDIATELY:  Following lower endoscopy (colonoscopy or flexible sigmoidoscopy):  Excessive amounts of blood in the stool  Significant tenderness or worsening of abdominal pains  Swelling of the abdomen that is new, acute  Fever of 100F or higher  Following upper endoscopy (EGD)  Vomiting of blood or coffee ground material  New chest pain or pain under the shoulder blades  Painful or persistently difficult swallowing  New shortness of breath  Fever of 100F or higher  Black,  tarry-looking stools  For urgent or emergent issues, a gastroenterologist can be reached at any hour by calling [phone]. Do not use MyChart messaging for urgent concerns.    DIET:  We do recommend a small meal at first, but then you may proceed to your regular diet.  Drink plenty of fluids but you should avoid alcoholic beverages for 24 hours.  ACTIVITY:  You should plan to take it easy for the rest of today and you should NOT DRIVE or use heavy machinery until tomorrow (because of the sedation medicines used during the test).    FOLLOW UP: Our staff will call the number listed on your records the next business day following your procedure.  We will call around 7:15- 8:00 am to check on you and address any questions or concerns that you may have regarding the information given to you following your procedure. If we do not reach you, we will leave a message.     If any biopsies were taken you will be contacted by phone or by letter within the next 1-3 weeks.  Please call us at [phone] if you have not heard about the biopsies in 3 weeks.    SIGNATURES/CONFIDENTIALITY: You and/or your care partner have signed paperwork which will be entered into your electronic medical record.  These signatures attest to the fact that that the information above on your After Visit Summary has been reviewed and is understood.  Full responsibility of the confidentiality of this discharge information lies with you and/or your care-partner.

## 2022-05-16 NOTE — Progress Notes (Signed)
Chief Complaint:  history of colon polyps     Assessment &  Plan    # 71 yo female with history of recurrent adenomatous colon polyps ( 2005, 2008, 2013, 2018). Due for 5 year interval colonoscopy ( received letter).  Schedule for a colonoscopy. The risks and benefits of colonoscopy with possible polypectomy / biopsies were discussed and the patient agrees to proceed.    # GERD complicated by esophageal stricture and Barrett's esophagus (1.5 cm on last EGD in 2016). No dysplasia on biopsies on 2016. Recommendation at that time was for surveillance EGD in 3 years but not done.  Schedule for surveillance EGD. The risks and benefits of EGD with possible biopsies were discussed with the patient who agrees to proceed.    Remote history of CVA, on plavix Hold Plavix for 5 days before procedures - will instruct when and how to resume after procedure. Patient understands that there is a low but real risk of cardiovascular event such as heart attack, stroke, or embolism /  thrombosis, or ischemia while off Plavix. The patient consents to proceed. Will communicate by phone or EMR with patient's prescribing provider to confirm that holding Plavix is reasonable in this case.      HPI    Robin Bryan is a 72 y.o. female known to Dr. Henrene Pastor with a past medical history significant for fibromyalgia, tremors, hypertension, CVA, depression, GERD complicated by peptic stricture requiring esophageal dilation and Barrett's esophagus , adenomatous colon polyps , and fecal incontinence, hepatic hemangioma . See [place] /[facility] for additional history      Interval History:  Robin Bryan is here to get scheduled for colonoscopy. She received a letter asking her to make an appt to discuss. She is on plavix for a history of CVA. No blood in stool. No bowel changes. No [place] of colon cancer.    Previous GI Evaluation  July 2018 surveillance colonoscopy - One 3 mm polyp in the descending colon, removed with a cold snare. Resected  and retrieved. - Diverticulosis in the left colon. - Internal hemorrhoids. - The entire examined colon is otherwise normal on direct and retroflexion views   July 2018 Surveillance EGD - Benign-appearing esophageal stenosis. Dilated. - Esophageal mucosal changes secondary to established short-segment Barrett's disease. Biopsied. - Normal stomach. - Normal examined duodenum.   Surgical [P], random sites - BENIGN COLONIC MUCOSA. - NO ACTIVE INFLAMMATION OR EVIDENCE OF MICROSCOPIC COLITIS. - NO DYSPLASIA OR MALIGNANCY. 2. Surgical [P], descending, polyp - TUBULAR ADENOMA. - NO HIGH GRADE DYSPLASIA OR MALIGNANCY. 3. Surgical [P], short segment of Barrett's - INTESTINAL METAPLASIA (GOBLET CELL METAPLASIA) CONSISTENT WITH BARRETT'S ESOPHAGUS. - NO DYSPLASIA OR MALIGNANCY.     Labs:      Latest Ref Rng & Units 01/31/2021   10:42 AM 01/31/2020   11:05 AM 11/26/2017   12:01 PM  CBC  WBC 3.8 - 10.8 Thousand/uL 6.5  6.3  6.6   Hemoglobin 11.7 - 15.5 g/dL 12.8  13.8  13.4   Hematocrit 35.0 - 45.0 % 37.8  40.4  38.9   Platelets 140 - 400 Thousand/uL 256  233  337           Latest Ref Rng & Units 01/31/2021   10:42 AM 01/31/2020   11:05 AM 08/31/2018    9:06 AM  Hepatic Function  Total Protein 6.1 - 8.1 g/dL 6.2  6.3  6.5   AST 10 - 35 U/L '12  15  10   '$ ALT 6 -  29 U/L '15  17  14   '$ Total Bilirubin 0.2 - 1.2 mg/dL 0.4  0.5  0.4             Past Medical History:  Diagnosis Date   Allergy     Anxiety     Arthritis     Arthritis of both feet 11/26/2017    xrays March 2019   Asthma     Barrett's esophagus     Bronchitis     Calcaneal spur of both feet 11/26/2017    Foot xrays March 2019   Depression     Diverticulosis     Esophageal stricture     Fatty liver 09/08/2018    [place] Jan 2020   Fibromyalgia     GERD (gastroesophageal reflux disease)     Hx of adenomatous colonic polyps     Hyperlipidemia     Hypertension     Internal hemorrhoids     Intramural leiomyoma of uterus      Liver hemangioma 09/08/2018    [place] Jan 2020   Major depressive disorder     Myocardial infarction [facility])     Stroke [facility])             Past Surgical History:  Procedure Laterality Date   ABDOMINAL HYSTERECTOMY       CATARACT EXTRACTION Bilateral 03/2019   INCONTINENCE SURGERY       TUBAL LIGATION       tumor excision ovaries          Current Medications, Allergies, Family History and Social History were reviewed in [facility] record.           Current Outpatient Medications  Medication Sig Dispense Refill   albuterol (VENTOLIN HFA) 108 (90 Base) MCG/ACT inhaler Inhale 2 puffs into the lungs every 6 (six) hours as needed for wheezing or shortness of breath. 6.7 g 3   clopidogrel (PLAVIX) 75 MG tablet Take 1 tablet (75 mg total) by mouth daily. 90 tablet 1   escitalopram (LEXAPRO) 20 MG tablet Take 1 tablet (20 mg total) by mouth daily. 90 tablet 1   fluticasone (FLONASE) 50 MCG/ACT nasal spray SPRAY 2 SPRAYS INTO EACH NOSTRIL EVERY DAY 48 mL 1   fluticasone-salmeterol (ADVAIR HFA) 45-21 MCG/ACT inhaler Inhale 2 puffs into the lungs 2 (two) times daily. 1 each 12   gabapentin (NEURONTIN) 400 MG capsule Take 800 mg by mouth 4 (four) times daily.        guaiFENesin-dextromethorphan (ROBITUSSIN DM) 100-10 MG/5ML syrup Take 10 mLs by mouth every 6 (six) hours as needed for cough. 118 mL 0   hydrochlorothiazide (HYDRODIURIL) 12.5 MG tablet TAKE 1 TABLET BY MOUTH EVERY DAY 90 tablet 1   hydrOXYzine (VISTARIL) 25 MG capsule Take 1 capsule (25 mg total) by mouth 3 (three) times daily as needed for anxiety (insomnia). 60 capsule 1   loratadine (CLARITIN) 10 MG tablet TAKE 1 TABLET BY MOUTH EVERY DAY 90 tablet 3   meloxicam (MOBIC) 15 MG tablet TAKE 1 TABLET (15 MG TOTAL) BY MOUTH DAILY. 30 tablet 3   MYRBETRIQ 50 MG TB24 tablet TK 1 T PO QD   3   oxyCODONE-acetaminophen (PERCOCET/ROXICET) 5-325 MG tablet Take 1 tablet by mouth every 6 (six) hours as needed. for pain        pantoprazole (PROTONIX) 40 MG tablet Office visit for further refills.  Take one tablet by mouth twice a day; 180 tablet 0   primidone (MYSOLINE) 50  MG tablet Take 1 tablet by mouth daily.   6   propranolol ER (INDERAL LA) 60 MG 24 hr capsule Take 60 mg by mouth daily.        rosuvastatin (CRESTOR) 10 MG tablet TAKE 1 TABLET BY MOUTH EVERY DAY 90 tablet 0   tiZANidine (ZANAFLEX) 4 MG tablet Take 4-8 mg by mouth at bedtime.       valsartan (DIOVAN) 80 MG tablet TAKE 1 TABLET BY MOUTH EVERY DAY 90 tablet 0   EPINEPHrine 0.3 mg/0.3 mL IJ SOAJ injection Inject 0.3 mLs (0.3 mg total) into the muscle once for 1 dose. 0.3 mL 0    No current facility-administered medications for this visit.      Review of Systems: No chest pain. No shortness of breath. No urinary complaints.      Physical Exam      Wt Readings from Last 3 Encounters:  04/25/22 221 lb (100.2 kg)  01/31/22 225 lb 11.2 oz (102.4 kg)  01/03/22 227 lb 12.8 oz (103.3 kg)      BP 118/68   Pulse 86   Ht '5\' 9"'$  (1.753 m)   Wt 221 lb (100.2 kg)   SpO2 98%   BMI 32.64 kg/m  Constitutional:  Generally well appearing female in no acute distress. Psychiatric: Pleasant. Normal mood and affect. Behavior is normal. EENT: Pupils normal.  Conjunctivae are normal. No scleral icterus. Neck supple.  Cardiovascular: Normal rate, regular rhythm. No edema Pulmonary/chest: Effort normal and breath sounds normal. No wheezing, rales or rhonchi. Abdominal: Soft, nondistended, nontender. Bowel sounds active throughout. There are no masses palpable. No hepatomegaly. Neurological: Alert and oriented to person place and time. Skin: Skin is warm and dry. No rashes noted.   Tye Savoy, NP  04/25/2022, 10:41 AM

## 2022-05-16 NOTE — Progress Notes (Signed)
Report to PACU, RN, vss, BBS= Clear.  

## 2022-05-16 NOTE — Progress Notes (Signed)
Vitals-AS  Pt's states no medical or surgical changes since previsit or office visit.  

## 2022-05-16 NOTE — Op Note (Signed)
[facility] Patient Name: Robin Bryan Procedure Date: 05/16/2022 10:40 AM MRN: [id number] Endoscopist: Docia Chuck. Henrene Pastor , MD Age: 71 Referring MD:  Date of Birth: [birth year] Gender: Female Account #: [id number] Procedure:                Colonoscopy with cold snare polypectomy x 1 Indications:              High risk colon cancer surveillance: Personal                            history of multiple (3 or more) adenomas. Previous                            examinations 2005, 2008, 2013, 2018 Medicines:                Monitored Anesthesia Care Procedure:                Pre-Anesthesia Assessment:                           - Prior to the procedure, a History and Physical                            was performed, and patient medications and                            allergies were reviewed. The patient's tolerance of                            previous anesthesia was also reviewed. The risks                            and benefits of the procedure and the sedation                            options and risks were discussed with the patient.                            All questions were answered, and informed consent                            was obtained. Prior Anticoagulants: The patient has                            taken Plavix (clopidogrel), last dose was 5 days                            prior to procedure. ASA Grade Assessment: III - A                            patient with severe systemic disease. After                            reviewing the risks and benefits, the patient was  deemed in satisfactory condition to undergo the                            procedure.                           After obtaining informed consent, the colonoscope                            was passed under direct vision. Throughout the                            procedure, the patient's blood pressure, pulse, and                            oxygen saturations were monitored  continuously. The                            CF [id number] #[id number] was introduced through the anus                            and advanced to the the cecum, identified by                            appendiceal orifice and ileocecal valve. The                            ileocecal valve, appendiceal orifice, and rectum                            were photographed. The quality of the bowel                            preparation was excellent. The colonoscopy was                            performed without difficulty. The patient tolerated                            the procedure well. The bowel preparation used was                            SUPREP via split dose instruction. Scope In: 10:48:44 AM Scope Out: 11:00:46 AM Scope Withdrawal Time: 0 hours 9 minutes 1 second  Total Procedure Duration: 0 hours 12 minutes 2 seconds  Findings:                 A 2 mm polyp was found in the ascending colon. The                            polyp was removed with a cold snare. Resection and                            retrieval were complete.  Multiple diverticula were found in the left colon                            and right colon.                           Internal hemorrhoids were found during retroflexion.                           The exam was otherwise without abnormality on                            direct and retroflexion views. Complications:            No immediate complications. Estimated blood loss:                            None. Estimated Blood Loss:     Estimated blood loss: none. Impression:               - One 2 mm polyp in the ascending colon, removed                            with a cold snare. Resected and retrieved.                           - Diverticulosis in the left colon and in the right                            colon.                           - Internal hemorrhoids.                           - The examination was otherwise normal on direct                             and retroflexion views. Recommendation:           - Repeat colonoscopy in 5 years for surveillance.                           - Resume Plavix (clopidogrel) today at prior dose.                           - Patient has a contact number available for                            emergencies. The signs and symptoms of potential                            delayed complications were discussed with the                            patient. Return to normal activities tomorrow.  Written discharge instructions were provided to the                            patient.                           - Resume previous diet.                           - Continue present medications.                           - Await pathology results. Docia Chuck. Henrene Pastor, MD 05/16/2022 11:05:51 AM This report has been signed electronically.

## 2022-05-16 NOTE — Op Note (Signed)
[facility] Patient Name: Robin Bryan Procedure Date: 05/16/2022 10:40 AM MRN: [id number] Endoscopist: Docia Chuck. Henrene Pastor , MD Age: 71 Referring MD:  Date of Birth: 05/09/1951 Gender: Female Account #: [id number] Procedure:                Upper GI endoscopy with biopsies Indications:              Esophageal reflux, Surveillance for malignancy due                            to personal history of Barrett's esophagus Medicines:                Monitored Anesthesia Care Procedure:                Pre-Anesthesia Assessment:                           - Prior to the procedure, a History and Physical                            was performed, and patient medications and                            allergies were reviewed. The patient's tolerance of                            previous anesthesia was also reviewed. The risks                            and benefits of the procedure and the sedation                            options and risks were discussed with the patient.                            All questions were answered, and informed consent                            was obtained. Prior Anticoagulants: The patient has                            taken Plavix (clopidogrel), last dose was 5 days                            prior to procedure. ASA Grade Assessment: III - A                            patient with severe systemic disease. After                            reviewing the risks and benefits, the patient was                            deemed in satisfactory condition to undergo the  procedure.                           After obtaining informed consent, the endoscope was                            passed under direct vision. Throughout the                            procedure, the patient's blood pressure, pulse, and                            oxygen saturations were monitored continuously. The                            Endoscope was introduced through  the mouth, and                            advanced to the second part of duodenum. The upper                            GI endoscopy was accomplished without difficulty.                            The patient tolerated the procedure well. Scope In: Scope Out: Findings:                 The esophagus revealed short segment Barrett's                            esophagus (less than 1 cm) in the distalmost                            portion. See images. Biopsies were taken with a                            cold forceps for histology. No active inflammation.                            The esophagus was otherwise normal                           The stomach revealed a moderately large hiatal                            hernia but was otherwise normal.                           The examined duodenum was normal.                           The cardia and gastric fundus were normal on                            retroflexion. Complications:  No immediate complications. Estimated Blood Loss:     Estimated blood loss: none. Impression:               1. Short segment Barrett's esophagus. Biopsied.                           2. Moderate hiatal hernia.                           3. Otherwise normal EGD. Recommendation:           - Patient has a contact number available for                            emergencies. The signs and symptoms of potential                            delayed complications were discussed with the                            patient. Return to normal activities tomorrow.                            Written discharge instructions were provided to the                            patient.                           - Resume previous diet.                           - Continue present medications.                           - Await pathology results.                           - Resume Plavix (clopidogrel) at prior dose today.                           - Repeat EGD 5 years if no  dysplasia on biopsies Robin Bryan N. Henrene Pastor, MD 05/16/2022 11:21:29 AM This report has been signed electronically.

## 2022-05-16 NOTE — Progress Notes (Signed)
Called to room to assist during endoscopic procedure.  Patient ID and intended procedure confirmed with present staff. Received instructions for my participation in the procedure from the performing physician.  

## 2022-05-19 ENCOUNTER — Telehealth: Payer: Self-pay

## 2022-05-19 NOTE — Telephone Encounter (Signed)
  Follow up Call-     05/16/2022   10:17 AM 05/16/2022   10:12 AM  Call back number  Post procedure Call Back phone  # [phone]   Permission to leave phone message  Yes     Patient questions:  Do you have a fever, pain , or abdominal swelling? No. Pain Score  0 *  Have you tolerated food without any problems? Yes.    Have you been able to return to your normal activities? Yes.    Do you have any questions about your discharge instructions: Diet   No. Medications  No. Follow up visit  No.  Do you have questions or concerns about your Care? No.  Actions: * If pain score is 4 or above: No action needed, pain <4.

## 2022-05-20 ENCOUNTER — Encounter (INDEPENDENT_AMBULATORY_CARE_PROVIDER_SITE_OTHER): Payer: Medicare HMO | Admitting: Internal Medicine

## 2022-05-22 DIAGNOSIS — H02834 Dermatochalasis of left upper eyelid: Secondary | ICD-10-CM | POA: Diagnosis not present

## 2022-05-22 DIAGNOSIS — H02831 Dermatochalasis of right upper eyelid: Secondary | ICD-10-CM | POA: Diagnosis not present

## 2022-05-22 DIAGNOSIS — I10 Essential (primary) hypertension: Secondary | ICD-10-CM | POA: Diagnosis not present

## 2022-05-22 DIAGNOSIS — E785 Hyperlipidemia, unspecified: Secondary | ICD-10-CM | POA: Diagnosis not present

## 2022-05-22 DIAGNOSIS — H02403 Unspecified ptosis of bilateral eyelids: Secondary | ICD-10-CM | POA: Diagnosis not present

## 2022-05-22 DIAGNOSIS — H02839 Dermatochalasis of unspecified eye, unspecified eyelid: Secondary | ICD-10-CM | POA: Diagnosis not present

## 2022-05-22 DIAGNOSIS — Z79899 Other long term (current) drug therapy: Secondary | ICD-10-CM | POA: Diagnosis not present

## 2022-05-22 DIAGNOSIS — L987 Excessive and redundant skin and subcutaneous tissue: Secondary | ICD-10-CM | POA: Diagnosis not present

## 2022-05-22 DIAGNOSIS — H5340 Unspecified visual field defects: Secondary | ICD-10-CM | POA: Diagnosis not present

## 2022-05-26 ENCOUNTER — Encounter: Payer: Self-pay | Admitting: Internal Medicine

## 2022-06-10 ENCOUNTER — Other Ambulatory Visit: Payer: Self-pay

## 2022-06-10 ENCOUNTER — Ambulatory Visit (INDEPENDENT_AMBULATORY_CARE_PROVIDER_SITE_OTHER): Payer: Medicare HMO | Admitting: Nurse Practitioner

## 2022-06-10 ENCOUNTER — Encounter: Payer: Self-pay | Admitting: Nurse Practitioner

## 2022-06-10 VITALS — BP 128/82 | HR 74 | Temp 98.1°F | Resp 18 | Ht 68.0 in | Wt 223.2 lb

## 2022-06-10 DIAGNOSIS — M542 Cervicalgia: Secondary | ICD-10-CM | POA: Diagnosis not present

## 2022-06-10 DIAGNOSIS — L723 Sebaceous cyst: Secondary | ICD-10-CM | POA: Diagnosis not present

## 2022-06-10 DIAGNOSIS — Z23 Encounter for immunization: Secondary | ICD-10-CM

## 2022-06-10 MED ORDER — TIZANIDINE HCL 4 MG PO TABS: 2.0000 mg | ORAL_TABLET | Freq: Three times a day (TID) | ORAL | 0 refills | Status: DC | PRN

## 2022-06-10 MED ORDER — PREDNISONE 10 MG (21) PO TBPK: ORAL_TABLET | ORAL | 0 refills | Status: DC

## 2022-06-10 MED ORDER — NAPROXEN 500 MG PO TABS: 500.0000 mg | ORAL_TABLET | Freq: Two times a day (BID) | ORAL | 0 refills | Status: DC

## 2022-06-10 NOTE — Progress Notes (Signed)
BP 128/82   Pulse 74   Temp 98.1 F (36.7 C) (Oral)   Resp 18   Ht '5\' 8"'$  (1.727 m)   Wt 223 lb 3.2 oz (101.2 kg)   SpO2 97%   BMI 33.94 kg/m    Subjective:    Patient ID: Robin Bryan, female    DOB: 05-Jun-1951, 71 y.o.   MRN: [id number]  HPI: Robin Bryan is a 71 y.o. female  Chief Complaint  Patient presents with   Cyst    On right side of shoulder/neck for 2 months. It has changed in size   Cyst: has been there about two months.  Cyst is located on the right upper back.  There is no signs of infection.  She says that it feels like it has gotten bigger.  It appears to be a sebaceous cyst.  Patient states she would like to have it removed.  We will place referral for surgery.  Neck pain:  started a couple days ago.  She says the pain starts on the right side of her neck and radiates down her right arm.  She denies any injury or trauma.  She has no midline tenderness.  Discussed likely muscleskeletal pain will treat with naproxen, steroid taper and muscle relaxer.  She can also use moist heat therapy.    Relevant past medical, surgical, family and social history reviewed and updated as indicated. Interim medical history since our last visit reviewed. Allergies and medications reviewed and updated.  Review of Systems  Constitutional: Negative for fever or weight change.  Respiratory: Negative for cough and shortness of breath.   Cardiovascular: Negative for chest pain or palpitations.  Gastrointestinal: Negative for abdominal pain, no bowel changes.  Musculoskeletal: Negative for gait problem or joint swelling.  Positive for right-sided neck pain Skin: Negative for rash.  Positive for cyst on back Neurological: Negative for dizziness or headache.  No other specific complaints in a complete review of systems (except as listed in HPI above).      Objective:    BP 128/82   Pulse 74   Temp 98.1 F (36.7 C) (Oral)   Resp 18   Ht '5\' 8"'$  (1.727 m)   Wt 223 lb  3.2 oz (101.2 kg)   SpO2 97%   BMI 33.94 kg/m   Wt Readings from Last 3 Encounters:  06/10/22 223 lb 3.2 oz (101.2 kg)  05/16/22 221 lb (100.2 kg)  04/25/22 221 lb (100.2 kg)    Physical Exam  Constitutional: Patient appears well-developed and well-nourished. Obese  No distress.  HEENT: head atraumatic, normocephalic, pupils equal and reactive to light, neck supple Cardiovascular: Normal rate, regular rhythm and normal heart sounds.  No murmur heard. No BLE edema. Pulmonary/Chest: Effort normal and breath sounds normal. No respiratory distress. Abdominal: Soft.  There is no tenderness. Skin: The patient is cyst noted to upper right back MSK: tightness noted on upper right trapezius muscle, no decrease ROM Psychiatric: Patient has a normal mood and affect. behavior is normal. Judgment and thought content normal.   Assessment & Plan:   Problem List Items Addressed This Visit   None Visit Diagnoses     Sebaceous cyst    -  Primary   Cyst has been present for 2 months.  No signs of infection.  Patient would like to have it removed.  Referral placed to surgery.   Relevant Orders   Ambulatory referral to General Surgery   Neck pain  Patient reports right-sided neck pain over the last couple days.  Less likely musculoskeletal.  We will treat with steroids, naproxen and muscle relaxer.     Relevant Medications   tiZANidine (ZANAFLEX) 4 MG tablet   predniSONE (STERAPRED UNI-PAK 21 TAB) 10 MG (21) TBPK tablet   naproxen (NAPROSYN) 500 MG tablet   Need for influenza vaccination       Relevant Orders   Flu Vaccine QUAD High Dose(Fluad) (Completed)        Follow up plan: Return if symptoms worsen or fail to improve.

## 2022-06-15 ENCOUNTER — Other Ambulatory Visit: Payer: Self-pay | Admitting: Family Medicine

## 2022-06-17 ENCOUNTER — Other Ambulatory Visit: Payer: Self-pay

## 2022-06-17 ENCOUNTER — Encounter: Payer: Self-pay | Admitting: Surgery

## 2022-06-17 ENCOUNTER — Ambulatory Visit: Payer: Medicare HMO | Admitting: Surgery

## 2022-06-17 VITALS — BP 105/74 | HR 85 | Temp 98.4°F | Ht 68.0 in | Wt 220.0 lb

## 2022-06-17 DIAGNOSIS — L720 Epidermal cyst: Secondary | ICD-10-CM

## 2022-06-17 NOTE — Patient Instructions (Signed)
Please call with any questions or concerns.

## 2022-06-17 NOTE — Progress Notes (Signed)
Patient ID: Robin Bryan, female   DOB: Nov 16, 1950, 71 y.o.   MRN: [id number]  Chief Complaint: Dermal cyst overlying right trapezius  History of Present Illness Robin Bryan is a 71 y.o. female with a subcentimeter dermal cyst, overlying the right trapezius.  Patient reports a history of sporadic neck pain with some radiation down to her right arm.  No persistent constant or progressive pain or weakness.  Past Medical History Past Medical History:  Diagnosis Date   Allergy    Anxiety    Arthritis    Arthritis of both feet 11/26/2017   xrays March 2019   Asthma    Barrett's esophagus    Bronchitis    Calcaneal spur of both feet 11/26/2017   Foot xrays March 2019   Depression    Diverticulosis    Esophageal stricture    Fatty liver 09/08/2018   [place] Jan 2020   Fibromyalgia    GERD (gastroesophageal reflux disease)    Hx of adenomatous colonic polyps    Hyperlipidemia    Hypertension    Internal hemorrhoids    Intramural leiomyoma of uterus    Liver hemangioma 09/08/2018   [place] Jan 2020   Major depressive disorder    Myocardial infarction [facility])    Stroke [facility])       Past Surgical History:  Procedure Laterality Date   ABDOMINAL HYSTERECTOMY     CATARACT EXTRACTION Bilateral 03/2019   INCONTINENCE SURGERY     TUBAL LIGATION     tumor excision ovaries      Allergies  Allergen Reactions   Shellfish Allergy Swelling   Vicodin [Hydrocodone-Acetaminophen] Nausea Only    Current Outpatient Medications  Medication Sig Dispense Refill   Acetaminophen Extra Strength 500 MG TABS Take 2 tablets by mouth every 6 (six) hours as needed.     albuterol (VENTOLIN HFA) 108 (90 Base) MCG/ACT inhaler Inhale 2 puffs into the lungs every 6 (six) hours as needed for wheezing or shortness of breath. 6.7 g 3   clopidogrel (PLAVIX) 75 MG tablet Take 1 tablet (75 mg total) by mouth daily. 90 tablet 1   escitalopram (LEXAPRO) 10 MG tablet Take 1 tablet by mouth daily.      escitalopram (LEXAPRO) 20 MG tablet TAKE 1 TABLET BY MOUTH EVERY DAY 90 tablet 1   gabapentin (NEURONTIN) 400 MG capsule Take 800 mg by mouth 4 (four) times daily.      hydrochlorothiazide (HYDRODIURIL) 12.5 MG tablet TAKE 1 TABLET BY MOUTH EVERY DAY 90 tablet 1   loratadine (CLARITIN) 10 MG tablet TAKE 1 TABLET BY MOUTH EVERY DAY 90 tablet 3   MYRBETRIQ 50 MG TB24 tablet TK 1 T PO QD  3   naproxen (NAPROSYN) 500 MG tablet Take 1 tablet (500 mg total) by mouth 2 (two) times daily with a meal. 30 tablet 0   oxyCODONE-acetaminophen (PERCOCET/ROXICET) 5-325 MG tablet Take 1 tablet by mouth every 6 (six) hours as needed. for pain     pantoprazole (PROTONIX) 40 MG tablet Take one tablet by mouth twice a day; 180 tablet 0   predniSONE (STERAPRED UNI-PAK 21 TAB) 10 MG (21) TBPK tablet Take as directed on package.  (60 mg po on day 1, 50 mg po on day 2...) 21 tablet 0   primidone (MYSOLINE) 50 MG tablet Take 1 tablet by mouth daily.  6   propranolol ER (INDERAL LA) 60 MG 24 hr capsule Take 60 mg by mouth daily.  rosuvastatin (CRESTOR) 10 MG tablet TAKE 1 TABLET BY MOUTH EVERY DAY 90 tablet 0   tiZANidine (ZANAFLEX) 4 MG tablet Take 4-8 mg by mouth at bedtime.     tiZANidine (ZANAFLEX) 4 MG tablet Take 0.5-1.5 tablets (2-6 mg total) by mouth every 8 (eight) hours as needed for muscle spasms (muscle tightness). 30 tablet 0   valsartan (DIOVAN) 80 MG tablet TAKE 1 TABLET BY MOUTH EVERY DAY 90 tablet 0   EPINEPHrine 0.3 mg/0.3 mL IJ SOAJ injection Inject 0.3 mLs (0.3 mg total) into the muscle once for 1 dose. 0.3 mL 0   No current facility-administered medications for this visit.    Family History Family History  Problem Relation Age of Onset   Colon polyps Mother    Diabetes Mother    Cancer - Lung Father    Lung cancer Father    Ovarian cancer Sister    Cancer Sister    Diabetes Sister    Heart disease Sister    Lung cancer Sister    Atrial fibrillation Sister    Cancer - Ovarian Sister     Cancer - Lung Sister    Diabetes Daughter    Parkinson's disease Cousin    Colon cancer Neg Hx    Esophageal cancer Neg Hx    Pancreatic cancer Neg Hx    Stomach cancer Neg Hx       Social History Social History   Tobacco Use   Smoking status: Former    Packs/day: 0.50    Years: 8.00    Total pack years: 4.00    Types: Cigarettes    Quit date: 05/27/2011    Years since quitting: 11.0   Smokeless tobacco: Never   Tobacco comments:    smoking cessation materials not required  Vaping Use   Vaping Use: Never used  Substance Use Topics   Alcohol use: No   Drug use: No        Review of Systems  Constitutional: Negative.   HENT: Negative.    Eyes: Negative.   Respiratory: Negative.    Cardiovascular: Negative.   Gastrointestinal: Negative.   Genitourinary: Negative.   Skin: Negative.   Neurological: Negative.   Psychiatric/Behavioral: Negative.        Physical Exam Blood pressure 105/74, pulse 85, temperature 98.4 F (36.9 C), temperature source Oral, height '5\' 8"'$  (1.727 m), weight 220 lb (99.8 kg), SpO2 97 %. Last Weight  Most recent update: 06/17/2022 10:27 AM    Weight  99.8 kg (220 lb)             CONSTITUTIONAL: Well developed, and nourished, appropriately responsive and aware without distress.   EYES: Sclera non-icteric.   EARS, NOSE, MOUTH AND THROAT:  The oropharynx is clear. Oral mucosa is pink and moist.  Hearing is intact to voice.  NECK: Trachea is midline, and there is no jugular venous distension.  LYMPH NODES:  Lymph nodes in the neck are not enlarged. RESPIRATORY:  Normal respiratory effort without pathologic use of accessory muscles. CARDIOVASCULAR: Heart is regular in rate and rhythm. GI: The abdomen is  soft, nontender, and nondistended. MUSCULOSKELETAL:  Symmetrical muscle tone appreciated in all four extremities.    SKIN: Skin turgor is normal. No pathologic skin lesions appreciated.   There is a subcentimeter density of the skin over  the right trapezius, no overlying epidermal skin changes.  No evidence of induration, punctum, flaking scaling or suggestion of dermal pathology.  This is clearly involving the dermis  and not even extending into the subcutaneous tissues.  I believe this has any role in her neck pain or pain radiating down her right arm.  NEUROLOGIC:  Motor and sensation appear grossly normal.  Cranial nerves are grossly without defect. PSYCH:  Alert and oriented to person, place and time. Affect is appropriate for situation.  Data Reviewed I have personally reviewed what is currently available of the patient's imaging, recent labs and medical records.   Labs:     Latest Ref Rng & Units 01/31/2021   10:42 AM 01/31/2020   11:05 AM 11/26/2017   12:01 PM  CBC  WBC 3.8 - 10.8 Thousand/uL 6.5  6.3  6.6   Hemoglobin 11.7 - 15.5 g/dL 12.8  13.8  13.4   Hematocrit 35.0 - 45.0 % 37.8  40.4  38.9   Platelets 140 - 400 Thousand/uL 256  233  337       Latest Ref Rng & Units 01/31/2021   10:42 AM 01/31/2020   11:05 AM 08/31/2018    9:06 AM  CMP  Glucose 65 - 99 mg/dL 116  113  109   BUN 7 - 25 mg/dL '19  14  18   '$ Creatinine 0.50 - 0.99 mg/dL 0.80  0.70  0.78   Sodium 135 - 146 mmol/L 143  144  141   Potassium 3.5 - 5.3 mmol/L 4.5  4.9  4.5   Chloride 98 - 110 mmol/L 107  107  105   CO2 20 - 32 mmol/L '29  31  29   '$ Calcium 8.6 - 10.4 mg/dL 9.1  9.4  9.3   Total Protein 6.1 - 8.1 g/dL 6.2  6.3  6.5   Total Bilirubin 0.2 - 1.2 mg/dL 0.4  0.5  0.4   AST 10 - 35 U/L '12  15  10   '$ ALT 6 - 29 U/L '15  17  14       '$ Imaging:  Within last 24 hrs: No results found.  Assessment    Small dermal cyst, right posterior trapezius area. Patient Active Problem List   Diagnosis Date Noted   Dermatochalasis of eyelid 07/30/2021   Ptosis, both eyelids 07/30/2021   Visual field defect 07/30/2021   Seasonal allergic rhinitis due to pollen 01/24/2020   Prediabetes 01/24/2020   Mild intermittent asthma without complication  92/42/6834   Cataract of both eyes 10/25/2019   Liver hemangioma 09/08/2018   Fatty liver 09/08/2018   Overactive bladder 03/19/2018   Obesity (BMI 30.0-34.9) 02/25/2018   Estrogen deficiency 01/08/2018   Postmenopausal 01/07/2018   Back pain 11/26/2017   Chronic kidney disease, stage III (moderate) ([place]) 11/26/2017   Calcaneal spur of both feet 11/26/2017   Arthritis of both feet 11/26/2017   Acute left-sided thoracic back pain 10/30/2015   Temporary cerebral vascular dysfunction 07/16/2015   Adaptation reaction 07/16/2015   Foot pain 07/16/2015   Acid reflux 07/16/2015   Abnormal LFTs 07/16/2015   Pericardial effusion 07/13/2015   Mass of chest wall, left 06/29/2015   Lipoma 06/29/2015   Pure hypercholesterolemia 04/28/2015   Gastro-esophageal reflux disease without esophagitis 04/28/2015   Benign essential HTN 04/28/2015   Hyperlipidemia 03/19/2015   Elevated hemoglobin A1c 03/19/2015   Neck muscle spasm 03/19/2015   Cognitive change 02/07/2015   Persistent dry cough 02/07/2015   Major depression in partial remission ([place]) 02/07/2015   Fibrositis 02/07/2015   Anxiety disorder 02/07/2015   History of anaphylaxis 02/07/2015   Pubic bone pain 10/02/2014  Mass of pelvis 06/05/2014   Ovarian cyst, left 05/26/2014   Abdominal pain, chronic, left lower quadrant 05/26/2014   CEREBROVASCULAR DISEASE 04/23/2010   COLONIC POLYPS, ADENOMATOUS 12/09/2006   ESOPHAGEAL STRICTURE 12/09/2006   GERD 12/09/2006   BARRETTS ESOPHAGUS 12/09/2006   DIVERTICULOSIS, COLON 12/09/2006    Plan    Offering excision, but making sure patient was aware that this is likely unrelated at all to her neck or right arm pain.  I believe she appreciated the reassurance that this did not present a risk to her from a malignant perspective.  And she had no interest in excision considering this is likely a benign dermal cyst.  She is clearly not having any current neck pain or right arm radiation pain, that I  could anticipate getting any better with excising this tiny lesion.  Face-to-face time spent with the patient and accompanying care providers(if present) was 20 minutes, with more than 50% of the time spent counseling, educating, and coordinating care of the patient.    These notes generated with voice recognition software. I apologize for typographical errors.  Ronny Bacon M.D., FACS 06/17/2022, 3:05 PM

## 2022-06-29 ENCOUNTER — Other Ambulatory Visit: Payer: Self-pay | Admitting: Nurse Practitioner

## 2022-06-29 DIAGNOSIS — I10 Essential (primary) hypertension: Secondary | ICD-10-CM

## 2022-06-29 DIAGNOSIS — E782 Mixed hyperlipidemia: Secondary | ICD-10-CM

## 2022-06-30 NOTE — Telephone Encounter (Signed)
Requested Prescriptions  Pending Prescriptions Disp Refills  . hydrochlorothiazide (HYDRODIURIL) 12.5 MG tablet [Pharmacy Med Name: HYDROCHLOROTHIAZIDE 12.5 MG TB] 90 tablet 0    Sig: TAKE 1 TABLET BY MOUTH EVERY DAY     Cardiovascular: Diuretics - Thiazide Failed - 06/29/2022 12:27 PM      Failed - Cr in normal range and within 180 days    Creat  Date Value Ref Range Status  01/31/2021 0.80 0.50 - 0.99 mg/dL Final    Comment:    For patients >46 years of age, the reference limit for Creatinine is approximately 13% higher for people identified as African-American. .          Failed - K in normal range and within 180 days    Potassium  Date Value Ref Range Status  01/31/2021 4.5 3.5 - 5.3 mmol/L Final  05/25/2012 4.1 3.5 - 5.1 mmol/L Final         Failed - Na in normal range and within 180 days    Sodium  Date Value Ref Range Status  01/31/2021 143 135 - 146 mmol/L Final  04/28/2016 143 134 - 144 mmol/L Final  05/25/2012 140 136 - 145 mmol/L Final         Passed - Last BP in normal range    BP Readings from Last 1 Encounters:  06/17/22 105/74         Passed - Valid encounter within last 6 months    Recent Outpatient Visits          2 weeks ago Sebaceous cyst   [facility] Bo Merino, FNP   5 months ago Recurrent major depressive disorder, in partial remission [facility])   [facility] Serafina Royals F, FNP   5 months ago Recurrent major depressive disorder, in partial remission [facility])   [facility] [place], Kristeen Miss, PA-C   9 months ago Obesity (BMI 30.0-34.9)   [facility] Bo Merino, FNP   9 months ago Viral upper respiratory tract infection   [facility] Bo Merino, FNP      Future Appointments            In 4 weeks [facility], [place]            . rosuvastatin (CRESTOR) 10 MG tablet [Pharmacy Med Name: ROSUVASTATIN CALCIUM 10 MG TAB]  90 tablet 0    Sig: TAKE 1 TABLET BY MOUTH EVERY DAY     Cardiovascular:  Antilipid - Statins 2 Failed - 06/29/2022 12:27 PM      Failed - Cr in normal range and within 360 days    Creat  Date Value Ref Range Status  01/31/2021 0.80 0.50 - 0.99 mg/dL Final    Comment:    For patients >16 years of age, the reference limit for Creatinine is approximately 13% higher for people identified as African-American. .          Failed - Lipid Panel in normal range within the last 12 months    Cholesterol, Total  Date Value Ref Range Status  04/28/2016 127 100 - 199 mg/dL Final   Cholesterol  Date Value Ref Range Status  01/31/2021 113 <200 mg/dL Final   LDL Cholesterol (Calc)  Date Value Ref Range Status  01/31/2021 49 mg/dL (calc) Final    Comment:    Reference range: <100 . Desirable range <100 mg/dL for primary prevention;   <70 mg/dL for patients  with CHD or diabetic patients  with > or = 2 CHD risk factors. Marland Kitchen LDL-C is now calculated using the Martin-Hopkins  calculation, which is a validated novel method providing  better accuracy than the Friedewald equation in the  estimation of LDL-C.  Cresenciano Genre et al. Annamaria Helling. 0539;767(34): 2061-2068  (http://education.[facility].com/faq/FAQ164)    HDL  Date Value Ref Range Status  01/31/2021 31 (L) > OR = 50 mg/dL Final  04/28/2016 37 (L) >39 mg/dL Final   Triglycerides  Date Value Ref Range Status  01/31/2021 314 (H) <150 mg/dL Final    Comment:    . If a non-fasting specimen was collected, consider repeat triglyceride testing on a fasting specimen if clinically indicated.  Yates Decamp et al. J. of Clin. Lipidol. 1937;9:024-097. Marland Kitchen          Passed - Patient is not pregnant      Passed - Valid encounter within last 12 months    Recent Outpatient Visits          2 weeks ago Sebaceous cyst   [facility] Serafina Royals F, FNP   5 months ago Recurrent major depressive disorder, in partial remission  [facility])   [facility] Serafina Royals F, FNP   5 months ago Recurrent major depressive disorder, in partial remission [facility])   [facility] Delsa Grana, PA-C   9 months ago Obesity (BMI 30.0-34.9)   [facility] Bo Merino, FNP   9 months ago Viral upper respiratory tract infection   [facility] Bo Merino, FNP      Future Appointments            In 4 weeks [facility], [facility]

## 2022-07-17 DIAGNOSIS — R259 Unspecified abnormal involuntary movements: Secondary | ICD-10-CM | POA: Diagnosis not present

## 2022-07-17 DIAGNOSIS — M542 Cervicalgia: Secondary | ICD-10-CM | POA: Diagnosis not present

## 2022-07-17 DIAGNOSIS — M5442 Lumbago with sciatica, left side: Secondary | ICD-10-CM | POA: Diagnosis not present

## 2022-07-17 DIAGNOSIS — Z79899 Other long term (current) drug therapy: Secondary | ICD-10-CM | POA: Diagnosis not present

## 2022-07-24 ENCOUNTER — Other Ambulatory Visit: Payer: Self-pay | Admitting: Nurse Practitioner

## 2022-07-25 NOTE — Telephone Encounter (Signed)
Requested medications are due for refill today.  yes  Requested medications are on the active medications list.  yes  Last refill. 04/11/2022 #90 0 rf  Future visit scheduled.   yes  Notes to clinic.  Labs are expired.    Requested Prescriptions  Pending Prescriptions Disp Refills   valsartan (DIOVAN) 80 MG tablet [Pharmacy Med Name: VALSARTAN 80 MG TABLET] 90 tablet 0    Sig: TAKE 1 TABLET BY MOUTH EVERY DAY     Cardiovascular:  Angiotensin Receptor Blockers Failed - 07/24/2022  1:14 AM      Failed - Cr in normal range and within 180 days    Creat  Date Value Ref Range Status  01/31/2021 0.80 0.50 - 0.99 mg/dL Final    Comment:    For patients >25 years of age, the reference limit for Creatinine is approximately 13% higher for people identified as African-American. .          Failed - K in normal range and within 180 days    Potassium  Date Value Ref Range Status  01/31/2021 4.5 3.5 - 5.3 mmol/L Final  05/25/2012 4.1 3.5 - 5.1 mmol/L Final         Passed - Patient is not pregnant      Passed - Last BP in normal range    BP Readings from Last 1 Encounters:  06/17/22 105/74         Passed - Valid encounter within last 6 months    Recent Outpatient Visits           1 month ago Sebaceous cyst   [facility] Serafina Royals F, FNP   5 months ago Recurrent major depressive disorder, in partial remission [facility])   [facility] Serafina Royals F, FNP   6 months ago Recurrent major depressive disorder, in partial remission [facility])   [facility] [place], Kristeen Miss, PA-C   9 months ago Obesity (BMI 30.0-34.9)   [facility] Bo Merino, FNP   10 months ago Viral upper respiratory tract infection   [facility] Bo Merino, FNP       Future Appointments             In 4 days [facility], [facility]

## 2022-07-29 ENCOUNTER — Ambulatory Visit (INDEPENDENT_AMBULATORY_CARE_PROVIDER_SITE_OTHER): Payer: Medicare HMO

## 2022-07-29 NOTE — Patient Instructions (Signed)

## 2022-07-29 NOTE — Progress Notes (Signed)
Open in error. The pt was scheduled for a Medicare Wellness Appt and [employer].

## 2022-08-02 ENCOUNTER — Other Ambulatory Visit: Payer: Self-pay | Admitting: Podiatry

## 2022-09-02 ENCOUNTER — Other Ambulatory Visit: Payer: Self-pay | Admitting: Specialist

## 2022-09-02 DIAGNOSIS — Z1231 Encounter for screening mammogram for malignant neoplasm of breast: Secondary | ICD-10-CM

## 2022-09-21 ENCOUNTER — Other Ambulatory Visit: Payer: Self-pay | Admitting: Podiatry

## 2022-09-26 ENCOUNTER — Other Ambulatory Visit: Payer: Self-pay | Admitting: Nurse Practitioner

## 2022-09-26 DIAGNOSIS — I10 Essential (primary) hypertension: Secondary | ICD-10-CM

## 2022-09-26 NOTE — Telephone Encounter (Signed)
Requested medications are due for refill today.  yes  Requested medications are on the active medications list.  yes  Last refill. 06/30/2022 #90 0 rf  Future visit scheduled.   no  Notes to clinic.  Labs are expired.    Requested Prescriptions  Pending Prescriptions Disp Refills   hydrochlorothiazide (HYDRODIURIL) 12.5 MG tablet [Pharmacy Med Name: HYDROCHLOROTHIAZIDE 12.5 MG TB] 90 tablet 0    Sig: TAKE 1 TABLET BY MOUTH EVERY DAY     Cardiovascular: Diuretics - Thiazide Failed - 09/26/2022  1:36 AM      Failed - Cr in normal range and within 180 days    Creat  Date Value Ref Range Status  01/31/2021 0.80 0.50 - 0.99 mg/dL Final    Comment:    For patients >72 years of age, the reference limit for Creatinine is approximately 13% higher for people identified as African-American. .          Failed - K in normal range and within 180 days    Potassium  Date Value Ref Range Status  01/31/2021 4.5 3.5 - 5.3 mmol/L Final  05/25/2012 4.1 3.5 - 5.1 mmol/L Final         Failed - Na in normal range and within 180 days    Sodium  Date Value Ref Range Status  01/31/2021 143 135 - 146 mmol/L Final  04/28/2016 143 134 - 144 mmol/L Final  05/25/2012 140 136 - 145 mmol/L Final         Passed - Last BP in normal range    BP Readings from Last 1 Encounters:  06/17/22 105/74         Passed - Valid encounter within last 6 months    Recent Outpatient Visits           3 months ago Sebaceous cyst   [facility] Bo Merino, FNP   7 months ago Recurrent major depressive disorder, in partial remission [facility])   [facility] Serafina Royals F, FNP   8 months ago Recurrent major depressive disorder, in partial remission [facility])   [facility] [place], Kristeen Miss, PA-C   12 months ago Obesity (BMI 30.0-34.9)   [facility] Bo Merino, FNP   1 year ago Viral upper respiratory tract  infection   [facility] Bo Merino, [place]

## 2022-09-29 ENCOUNTER — Other Ambulatory Visit: Payer: Self-pay | Admitting: Nurse Practitioner

## 2022-09-29 DIAGNOSIS — E782 Mixed hyperlipidemia: Secondary | ICD-10-CM

## 2022-09-29 NOTE — Telephone Encounter (Signed)
Requested medication (s) are due for refill today - yes  Requested medication (s) are on the active medication list -yes  Future visit scheduled -no  Last refill: 10/30/230 #30  Notes to clinic: [place] lab protocol- over 1 year 01/31/21  Requested Prescriptions  Pending Prescriptions Disp Refills   rosuvastatin (CRESTOR) 10 MG tablet [Pharmacy Med Name: ROSUVASTATIN CALCIUM 10 MG TAB] 90 tablet 0    Sig: TAKE 1 TABLET BY MOUTH EVERY DAY     Cardiovascular:  Antilipid - Statins 2 Failed - 09/29/2022  1:34 AM      Failed - Cr in normal range and within 360 days    Creat  Date Value Ref Range Status  01/31/2021 0.80 0.50 - 0.99 mg/dL Final    Comment:    For patients >61 years of age, the reference limit for Creatinine is approximately 13% higher for people identified as African-American. .          Failed - Lipid Panel in normal range within the last 12 months    Cholesterol, Total  Date Value Ref Range Status  04/28/2016 127 100 - 199 mg/dL Final   Cholesterol  Date Value Ref Range Status  01/31/2021 113 <200 mg/dL Final   LDL Cholesterol (Calc)  Date Value Ref Range Status  01/31/2021 49 mg/dL (calc) Final    Comment:    Reference range: <100 . Desirable range <100 mg/dL for primary prevention;   <70 mg/dL for patients with CHD or diabetic patients  with > or = 2 CHD risk factors. Marland Kitchen LDL-C is now calculated using the Martin-Hopkins  calculation, which is a validated novel method providing  better accuracy than the Friedewald equation in the  estimation of LDL-C.  Cresenciano Genre et al. Annamaria Helling. 5027;741(28): 2061-2068  (http://education.[facility].com/faq/FAQ164)    HDL  Date Value Ref Range Status  01/31/2021 31 (L) > OR = 50 mg/dL Final  04/28/2016 37 (L) >39 mg/dL Final   Triglycerides  Date Value Ref Range Status  01/31/2021 314 (H) <150 mg/dL Final    Comment:    . If a non-fasting specimen was collected, consider repeat triglyceride testing on a  fasting specimen if clinically indicated.  Yates Decamp et al. J. of Clin. Lipidol. 7867;6:720-947. Marland Kitchen          Passed - Patient is not pregnant      Passed - Valid encounter within last 12 months    Recent Outpatient Visits           3 months ago Sebaceous cyst   [facility] Serafina Royals F, FNP   8 months ago Recurrent major depressive disorder, in partial remission [facility])   [facility] Serafina Royals F, FNP   8 months ago Recurrent major depressive disorder, in partial remission [facility])   [facility] Delsa Grana, PA-C   12 months ago Obesity (BMI 30.0-34.9)   [facility] Bo Merino, FNP   1 year ago Viral upper respiratory tract infection   [facility] Bo Merino, [place]                 Requested Prescriptions  Pending Prescriptions Disp Refills   rosuvastatin (CRESTOR) 10 MG tablet [Pharmacy Med Name: ROSUVASTATIN CALCIUM 10 MG TAB] 90 tablet 0    Sig: TAKE 1 TABLET BY MOUTH EVERY DAY     Cardiovascular:  Antilipid - Statins 2 Failed - 09/29/2022  1:34 AM  Failed - Cr in normal range and within 360 days    Creat  Date Value Ref Range Status  01/31/2021 0.80 0.50 - 0.99 mg/dL Final    Comment:    For patients >24 years of age, the reference limit for Creatinine is approximately 13% higher for people identified as African-American. .          Failed - Lipid Panel in normal range within the last 12 months    Cholesterol, Total  Date Value Ref Range Status  04/28/2016 127 100 - 199 mg/dL Final   Cholesterol  Date Value Ref Range Status  01/31/2021 113 <200 mg/dL Final   LDL Cholesterol (Calc)  Date Value Ref Range Status  01/31/2021 49 mg/dL (calc) Final    Comment:    Reference range: <100 . Desirable range <100 mg/dL for primary prevention;   <70 mg/dL for patients with CHD or diabetic patients  with > or = 2  CHD risk factors. Marland Kitchen LDL-C is now calculated using the Martin-Hopkins  calculation, which is a validated novel method providing  better accuracy than the Friedewald equation in the  estimation of LDL-C.  Cresenciano Genre et al. Annamaria Helling. 1572;620(35): 2061-2068  (http://education.[facility].com/faq/FAQ164)    HDL  Date Value Ref Range Status  01/31/2021 31 (L) > OR = 50 mg/dL Final  04/28/2016 37 (L) >39 mg/dL Final   Triglycerides  Date Value Ref Range Status  01/31/2021 314 (H) <150 mg/dL Final    Comment:    . If a non-fasting specimen was collected, consider repeat triglyceride testing on a fasting specimen if clinically indicated.  Yates Decamp et al. J. of Clin. Lipidol. 5974;1:638-453. Marland Kitchen          Passed - Patient is not pregnant      Passed - Valid encounter within last 12 months    Recent Outpatient Visits           3 months ago Sebaceous cyst   [facility] Serafina Royals F, FNP   8 months ago Recurrent major depressive disorder, in partial remission [facility])   [facility] Serafina Royals F, FNP   8 months ago Recurrent major depressive disorder, in partial remission [facility])   [facility] Delsa Grana, PA-C   12 months ago Obesity (BMI 30.0-34.9)   [facility] Bo Merino, FNP   1 year ago Viral upper respiratory tract infection   [facility] Bo Merino, [place]

## 2022-09-30 ENCOUNTER — Other Ambulatory Visit: Payer: Self-pay | Admitting: Internal Medicine

## 2022-09-30 ENCOUNTER — Other Ambulatory Visit: Payer: Self-pay

## 2022-10-02 NOTE — Telephone Encounter (Signed)
Pt has not followed up recently

## 2022-10-06 ENCOUNTER — Other Ambulatory Visit: Payer: Self-pay | Admitting: Family Medicine

## 2022-10-06 DIAGNOSIS — Z1231 Encounter for screening mammogram for malignant neoplasm of breast: Secondary | ICD-10-CM

## 2022-10-28 ENCOUNTER — Other Ambulatory Visit: Payer: Self-pay | Admitting: Podiatry

## 2022-11-05 ENCOUNTER — Ambulatory Visit
Admission: RE | Admit: 2022-11-05 | Discharge: 2022-11-05 | Disposition: A | Discharge status: Home / Self Care | Payer: Medicare HMO | Source: Ambulatory Visit | Attending: Family Medicine | Admitting: Family Medicine

## 2022-11-05 DIAGNOSIS — Z1231 Encounter for screening mammogram for malignant neoplasm of breast: Secondary | ICD-10-CM

## 2022-11-17 ENCOUNTER — Ambulatory Visit: Payer: Medicare HMO

## 2022-11-25 ENCOUNTER — Other Ambulatory Visit: Payer: Self-pay | Admitting: Podiatry

## 2022-12-01 ENCOUNTER — Other Ambulatory Visit: Payer: Self-pay | Admitting: Nurse Practitioner

## 2022-12-02 ENCOUNTER — Other Ambulatory Visit: Payer: Self-pay | Admitting: Nurse Practitioner

## 2022-12-02 DIAGNOSIS — J301 Allergic rhinitis due to pollen: Secondary | ICD-10-CM

## 2022-12-02 DIAGNOSIS — I10 Essential (primary) hypertension: Secondary | ICD-10-CM

## 2022-12-02 NOTE — Telephone Encounter (Signed)
Requested medications are due for refill today.  yes  Requested medications are on the active medications list.  yes  Last refill. varied  Future visit scheduled.   no  Notes to clinic.  Labs are expired.    Requested Prescriptions  Pending Prescriptions Disp Refills   loratadine (CLARITIN) 10 MG tablet [Pharmacy Med Name: LORATADINE 10 MG TABLET] 90 tablet 3    Sig: TAKE 1 TABLET BY MOUTH EVERY DAY     Ear, Nose, and Throat:  Antihistamines 2 Failed - 12/02/2022  3:42 PM      Failed - Cr in normal range and within 360 days    Creat  Date Value Ref Range Status  01/31/2021 0.80 0.50 - 0.99 mg/dL Final    Comment:    For patients >43 years of age, the reference limit for Creatinine is approximately 13% higher for people identified as African-American. [name] - Valid encounter within last 12 months    Recent Outpatient Visits           5 months ago Sebaceous cyst   [facility] Serafina Royals F, FNP   10 months ago Recurrent major depressive disorder, in partial remission [facility])   [facility] Bo Merino, FNP   11 months ago Recurrent major depressive disorder, in partial remission [facility])   [facility] Delsa Grana, PA-C   1 year ago Obesity (BMI 30.0-34.9)   [facility] Serafina Royals F, FNP   1 year ago Viral upper respiratory tract infection   [facility] Bo Merino, FNP               hydrochlorothiazide (HYDRODIURIL) 12.5 MG tablet [Pharmacy Med Name: HYDROCHLOROTHIAZIDE 12.5 MG TB] 90 tablet 0    Sig: TAKE 1 TABLET BY MOUTH EVERY DAY     Cardiovascular: Diuretics - Thiazide Failed - 12/02/2022  3:42 PM      Failed - Cr in normal range and within 180 days    Creat  Date Value Ref Range Status  01/31/2021 0.80 0.50 - 0.99 mg/dL Final    Comment:    For patients >14 years of age, the reference limit for  Creatinine is approximately 13% higher for people identified as African-American. .          Failed - K in normal range and within 180 days    Potassium  Date Value Ref Range Status  01/31/2021 4.5 3.5 - 5.3 mmol/L Final  05/25/2012 4.1 3.5 - 5.1 mmol/L Final         Failed - Na in normal range and within 180 days    Sodium  Date Value Ref Range Status  01/31/2021 143 135 - 146 mmol/L Final  04/28/2016 143 134 - 144 mmol/L Final  05/25/2012 140 136 - 145 mmol/L Final         Passed - Last BP in normal range    BP Readings from Last 1 Encounters:  06/17/22 105/74         Passed - Valid encounter within last 6 months    Recent Outpatient Visits           5 months ago Sebaceous cyst   [facility] Bo Merino, FNP   10 months ago Recurrent major depressive disorder, in partial remission [facility])   [place],  Myna Hidalgo, FNP   11 months ago Recurrent major depressive disorder, in partial remission [facility])   [facility] [place], Kristeen Miss, PA-C   1 year ago Obesity (BMI 30.0-34.9)   [facility] Bo Merino, FNP   1 year ago Viral upper respiratory tract infection   [facility] Bo Merino, [place]

## 2022-12-02 NOTE — Telephone Encounter (Signed)
Requested medication (s) are due for refill today - yes  Requested medication (s) are on the active medication list -yes  Future visit scheduled -no  Last refill: 03/05/22 #90 1RF  Notes to clinic: fails lab protocol- over 1 year 01/31/21  Requested Prescriptions  Pending Prescriptions Disp Refills   clopidogrel (PLAVIX) 75 MG tablet [Pharmacy Med Name: CLOPIDOGREL 75 MG TABLET] 90 tablet 1    Sig: TAKE 1 TABLET BY [name]     Hematology: Antiplatelets - clopidogrel Failed - 12/01/2022  5:38 PM      Failed - HCT in normal range and within 180 days    HCT  Date Value Ref Range Status  01/31/2021 37.8 35.0 - 45.0 % Final   Hematocrit  Date Value Ref Range Status  10/09/2015 41.5 34.0 - 46.6 % Final         Failed - HGB in normal range and within 180 days    Hemoglobin  Date Value Ref Range Status  01/31/2021 12.8 11.7 - 15.5 g/dL Final  10/09/2015 14.3 11.1 - 15.9 g/dL Final         Failed - PLT in normal range and within 180 days    Platelets  Date Value Ref Range Status  01/31/2021 256 140 - 400 Thousand/uL Final  10/09/2015 324 150 - 379 x10E3/uL Final         Failed - Cr in normal range and within 360 days    Creat  Date Value Ref Range Status  01/31/2021 0.80 0.50 - 0.99 mg/dL Final    Comment:    For patients >55 years of age, the reference limit for Creatinine is approximately 13% higher for people identified as African-American. Robin Bryan - Valid encounter within last 6 months    Recent Outpatient Visits           5 months ago Sebaceous cyst   [facility] Royals F, FNP   10 months ago Recurrent major depressive disorder, in partial remission [facility])   [facility] Robin Merino, FNP   11 months ago Recurrent major depressive disorder, in partial remission [facility])   [facility] Robin Grana, PA-C   1 year ago Obesity (BMI 30.0-34.9)   [facility] Merino, FNP   1 year ago Viral upper respiratory tract infection   [facility] Robin Bryan, [name]              Signed Prescriptions Disp Refills   escitalopram (LEXAPRO) 20 MG tablet 90 tablet 0    Sig: TAKE 1 TABLET BY MOUTH EVERY DAY     Psychiatry:  Antidepressants - SSRI Passed - 12/01/2022  5:38 PM      Passed - Completed PHQ-2 or PHQ-9 in the last 360 days      Passed - Valid encounter within last 6 months    Recent Outpatient Visits           5 months ago Sebaceous cyst   [facility] Robin Merino, FNP   10 months ago Recurrent major depressive disorder, in partial remission [facility])   [facility], FNP   11 months ago Recurrent major depressive disorder, in partial remission [facility])   [facility] Grana, PA-C   1 year ago Obesity (BMI  30.0-34.9)   [facility] Royals F, FNP   1 year ago Viral upper respiratory tract infection   [facility] Robin Bryan, [place]                 Requested Prescriptions  Pending Prescriptions Disp Refills   clopidogrel (PLAVIX) 75 MG tablet [Pharmacy Med Name: CLOPIDOGREL 75 MG TABLET] 90 tablet 1    Sig: TAKE 1 TABLET BY MOUTH EVERY DAY     Hematology: Antiplatelets - clopidogrel Failed - 12/01/2022  5:38 PM      Failed - HCT in normal range and within 180 days    HCT  Date Value Ref Range Status  01/31/2021 37.8 35.0 - 45.0 % Final   Hematocrit  Date Value Ref Range Status  10/09/2015 41.5 34.0 - 46.6 % Final         Failed - HGB in normal range and within 180 days    Hemoglobin  Date Value Ref Range Status  01/31/2021 12.8 11.7 - 15.5 g/dL Final  10/09/2015 14.3 11.1 - 15.9 g/dL Final         Failed - PLT in normal range and within 180 days    Platelets  Date Value Ref Range Status   01/31/2021 256 140 - 400 Thousand/uL Final  10/09/2015 324 150 - 379 x10E3/uL Final         Failed - Cr in normal range and within 360 days    Creat  Date Value Ref Range Status  01/31/2021 0.80 0.50 - 0.99 mg/dL Final    Comment:    For patients >69 years of age, the reference limit for Creatinine is approximately 13% higher for people identified as African-American. Robin Bryan - Valid encounter within last 6 months    Recent Outpatient Visits           5 months ago Sebaceous cyst   [name] Robin Royals F, FNP   10 months ago Recurrent major depressive disorder, in partial remission [facility])   [facility] Merino, FNP   11 months ago Recurrent major depressive disorder, in partial remission [facility])   [facility] Grana, PA-C   1 year ago Obesity (BMI 30.0-34.9)   [facility] Merino, FNP   1 year ago Viral upper respiratory tract infection   [name] Robin Bryan, [place]              Signed Prescriptions Disp Refills   escitalopram (LEXAPRO) 20 MG tablet 90 tablet 0    Sig: TAKE 1 TABLET BY MOUTH EVERY DAY     Psychiatry:  Antidepressants - SSRI Passed - 12/01/2022  5:38 PM      Passed - Completed PHQ-2 or PHQ-9 in the last 360 days      Passed - Valid encounter within last 6 months    Recent Outpatient Visits           5 months ago Sebaceous cyst   [facility] Robin Merino, FNP   10 months ago Recurrent major depressive disorder, in partial remission [facility])   [facility], FNP   11 months ago Recurrent major depressive disorder, in partial remission [facility])   [facility], [place]  1 year ago Obesity (BMI 30.0-34.9)   [facility] Merino, FNP   1 year ago  Viral upper respiratory tract infection   [facility] Robin Bryan, [place]

## 2022-12-02 NOTE — Telephone Encounter (Signed)
Requested Prescriptions  Pending Prescriptions Disp Refills   escitalopram (LEXAPRO) 20 MG tablet [Pharmacy Med Name: ESCITALOPRAM 20 MG TABLET] 90 tablet 0    Sig: TAKE 1 TABLET BY MOUTH EVERY DAY     Psychiatry:  Antidepressants - SSRI Passed - 12/01/2022  5:38 PM      Passed - Completed PHQ-2 or PHQ-9 in the last 360 days      Passed - Valid encounter within last 6 months    Recent Outpatient Visits           5 months ago Sebaceous cyst   [facility] Bo Merino, FNP   10 months ago Recurrent major depressive disorder, in partial remission [facility])   [facility] Bo Merino, FNP   11 months ago Recurrent major depressive disorder, in partial remission [facility])   [facility] Delsa Grana, PA-C   1 year ago Obesity (BMI 30.0-34.9)   [facility] Serafina Royals F, FNP   1 year ago Viral upper respiratory tract infection   [facility] Bo Merino, FNP               clopidogrel (PLAVIX) 75 MG tablet [Pharmacy Med Name: CLOPIDOGREL 75 MG TABLET] 90 tablet 1    Sig: TAKE 1 TABLET BY MOUTH EVERY DAY     Hematology: Antiplatelets - clopidogrel Failed - 12/01/2022  5:38 PM      Failed - HCT in normal range and within 180 days    HCT  Date Value Ref Range Status  01/31/2021 37.8 35.0 - 45.0 % Final   Hematocrit  Date Value Ref Range Status  10/09/2015 41.5 34.0 - 46.6 % Final         Failed - HGB in normal range and within 180 days    Hemoglobin  Date Value Ref Range Status  01/31/2021 12.8 11.7 - 15.5 g/dL Final  10/09/2015 14.3 11.1 - 15.9 g/dL Final         Failed - PLT in normal range and within 180 days    Platelets  Date Value Ref Range Status  01/31/2021 256 140 - 400 Thousand/uL Final  10/09/2015 324 150 - 379 x10E3/uL Final         Failed - Cr in normal range and within 360 days    Creat  Date Value Ref Range Status   01/31/2021 0.80 0.50 - 0.99 mg/dL Final    Comment:    For patients >26 years of age, the reference limit for Creatinine is approximately 13% higher for people identified as African-American. [name] - Valid encounter within last 6 months    Recent Outpatient Visits           5 months ago Sebaceous cyst   [facility] Serafina Royals F, FNP   10 months ago Recurrent major depressive disorder, in partial remission [facility])   [facility] Bo Merino, FNP   11 months ago Recurrent major depressive disorder, in partial remission [facility])   [facility] Delsa Gr[facility], PA-C   1 year ago Obesity (BMI 30.0-34.9)   [facility] Bo Merino, FNP   1 year ago Viral upper respiratory tract infection   [facility] Bo Merino, [place]

## 2022-12-03 NOTE — Telephone Encounter (Signed)
Claritin has already been sent

## 2022-12-22 ENCOUNTER — Other Ambulatory Visit: Payer: Self-pay | Admitting: Nurse Practitioner

## 2022-12-23 NOTE — Telephone Encounter (Signed)
Requested medication (s) are due for refill today - yes  Requested medication (s) are on the active medication list -yes  Future visit scheduled -no  Last refill: 07/31/22 #90  Notes to clinic: fails lab protocol- over 1 year-01/31/21  Requested Prescriptions  Pending Prescriptions Disp Refills   valsartan (DIOVAN) 80 MG tablet [Pharmacy Med Name: VALSARTAN 80 MG TABLET] 90 tablet 0    Sig: TAKE 1 TABLET BY MOUTH EVERY DAY     Cardiovascular:  Angiotensin Receptor Blockers Failed - 12/22/2022 12:26 PM      Failed - Cr in normal range and within 180 days    Creat  Date Value Ref Range Status  01/31/2021 0.80 0.50 - 0.99 mg/dL Final    Comment:    For patients >63 years of age, the reference limit for Creatinine is approximately 13% higher for people identified as African-American. .          Failed - K in normal range and within 180 days    Potassium  Date Value Ref Range Status  01/31/2021 4.5 3.5 - 5.3 mmol/L Final  05/25/2012 4.1 3.5 - 5.1 mmol/L Final         Passed - Patient is not pregnant      Passed - Last BP in normal range    BP Readings from Last 1 Encounters:  06/17/22 105/74         Passed - Valid encounter within last 6 months    Recent Outpatient Visits           6 months ago Sebaceous cyst   [facility] Berniece Salines, FNP   10 months ago Recurrent major depressive disorder, in partial remission [facility])   [facility] Berniece Salines, FNP   11 months ago Recurrent major depressive disorder, in partial remission [facility])   [facility] Danelle Berry, PA-C   1 year ago Obesity (BMI 30.0-34.9)    [facility] Della Goo F, FNP   1 year ago Viral upper respiratory tract infection   [facility] Berniece Salines, [place]                 Requested Prescriptions  Pending Prescriptions Disp Refills   valsartan (DIOVAN) 80  MG tablet [Pharmacy Med Name: VALSARTAN 80 MG TABLET] 90 tablet 0    Sig: TAKE 1 TABLET BY MOUTH EVERY DAY     Cardiovascular:  Angiotensin Receptor Blockers Failed - 12/22/2022 12:26 PM      Failed - Cr in normal range and within 180 days    Creat  Date Value Ref Range Status  01/31/2021 0.80 0.50 - 0.99 mg/dL Final    Comment:    For patients >28 years of age, the reference limit for Creatinine is approximately 13% higher for people identified as African-American. .          Failed - K in normal range and within 180 days    Potassium  Date Value Ref Range Status  01/31/2021 4.5 3.5 - 5.3 mmol/L Final  05/25/2012 4.1 3.5 - 5.1 mmol/L Final         Passed - Patient is not pregnant      Passed - Last BP in normal range    BP Readings from Last 1 Encounters:  06/17/22 105/74         Passed - Valid encounter within last 6 months    Recent  Outpatient Visits           6 months ago Sebaceous cyst   [facility] Della Goo F, FNP   10 months ago Recurrent major depressive disorder, in partial remission [facility])   [facility] Berniece Salines, FNP   11 months ago Recurrent major depressive disorder, in partial remission [facility])   [facility] Danelle Berry, PA-C   1 year ago Obesity (BMI 30.0-34.9)   [facility] Berniece Salines, FNP   1 year ago Viral upper respiratory tract infection   [facility] Berniece Salines, [place]

## 2022-12-25 ENCOUNTER — Other Ambulatory Visit: Payer: Self-pay

## 2023-01-13 ENCOUNTER — Telehealth: Payer: Self-pay

## 2023-01-13 NOTE — Telephone Encounter (Signed)
Spoke with patient and let her know she needed a follow up appt as well as visit for paperwork completion for insurance. She agreed and was transferred to [facility] for scheduling.

## 2023-01-13 NOTE — Telephone Encounter (Signed)
Copied from CRM [id number]. Topic: General - Other >> Jan 12, 2023  3:42 PM Clide Dales wrote: Patient is requesting a callback from the office. Patient would like to discuss her diabetic medical records.

## 2023-01-14 ENCOUNTER — Ambulatory Visit (INDEPENDENT_AMBULATORY_CARE_PROVIDER_SITE_OTHER): Payer: Medicare HMO | Admitting: Nurse Practitioner

## 2023-01-14 ENCOUNTER — Encounter: Payer: Self-pay | Admitting: Nurse Practitioner

## 2023-01-14 ENCOUNTER — Other Ambulatory Visit: Payer: Self-pay

## 2023-01-14 VITALS — BP 132/74 | HR 77 | Temp 97.9°F | Resp 16 | Ht 68.0 in | Wt 215.9 lb

## 2023-01-14 DIAGNOSIS — E7800 Pure hypercholesterolemia, unspecified: Secondary | ICD-10-CM | POA: Diagnosis not present

## 2023-01-14 DIAGNOSIS — F3341 Major depressive disorder, recurrent, in partial remission: Secondary | ICD-10-CM | POA: Diagnosis not present

## 2023-01-14 DIAGNOSIS — K219 Gastro-esophageal reflux disease without esophagitis: Secondary | ICD-10-CM

## 2023-01-14 DIAGNOSIS — I10 Essential (primary) hypertension: Secondary | ICD-10-CM | POA: Diagnosis not present

## 2023-01-14 DIAGNOSIS — I459 Conduction disorder, unspecified: Secondary | ICD-10-CM | POA: Insufficient documentation

## 2023-01-14 DIAGNOSIS — E669 Obesity, unspecified: Secondary | ICD-10-CM

## 2023-01-14 DIAGNOSIS — J4520 Mild intermittent asthma, uncomplicated: Secondary | ICD-10-CM | POA: Diagnosis not present

## 2023-01-14 DIAGNOSIS — Z130 Encounter for screening for diseases of the blood and blood-forming organs and certain disorders involving the immune mechanism: Secondary | ICD-10-CM

## 2023-01-14 DIAGNOSIS — R7303 Prediabetes: Secondary | ICD-10-CM

## 2023-01-14 MED ORDER — VALSARTAN 80 MG PO TABS: 80.0000 mg | ORAL_TABLET | Freq: Every day | ORAL | 1 refills | Status: DC

## 2023-01-14 NOTE — Assessment & Plan Note (Signed)
Patient is doing well on Lexapro 20 daily.  Will continue with current dose.

## 2023-01-14 NOTE — Assessment & Plan Note (Signed)
Patient's last cholesterol was checked in 2022.  She is currently taking rosuvastatin 10 mg daily.  Will get labs today.

## 2023-01-14 NOTE — Assessment & Plan Note (Signed)
Patient currently takes valsartan 80 mg daily, hydrochlorothiazide 12.5 mg daily and propranolol 60 mg daily.  Will current treatment plan.

## 2023-01-14 NOTE — Assessment & Plan Note (Signed)
Recommend working on lifestyle modification including eating a well-balanced diet with portion control and increasing physical activity as tolerated.

## 2023-01-14 NOTE — Assessment & Plan Note (Signed)
Reports her breathing has been good lately.  She says she does occasionally use her albuterol inhaler but not often.

## 2023-01-14 NOTE — Assessment & Plan Note (Signed)
Pantoprazole 40 mg 2 times a day.  She reports she is doing well.  Will continue with current treatment.

## 2023-01-14 NOTE — Assessment & Plan Note (Signed)
Currently takes metformin 500 mg daily.  Her last A1c was in normal range.  We will get labs today.

## 2023-01-14 NOTE — Assessment & Plan Note (Signed)
Reports she has had skipped beats for many years.  She says its never been an issue.  But it is noticed on exam.

## 2023-01-14 NOTE — Progress Notes (Signed)
BP 132/74   Pulse 77   Temp 97.9 F (36.6 C) (Oral)   Resp 16   Ht 5\' 8"  (1.727 m)   Wt 215 lb 14.4 oz (97.9 kg)   SpO2 99%   BMI 32.83 kg/m    Subjective:    Patient ID: Robin Bryan, female    DOB: 04-18-1951, 72 y.o.   MRN: [id number]  HPI: Robin Bryan is a 72 y.o. female  Chief Complaint  Patient presents with   Consult    Discuss paperwork for new insurance   Depression: she currently takes lexapro 20 mg daily. She reports she is doing well.     01/14/2023   11:43 AM 01/14/2023   11:25 AM 06/10/2022    3:11 PM 01/31/2022    2:47 PM 01/03/2022    3:07 PM  Depression screen PHQ 2/9  Decreased Interest 0 0 0 0 1  Down, Depressed, Hopeless 0 0 0 0 2  PHQ - 2 Score 0 0 0 0 3  Altered sleeping 0   2 0  Tired, decreased energy 0   0 2  Change in appetite 0   0 2  Feeling bad or failure about yourself  0   0 0  Trouble concentrating 0   0 0  Moving slowly or fidgety/restless 0   0 0  Suicidal thoughts 0    0  PHQ-9 Score 0   2 7  Difficult doing work/chores Not difficult at all    Not difficult at all    Asthma: she says that her breathing has been good. She says she occasionally uses her  albuterol inhaler but not often.   HTN: Patient currently takes valsartan 80 mg daily, hydrochlorothiazide 12.5 mg daily, propanolol 60 mg daily.  Her blood pressure today is 132/74. She denies any chest pain, shortness of breath, headaches or blurred vision.    Hyperlipidemia: she currently takes rosuvastatin 10 mg daily, she denies any myalgia.  her last LDL was 49 on 01/31/2021. Her triglycerides were elevated 314. Will get labs today.   GERD: she is currently taking pantoprazole 40 mg two times a day. She reports she is doing well.   History of stroke/skipped heart beat:  she reports that she has skipped heart beats.  She denies any chest pain. She says she has had this for a long time.  she reports that her last stoke was in 2006. She currently takes plavix 75 mg daily.  She reports she is doing well.   Prediabetes:  she is currently taking metformin 500 mg daily.  She reports she is doing well. Last A1C was in the normal range.  Will get labs today.   Obesity: Patient's weight today is 215 pounds BMI of 32.83.  Recommend working on lifestyle modification including eating a well-balanced diet with portion control and increasing physical activity as tolerated.  Relevant past medical, surgical, family and social history reviewed and updated as indicated. Interim medical history since our last visit reviewed. Allergies and medications reviewed and updated.  Review of Systems  Constitutional: Negative for fever or weight change.  Respiratory: Negative for cough and shortness of breath.   Cardiovascular: Negative for chest pain or palpitations.  Gastrointestinal: Negative for abdominal pain, no bowel changes.  Musculoskeletal: Negative for gait problem or joint swelling.  Skin: Negative for rash.  Neurological: Negative for dizziness or headache.  No other specific complaints in a complete review of systems (except as listed in HPI  above).      Objective:    BP 132/74   Pulse 77   Temp 97.9 F (36.6 C) (Oral)   Resp 16   Ht 5\' 8"  (1.727 m)   Wt 215 lb 14.4 oz (97.9 kg)   SpO2 99%   BMI 32.83 kg/m   Wt Readings from Last 3 Encounters:  01/14/23 215 lb 14.4 oz (97.9 kg)  06/17/22 220 lb (99.8 kg)  06/10/22 223 lb 3.2 oz (101.2 kg)    Physical Exam  Constitutional: Patient appears well-developed and well-nourished. Obese  No distress.  HEENT: head atraumatic, normocephalic, pupils equal and reactive to light, neck supple, throat within normal limits Cardiovascular: Normal rate, regular rhythm and normal heart sounds.  No murmur heard. No BLE edema. Pulmonary/Chest: Effort normal and breath sounds normal. No respiratory distress. Abdominal: Soft.  There is no tenderness. Psychiatric: Patient has a normal mood and affect. behavior is normal.  Judgment and thought content normal.      Assessment & Plan:   Problem List Items Addressed This Visit       Cardiovascular and Mediastinum   Benign essential HTN    Patient currently takes valsartan 80 mg daily, hydrochlorothiazide 12.5 mg daily and propranolol 60 mg daily.  Will current treatment plan.      Relevant Medications   valsartan (DIOVAN) 80 MG tablet     Respiratory   Mild intermittent asthma without complication    Reports her breathing has been good lately.  She says she does occasionally use her albuterol inhaler but not often.        Digestive   GERD    Pantoprazole 40 mg 2 times a day.  She reports she is doing well.  Will continue with current treatment.        Other   Major depression in partial remission (HCC) - Primary    Patient is doing well on Lexapro 20 daily.  Will continue with current dose.      Pure hypercholesterolemia    Patient's last cholesterol was checked in 2022.  She is currently taking rosuvastatin 10 mg daily.  Will get labs today.      Relevant Medications   valsartan (DIOVAN) 80 MG tablet   Other Relevant Orders   COMPLETE METABOLIC PANEL WITH GFR   Lipid panel   Obesity (BMI 30.0-34.9)    Recommend working on lifestyle modification including eating a well-balanced diet with portion control and increasing physical activity as tolerated.      Prediabetes    Currently takes metformin 500 mg daily.  Her last A1c was in normal range.  We will get labs today.      Relevant Orders   COMPLETE METABOLIC PANEL WITH GFR   Hemoglobin A1c   Skipped heart beats    Reports she has had skipped beats for many years.  She says its never been an issue.  But it is noticed on exam.      Other Visit Diagnoses     Screening for deficiency anemia       Relevant Orders   CBC with Differential/Platelet        Follow up plan: Return in about 6 months (around 07/17/2023) for follow up, needs to schedule awv.

## 2023-01-15 LAB — COMPLETE METABOLIC PANEL WITH GFR
AG Ratio: 2.2 (calc) (ref 1.0–2.5)
ALT: 11 U/L (ref 6–29)
AST: 11 U/L (ref 10–35)
Albumin: 4.4 g/dL (ref 3.6–5.1)
Alkaline phosphatase (APISO): 72 U/L (ref 37–153)
BUN: 14 mg/dL (ref 7–25)
CO2: 24 mmol/L (ref 20–32)
Calcium: 9.1 mg/dL (ref 8.6–10.4)
Chloride: 107 mmol/L (ref 98–110)
Creat: 0.85 mg/dL (ref 0.60–1.00)
Globulin: 2 g/dL (calc) (ref 1.9–3.7)
Glucose, Bld: 99 mg/dL (ref 65–99)
Potassium: 4.4 mmol/L (ref 3.5–5.3)
Sodium: 141 mmol/L (ref 135–146)
Total Bilirubin: 0.4 mg/dL (ref 0.2–1.2)
Total Protein: 6.4 g/dL (ref 6.1–8.1)
eGFR: 74 mL/min/{1.73_m2} (ref >=60)

## 2023-01-15 LAB — LIPID PANEL
Cholesterol: 91 mg/dL (ref <200)
HDL: 32 mg/dL — ABNORMAL LOW (ref >=50)
LDL Cholesterol (Calc): 33 mg/dL (calc)
Non-HDL Cholesterol (Calc): 59 mg/dL (calc) (ref <130)
Total CHOL/HDL Ratio: 2.8 (calc) (ref <5.0)
Triglycerides: 193 mg/dL — ABNORMAL HIGH (ref <150)

## 2023-01-15 LAB — HEMOGLOBIN A1C
Hgb A1c MFr Bld: 5.7 % of total Hgb — ABNORMAL HIGH (ref <5.7)
Mean Plasma Glucose: 117 mg/dL
eAG (mmol/L): 6.5 mmol/L

## 2023-01-15 LAB — CBC WITH DIFFERENTIAL/PLATELET
Absolute Monocytes: 426 cells/uL (ref 200–950)
Basophils Absolute: 71 cells/uL (ref 0–200)
Basophils Relative: 1 %
Eosinophils Absolute: 206 cells/uL (ref 15–500)
Eosinophils Relative: 2.9 %
HCT: 39.3 % (ref 35.0–45.0)
Hemoglobin: 13.5 g/dL (ref 11.7–15.5)
Lymphs Abs: 1711 cells/uL (ref 850–3900)
MCH: 31.1 pg (ref 27.0–33.0)
MCHC: 34.4 g/dL (ref 32.0–36.0)
MCV: 90.6 fL (ref 80.0–100.0)
MPV: 9.1 fL (ref 7.5–12.5)
Monocytes Relative: 6 %
Neutro Abs: 4686 cells/uL (ref 1500–7800)
Neutrophils Relative %: 66 %
Platelets: 275 10*3/uL (ref 140–400)
RBC: 4.34 10*6/uL (ref 3.80–5.10)
RDW: 13.1 % (ref 11.0–15.0)
Total Lymphocyte: 24.1 %
WBC: 7.1 10*3/uL (ref 3.8–10.8)

## 2023-02-26 ENCOUNTER — Other Ambulatory Visit: Payer: Self-pay | Admitting: Nurse Practitioner

## 2023-02-27 NOTE — Telephone Encounter (Signed)
Requested Prescriptions  Pending Prescriptions Disp Refills   escitalopram (LEXAPRO) 20 MG tablet [Pharmacy Med Name: ESCITALOPRAM 20 MG TABLET] 90 tablet 0    Sig: TAKE 1 TABLET BY MOUTH EVERY DAY     Psychiatry:  Antidepressants - SSRI Passed - 02/26/2023  2:42 AM      Passed - Completed PHQ-2 or PHQ-9 in the last 360 days      Passed - Valid encounter within last 6 months    Recent Outpatient Visits           1 month ago Recurrent major depressive disorder, in partial remission [facility])   [place] [facility] Berniece Salines, FNP   8 months ago Sebaceous cyst   [facility] Della Goo F, FNP   1 year ago Recurrent major depressive disorder, in partial remission [facility])   [place] Della Goo F, FNP   1 year ago Recurrent major depressive disorder, in partial remission [facility])   [facility] Danelle Berry, PA-C   1 year ago Obesity (BMI 30.0-34.9)   [place] [facility] Berniece Salines, FNP       Future Appointments             In 4 months Zane Herald, Rudolpho Sevin, FNP [facility], [facility]

## 2023-03-29 ENCOUNTER — Other Ambulatory Visit: Payer: Self-pay | Admitting: Internal Medicine

## 2023-03-29 ENCOUNTER — Other Ambulatory Visit: Payer: Self-pay | Admitting: Nurse Practitioner

## 2023-03-29 DIAGNOSIS — I10 Essential (primary) hypertension: Secondary | ICD-10-CM

## 2023-03-31 NOTE — Telephone Encounter (Signed)
Requested Prescriptions  Pending Prescriptions Disp Refills   hydrochlorothiazide (HYDRODIURIL) 12.5 MG tablet [Pharmacy Med Name: HYDROCHLOROTHIAZIDE 12.5 MG TB] 90 tablet 0    Sig: TAKE 1 TABLET BY MOUTH EVERY DAY     Cardiovascular: Diuretics - Thiazide Passed - 03/29/2023  9:43 AM      Passed - Cr in normal range and within 180 days    Creat  Date Value Ref Range Status  01/14/2023 0.85 0.60 - 1.00 mg/dL Final         Passed - K in normal range and within 180 days    Potassium  Date Value Ref Range Status  01/14/2023 4.4 3.5 - 5.3 mmol/L Final  05/25/2012 4.1 3.5 - 5.1 mmol/L Final         Passed - Na in normal range and within 180 days    Sodium  Date Value Ref Range Status  01/14/2023 141 135 - 146 mmol/L Final  04/28/2016 143 134 - 144 mmol/L Final  05/25/2012 140 136 - 145 mmol/L Final         Passed - Last BP in normal range    BP Readings from Last 1 Encounters:  01/14/23 132/74         Passed - Valid encounter within last 6 months    Recent Outpatient Visits           2 months ago Recurrent major depressive disorder, in partial remission [facility])   [place] [facility] Berniece Salines, FNP   9 months ago Sebaceous cyst   [facility] Berniece Salines, FNP   1 year ago Recurrent major depressive disorder, in partial remission [facility])   [place] [facility] Della Goo F, FNP   1 year ago Recurrent major depressive disorder, in partial remission [facility])   [place] [facility] Danelle Berry, PA-C   1 year ago Obesity (BMI 30.0-34.9)   [place] [facility] Berniece Salines, FNP       Future Appointments             In 3 months Zane Herald, Rudolpho Sevin, FNP [facility], [facility]

## 2023-05-23 ENCOUNTER — Other Ambulatory Visit: Payer: Self-pay | Admitting: Nurse Practitioner

## 2023-05-25 NOTE — Telephone Encounter (Signed)
Requested Prescriptions  Pending Prescriptions Disp Refills   clopidogrel (PLAVIX) 75 MG tablet [Pharmacy Med Name: CLOPIDOGREL 75 MG TABLET] 90 tablet 0    Sig: TAKE 1 TABLET BY MOUTH EVERY DAY     Hematology: Antiplatelets - clopidogrel Passed - 05/23/2023  8:31 AM      Passed - HCT in normal range and within 180 days    HCT  Date Value Ref Range Status  01/14/2023 39.3 35.0 - 45.0 % Final   Hematocrit  Date Value Ref Range Status  10/09/2015 41.5 34.0 - 46.6 % Final         Passed - HGB in normal range and within 180 days    Hemoglobin  Date Value Ref Range Status  01/14/2023 13.5 11.7 - 15.5 g/dL Final  40/98/1191 47.8 11.1 - 15.9 g/dL Final         Passed - PLT in normal range and within 180 days    Platelets  Date Value Ref Range Status  01/14/2023 275 140 - 400 Thousand/uL Final  10/09/2015 324 150 - 379 x10E3/uL Final         Passed - Cr in normal range and within 360 days    Creat  Date Value Ref Range Status  01/14/2023 0.85 0.60 - 1.00 mg/dL Final         Passed - Valid encounter within last 6 months    Recent Outpatient Visits           4 months ago Recurrent major depressive disorder, in partial remission [facility])   [facility] Berniece Salines, FNP   11 months ago Sebaceous cyst   [facility] Della [facility]oo F, FNP   1 year ago Recurrent major depressive disorder, in partial remission [facility])   [place] [facility] Della Goo F, FNP   1 year ago Recurrent major depressive disorder, in partial remission [facility])   [place] [facility] Danelle Berry, PA-C   1 year ago Obesity (BMI 30.0-34.9)   [place] [facility] Berniece Salines, FNP       Future Appointments             In 1 month Zane Herald, Rudolpho Sevin, FNP [facility]             escitalopram (LEXAPRO) 20 MG tablet [Pharmacy Med Name: ESCITALOPRAM 20 MG TABLET] 90  tablet 0    Sig: TAKE 1 TABLET BY MOUTH EVERY DAY     Psychiatry:  Antidepressants - SSRI Passed - 05/23/2023  8:31 AM      Passed - Completed PHQ-2 or PHQ-9 in the last 360 days      Passed - Valid encounter within last 6 months    Recent Outpatient Visits           4 months ago Recurrent major depressive disorder, in partial remission [facility])   [place] [facility] Berniece Salines, FNP   11 months ago Sebaceous cyst   [facility] Della Goo F, FNP   1 year ago Recurrent major depressive disorder, in partial remission [facility])   [place] [facility] Della Goo F, FNP   1 year ago Recurrent major depressive disorder, in partial [facility])   [place] [facility] Danelle Berry, PA-C   1 year ago Obesity (BMI 30.0-34.9)   [facility] Berniece Salines, [place]  Future Appointments             In 1 month Pender, Rudolpho Sevin, FNP [facility], [facility]

## 2023-05-26 ENCOUNTER — Other Ambulatory Visit: Payer: Self-pay | Admitting: Nurse Practitioner

## 2023-05-26 DIAGNOSIS — M542 Cervicalgia: Secondary | ICD-10-CM

## 2023-05-26 DIAGNOSIS — I10 Essential (primary) hypertension: Secondary | ICD-10-CM

## 2023-05-27 NOTE — Telephone Encounter (Signed)
Rx 03/31/23 #90- too soon Requested Prescriptions  Pending Prescriptions Disp Refills   hydrochlorothiazide (HYDRODIURIL) 12.5 MG tablet [Pharmacy Med Name: HYDROCHLOROTHIAZIDE 12.5 MG TB] 90 tablet 0    Sig: TAKE 1 TABLET BY MOUTH EVERY DAY     Cardiovascular: Diuretics - Thiazide Passed - 05/26/2023  3:48 PM      Passed - Cr in normal range and within 180 days    Creat  Date Value Ref Range Status  01/14/2023 0.85 0.60 - 1.00 mg/dL Final         Passed - K in normal range and within 180 days    Potassium  Date Value Ref Range Status  01/14/2023 4.4 3.5 - 5.3 mmol/L Final  05/25/2012 4.1 3.5 - 5.1 mmol/L Final         Passed - Na in normal range and within 180 days    Sodium  Date Value Ref Range Status  01/14/2023 141 135 - 146 mmol/L Final  04/28/2016 143 134 - 144 mmol/L Final  05/25/2012 140 136 - 145 mmol/L Final         Passed - Last BP in normal range    BP Readings from Last 1 Encounters:  01/14/23 132/74         Passed - Valid encounter within last 6 months    Recent Outpatient Visits           4 months ago Recurrent major depressive disorder, in partial remission [facility])   [facility] Bernie[facility] Salines, FNP   11 months ago Sebaceous cyst   [facility] Salines, FNP   1 year ago Recurrent major depressive disorder, in partial remission [facility])   [place] [facility] Della Goo F, FNP   1 year ago Recurrent major depressive disorder, in partial remission [facility])   [place] [facility] Danelle Berry, PA-C   1 year ago Obesity (BMI 30.0-34.9)   [place] [facility] Berniece Salines, FNP       Future Appointments             In 1 month Zane Herald, Rudolpho Sevin, FNP [facility], [facility]             tiZANidine (ZANAFLEX) 4 MG tablet [Pharmacy Med Name: TIZANIDINE HCL 4 MG TABLET] 30 tablet     Sig: TAKE 0.5-1.5 TABLETS BY MOUTH EVERY  8 (EIGHT) HOURS AS NEEDED FOR MUSCLE SPASMS (MUSCLE TIGHTNESS).     Not Delegated - Cardiovascular:  Alpha-2 Agonists - tizanidine Failed - 05/26/2023  3:48 PM      Failed - This refill cannot be delegated      Passed - Valid encounter within last 6 months    Recent Outpatient Visits           4 months ago Recurrent major depressive disorder, in partial remission [facility])   [place] [facility] Berniece Salines, FNP   11 months ago Sebaceous cyst   [facility] Della Goo F, FNP   1 year ago Recurrent major depressive disorder, in partial remission [facility])   [place] [facility] Berniece Salines, FNP   1 year ago Recurrent major depressive disorder, in partial remission [facility])   [place] [facility] Danelle Berry, PA-C   1 year ago Obesity (BMI 30.0-34.9)   [facility] Berniece Salines, [place]  Future Appointments             In 1 month Pender, Rudolpho Sevin, FNP [facility], [facility]

## 2023-05-27 NOTE — Telephone Encounter (Signed)
Requested medication (s) are due for refill today - unsure  Requested medication (s) are on the active medication list -yes  Future visit scheduled -yes  Last refill: 03/14/23  Notes to clinic: non delegated Rx, listed as historical medication   Requested Prescriptions  Pending Prescriptions Disp Refills   tiZANidine (ZANAFLEX) 4 MG tablet [Pharmacy Med Name: TIZANIDINE HCL 4 MG TABLET] 30 tablet     Sig: TAKE 0.5-1.5 TABLETS BY MOUTH EVERY 8 (EIGHT) HOURS AS NEEDED FOR MUSCLE SPASMS (MUSCLE TIGHTNESS).     Not Delegated - Cardiovascular:  Alpha-2 Agonists - tizanidine Failed - 05/26/2023  3:48 PM      Failed - This refill cannot be delegated      Passed - Valid encounter within last 6 months    Recent Outpatient Visits           4 months ago Recurrent major depressive disorder, in partial remission [facility])   [place] [facility] Berniece Salines, FNP   11 months ago Sebaceous cyst   [facility] Della Goo F, FNP   1 year ago Recurrent major depressive disorder, in partial remission [facility])   [place] [facility] Berniece Salines, FNP   1 year ago Recurrent major depressive disorder, in partial remission [facility])   [place] Danelle Berry, PA-C   1 year ago Obesity (BMI 30.0-34.9)   [place] [facility] Berniece Salines, FNP       Future Appointments             In 1 month Zane Herald, Rudolpho Sevin, FNP [facility], [facility]            Refused Prescriptions Disp Refills   hydrochlorothiazide (HYDRODIURIL) 12.5 MG tablet [Pharmacy Med Name: HYDROCHLOROTHIAZIDE 12.5 MG TB] 90 tablet 0    Sig: TAKE 1 TABLET BY MOUTH EVERY DAY     Cardiovascular: Diuretics - Thiazide Passed - 05/26/2023  3:48 PM      Passed - Cr in normal range and within 180 days    Creat  Date Value Ref Range Status  01/14/2023 0.85 0.60 - 1.00 mg/dL Final         Passed - K in normal  range and within 180 days    Potassium  Date Value Ref Range Status  01/14/2023 4.4 3.5 - 5.3 mmol/L Final  05/25/2012 4.1 3.5 - 5.1 mmol/L Final         Passed - Na in normal range and within 180 days    Sodium  Date Value Ref Range Status  01/14/2023 141 135 - 146 mmol/L Final  04/28/2016 143 134 - 144 mmol/L Final  05/25/2012 140 136 - 145 mmol/L Final         Passed - Last BP in normal range    BP Readings from Last 1 Encounters:  01/14/23 132/74         Passed - Valid encounter within last 6 months    Recent Outpatient Visits           4 months ago Recurrent major depressive disorder, in partial remission [facility])   [facility] Berniece Salines, FNP   11 months ago Sebaceous cyst   [facility] Berniece Salines, FNP   1 year ago Recurrent major depressive disorder, in partial remission [facility])   [facility] Berniece Salines, FNP   1  year ago Recurrent major depressive disorder, in partial remission [facility])   [place] [facility], Sheliah Mends, PA-C   1 year ago Obesity (BMI 30.0-34.9)   [employer] [facility] Berniece Salines, FNP       Future Appointments             In 1 month Zane Herald, Rudolpho Sevin, FNP [facility], [facility]               Requested Prescriptions  Pending Prescriptions Disp Refills   tiZANidine (ZANAFLEX) 4 MG tablet [Pharmacy Med Name: TIZANIDINE HCL 4 MG TABLET] 30 tablet     Sig: TAKE 0.5-1.5 TABLETS BY MOUTH EVERY 8 (EIGHT) HOURS AS NEEDED FOR MUSCLE SPASMS (MUSCLE TIGHTNESS).     Not Delegated - Cardiovascular:  Alpha-2 Agonists - tizanidine Failed - 05/26/2023  3:48 PM      Failed - This refill cannot be delegated      Passed - Valid encounter within last 6 months    Recent Outpatient Visits           4 months ago Recurrent major depressive disorder, in partial remission [facility])   [place] [facility] Berniece Salines, FNP   11 months ago Sebaceous cyst   [facility] [facility] Della Goo F, FNP   1 year ago Recurrent major depressive disorder, in partial remission [facility])   [place] [facility] Berniece Salines, FNP   1 year ago Recurrent major depressive disorder, in partial remission [facility])   [place] [facility] Danelle Berry, PA-C   1 year ago Obesity (BMI 30.0-34.9)   [place] [facility] Berniece Salines, FNP       Future Appointments             In 1 month Zane Herald, Rudolpho Sevin, FNP [facility], [facility]            Refused Prescriptions Disp Refills   hydrochlorothiazide (HYDRODIURIL) 12.5 MG tablet [Pharmacy Med Name: HYDROCHLOROTHIAZIDE 12.5 MG TB] 90 tablet 0    Sig: TAKE 1 TABLET BY MOUTH EVERY DAY     Cardiovascular: Diuretics - Thiazide Passed - 05/26/2023  3:48 PM      Passed - Cr in normal range and within 180 days    Creat  Date Value Ref Range Status  01/14/2023 0.85 0.60 - 1.00 mg/dL Final         Passed - K in normal range and within 180 days    Potassium  Date Value Ref Range Status  01/14/2023 4.4 3.5 - 5.3 mmol/L Final  05/25/2012 4.1 3.5 - 5.1 mmol/L Final         Passed - Na in normal range and within 180 days    Sodium  Date Value Ref Range Status  01/14/2023 141 135 - 146 mmol/L Final  04/28/2016 143 134 - 144 mmol/L Final  05/25/2012 140 136 - 145 mmol/L Final         Passed - Last BP in normal range    BP Readings from Last 1 Encounters:  01/14/23 132/74         Passed - Valid encounter within last 6 months    Recent Outpatient Visits           4 months ago Recurrent major depressive disorder, in partial remission [facility])   [facility] Berniece Salines, [place]  11 months ago Sebaceous cyst   [facility] Della Goo F, FNP   1 year ago Recurrent major depressive disorder, in partial  remission [facility])   [place] Della Goo F, FNP   1 year ago Recurrent major depressive disorder, in partial remission [facility])   [place] [facility] Danelle Berry, PA-C   1 year ago Obesity (BMI 30.0-34.9)   [place] [facility] Berniece Salines, FNP       Future Appointments             In 1 month Zane Herald, Rudolpho Sevin, FNP [facility], [facility]

## 2023-07-04 ENCOUNTER — Other Ambulatory Visit: Payer: Self-pay | Admitting: Internal Medicine

## 2023-07-04 ENCOUNTER — Other Ambulatory Visit: Payer: Self-pay | Admitting: Nurse Practitioner

## 2023-07-04 DIAGNOSIS — E782 Mixed hyperlipidemia: Secondary | ICD-10-CM

## 2023-07-04 DIAGNOSIS — I10 Essential (primary) hypertension: Secondary | ICD-10-CM

## 2023-07-06 NOTE — Telephone Encounter (Signed)
Requested Prescriptions  Pending Prescriptions Disp Refills   rosuvastatin (CRESTOR) 10 MG tablet [Pharmacy Med Name: ROSUVASTATIN CALCIUM 10 MG TAB] 90 tablet 3    Sig: TAKE 1 TABLET BY MOUTH EVERY DAY     Cardiovascular:  Antilipid - Statins 2 Failed - 07/04/2023  4:41 PM      Failed - Lipid Panel in normal range within the last 12 months    Cholesterol, Total  Date Value Ref Range Status  04/28/2016 127 100 - 199 mg/dL Final   Cholesterol  Date Value Ref Range Status  01/14/2023 91 <200 mg/dL Final   LDL Cholesterol (Calc)  Date Value Ref Range Status  01/14/2023 33 mg/dL (calc) Final    Comment:    Reference range: <100 . Desirable range <100 mg/dL for primary prevention;   <70 mg/dL for patients with CHD or diabetic patients  with > or = 2 CHD risk factors. Marland Kitchen LDL-C is now calculated using the Martin-Hopkins  calculation, which is a validated novel method providing  better accuracy than the Friedewald equation in the  estimation of LDL-C.  Horald Pollen et al. Lenox Ahr. 1610;960(45): 2061-2068  (http://education.[employer].com/faq/FAQ164)    HDL  Date Value Ref Range Status  01/14/2023 32 (L) > OR = 50 mg/dL Final  40/98/1191 37 (L) >39 mg/dL Final   Triglycerides  Date Value Ref Range Status  01/14/2023 193 (H) <150 mg/dL Final         Passed - Cr in normal range and within 360 days    Creat  Date Value Ref Range Status  01/14/2023 0.85 0.60 - 1.00 mg/dL Final         Passed - Patient is not pregnant      Passed - Valid encounter within last 12 months    Recent Outpatient Visits           5 months ago Recurrent major depressive disorder, in partial remission [facility])   [place] [facility] Berniece Salines, FNP   1 year ago Sebaceous cyst   [facility] Della Goo F, FNP   1 year ago Recurrent major depressive disorder, in partial remission [facility])   [place] [facility] Della Goo F, FNP    1 year ago Recurrent major depressive disorder, in partial remission [facility])   [place] [facility] Danelle Berry, PA-C   1 year ago Obesity (BMI 30.0-34.9)   [place] [facility] Della Goo F, FNP       Future Appointments             In 1 week Berniece Salines, FNP [facility], [facility]             valsartan (DIOVAN) 80 MG tablet [Pharmacy Med Name: VALSARTAN 80 MG TABLET] 90 tablet 1    Sig: TAKE 1 TABLET BY MOUTH EVERY DAY     Cardiovascular:  Angiotensin Receptor Blockers Passed - 07/04/2023  4:41 PM      Passed - Cr in normal range and within 180 days    Creat  Date Value Ref Range Status  01/14/2023 0.85 0.60 - 1.00 mg/dL Final         Passed - K in normal range and within 180 days    Potassium  Date Value Ref Range Status  01/14/2023 4.4 3.5 - 5.3 mmol/L Final  05/25/2012 4.1 3.5 - 5.1 mmol/L Final         Passed -  Patient is not pregnant      Passed - Last BP in normal range    BP Readings from Last 1 Encounters:  01/14/23 132/74         Passed - Valid encounter within last 6 months    Recent Outpatient Visits           5 months ago Recurrent major depressive disorder, in partial remission [facility])   [place] [facility] Berniece Salines, FNP   1 year ago Sebaceous cyst   [facility] Berniece Salines, FNP   1 year ago Recurrent major depressive disorder, in partial remission [facility])   [place] [facility] Berniece Salines, FNP   1 year ago Recurrent major depressive disorder, in partial remission [facility])   [facility] Danelle Berry, PA-C   1 year ago Obesity (BMI 30.0-34.9)   [place] [facility] Berniece Salines, FNP       Future Appointments             In 1 week Zane Herald, Rudolpho Sevin, FNP [facility], [facility]

## 2023-07-06 NOTE — Telephone Encounter (Signed)
Requested Prescriptions  Pending Prescriptions Disp Refills   rosuvastatin (CRESTOR) 10 MG tablet [Pharmacy Med Name: ROSUVASTATIN CALCIUM 10 MG TAB] 90 tablet 3    Sig: TAKE 1 TABLET BY MOUTH EVERY DAY     Cardiovascular:  Antilipid - Statins 2 Failed - 07/04/2023  4:41 PM      Failed - Lipid Panel in normal range within the last 12 months    Cholesterol, Total  Date Value Ref Range Status  04/28/2016 127 100 - 199 mg/dL Final   Cholesterol  Date Value Ref Range Status  01/14/2023 91 <200 mg/dL Final   LDL Cholesterol (Calc)  Date Value Ref Range Status  01/14/2023 33 mg/dL (calc) Final    Comment:    Reference range: <100 . Desirable range <100 mg/dL for primary prevention;   <70 mg/dL for patients with CHD or diabetic patients  with > or = 2 CHD risk factors. Marland Kitchen LDL-C is now calculated using the Martin-Hopkins  calculation, which is a validated novel method providing  better accuracy than the Friedewald equation in the  estimation of LDL-C.  Horald Pollen et al. Lenox Ahr. 7829;562(13): 2061-2068  (http://education.[facility].com/faq/FAQ164)    HDL  Date Value Ref Range Status  01/14/2023 32 (L) > OR = 50 mg/dL Final  08/65/7846 37 (L) >39 mg/dL Final   Triglycerides  Date Value Ref Range Status  01/14/2023 193 (H) <150 mg/dL Final         Passed - Cr in normal range and within 360 days    Creat  Date Value Ref Range Status  01/14/2023 0.85 0.60 - 1.00 mg/dL Final         Passed - Patient is not pregnant      Passed - Valid encounter within last 12 months    Recent Outpatient Visits           5 months ago Recurrent major depressive disorder, in partial remission [facility])   [place] [facility] Berniece Salines, FNP   1 year ago Sebaceous cyst   [facility] Della Goo F, FNP   1 year ago Recurrent major depressive disorder, in partial remission [facility])   [place] [facility] Della Goo F, FNP    1 year ago Recurrent major depressive disorder, in partial remission [facility])   [place] [facility] Danelle Berry, PA-C   1 year ago Obesity (BMI 30.0-34.9)   [place] [facility] Berniece Salines, FNP       Future Appointments             In 1 week Berniece Salines, FNP [facility], [facility]            Signed Prescriptions Disp Refills   valsartan (DIOVAN) 80 MG tablet 90 tablet 0    Sig: TAKE 1 TABLET BY MOUTH EVERY DAY     Cardiovascular:  Angiotensin Receptor Blockers Passed - 07/04/2023  4:41 PM      Passed - Cr in normal range and within 180 days    Creat  Date Value Ref Range Status  01/14/2023 0.85 0.60 - 1.00 mg/dL Final         Passed - K in normal range and within 180 days    Potassium  Date Value Ref Range Status  01/14/2023 4.4 3.5 - 5.3 mmol/L Final  05/25/2012 4.1 3.5 - 5.1 mmol/L Final         Passed - Patient is  not pregnant      Passed - Last BP in normal range    BP Readings from Last 1 Encounters:  01/14/23 132/74         Passed - Valid encounter within last 6 months    Recent Outpatient Visits           5 months ago Recurrent major depressive disorder, in partial remission [facility])   [place] [facility] Berniece Salines, FNP   1 year ago Sebaceous cyst   [facility] Berniece Salines, FNP   1 year ago Recurrent major depressive disorder, in partial remission [facility])   [place] [facility] Berniece Salines, FNP   1 year ago Recurrent major depressive disorder, in partial remission [facility])   [place] [facility] Danelle Berry, PA-C   1 year ago Obesity (BMI 30.0-34.9)   [place] [facility] Berniece Salines, FNP       Future Appointments             In 1 week Zane Herald, Rudolpho Sevin, FNP [facility], [facility]

## 2023-07-17 ENCOUNTER — Ambulatory Visit: Payer: Medicare HMO | Admitting: Nurse Practitioner

## 2023-07-17 NOTE — Progress Notes (Deleted)
There were no vitals taken for this visit.   Subjective:    Patient ID: Robin Bryan, female    DOB: [birth year], 71 y.o.   MRN: [id number]  HPI: Robin Bryan is a 72 y.o. female  No chief complaint on file.  Depression Medication lexapro 20 mg daily Compliant yes Side effects none PHQ9 negative GAD negative    01/14/2023   11:43 AM 01/14/2023   11:25 AM 06/10/2022    3:11 PM 01/31/2022    2:47 PM 01/03/2022    3:07 PM  Depression screen PHQ 2/9  Decreased Interest 0 0 0 0 1  Down, Depressed, Hopeless 0 0 0 0 2  PHQ - 2 Score 0 0 0 0 3  Altered sleeping 0   2 0  Tired, decreased energy 0   0 2  Change in appetite 0   0 2  Feeling bad or failure about yourself  0   0 0  Trouble concentrating 0   0 0  Moving slowly or fidgety/restless 0   0 0  Suicidal thoughts 0    0  PHQ-9 Score 0   2 7  Difficult doing work/chores Not difficult at all    Not difficult at all    Asthma: she says that her breathing has been good. She says she occasionally uses her  albuterol inhaler but not often.  No changes.   Hypertension:  -Medications: propanolol 60 mg daily, hydrochlorothiazide 12.5 mg daily and valsartan 80 mg daily -Patient is compliant with above medications and reports no side effects. -Checking BP at home (average): *** -Denies any SOB, CP, vision changes, LE edema or symptoms of hypotension -Diet:recommend DASH diet  -Exercise: recommend 150 min of physical activity weekly       01/14/2023   11:18 AM 06/17/2022   10:24 AM 06/10/2022    3:11 PM  Vitals with BMI  Height 5\' 8"  5\' 8"  5\' 8"   Weight 215 lbs 14 oz 220 lbs 223 lbs 3 oz  BMI 32.84 33.46 33.95  Systolic 132 105 993  Diastolic 74 74 82  Pulse 77 85 74    HLD:  -Medications: rosuvastatin 10 mg daily -Patient is compliant with above medications and reports no side effects.  -Last lipid panel:  Lipid Panel     Component Value Date/Time   CHOL 91 01/14/2023 1200   CHOL 127 04/28/2016 1012    TRIG 193 (H) 01/14/2023 1200   HDL 32 (L) 01/14/2023 1200   HDL 37 (L) 04/28/2016 1012   CHOLHDL 2.8 01/14/2023 1200   VLDL 35 (H) 02/12/2017 0909   LDLCALC 33 01/14/2023 1200   LABVLDL 29 04/28/2016 1012     GERD: she is currently taking pantoprazole 40 mg two times a day. She reports she is doing well. No changes  Prediabetes:  she is currently taking metformin 500 mg daily.  Last A1C was 5.7. denies any polyuria, polydipsia or polyphagia  Obesity: Patient's previous weight was  215 pounds BMI of 32.83.  today's weight ***, BMI ***. Recommend working on lifestyle modification including eating a well-balanced diet with portion control and increasing physical activity as tolerated.comorbidities includie GERD, HLD, HTN, depression  Relevant past medical, surgical, family and social history reviewed and updated as indicated. Interim medical history since our last visit reviewed. Allergies and medications reviewed and updated.  Review of Systems  Constitutional: Negative for fever or weight change.  Respiratory: Negative for cough and shortness of breath.  Cardiovascular: Negative for chest pain or palpitations.  Gastrointestinal: Negative for abdominal pain, no bowel changes.  Musculoskeletal: Negative for gait problem or joint swelling.  Skin: Negative for rash.  Neurological: Negative for dizziness or headache.  No other specific complaints in a complete review of systems (except as listed in HPI above).      Objective:    There were no vitals taken for this visit.  Wt Readings from Last 3 Encounters:  01/14/23 215 lb 14.4 oz (97.9 kg)  06/17/22 220 lb (99.8 kg)  06/10/22 223 lb 3.2 oz (101.2 kg)    Physical Exam  Constitutional: Patient appears well-developed and well-nourished. Obese  No distress.  HEENT: head atraumatic, normocephalic, pupils equal and reactive to light, neck supple, throat within normal limits Cardiovascular: Normal rate, regular rhythm and normal heart  sounds.  No murmur heard. No BLE edema. Pulmonary/Chest: Effort normal and breath sounds normal. No respiratory distress. Abdominal: Soft.  There is no tenderness. Psychiatric: Patient has a normal mood and affect. behavior is normal. Judgment and thought content normal.      Assessment & Plan:   Problem List Items Addressed This Visit   None     Follow up plan: No follow-ups on file.

## 2023-07-21 ENCOUNTER — Other Ambulatory Visit: Payer: Self-pay | Admitting: Nurse Practitioner

## 2023-07-21 DIAGNOSIS — I10 Essential (primary) hypertension: Secondary | ICD-10-CM

## 2023-07-22 NOTE — Telephone Encounter (Signed)
Requested medication (s) are due for refill today: Yes  Requested medication (s) are on the active medication list: Yes  Last refill:  Lexapro 05/25/23, hydrochlorothiazide 03/31/23, Plavix 05/25/23  Future visit scheduled: No  Notes to clinic:  Unable to refill per protocol, appointment needed.      Requested Prescriptions  Pending Prescriptions Disp Refills   escitalopram (LEXAPRO) 20 MG tablet [Pharmacy Med Name: ESCITALOPRAM 20 MG TABLET] 90 tablet 0    Sig: TAKE 1 TABLET BY MOUTH EVERY DAY     Psychiatry:  Antidepressants - SSRI Failed - 07/21/2023 10:06 AM      Failed - Valid encounter within last 6 months    Recent Outpatient Visits           6 months ago Recurrent major depressive disorder, in partial remission [facility])   [place] [facility] Berniece Salines, FNP   1 year ago Sebaceous cyst   [facility] Della Goo F, FNP   1 year ago Recurrent major depressive disorder, in partial remission [facility])   [place] [facility] Della Goo F, FNP   1 year ago Recurrent major depressive disorder, in partial remission [facility])   [place] [facility] Danelle Berry, PA-C   1 year ago Obesity (BMI 30.0-34.9)   [place] [facility] Berniece Salines, [place]              Passed - Completed PHQ-2 or PHQ-9 in the last 360 days       clopidogrel (PLAVIX) 75 MG tablet [Pharmacy Med Name: CLOPIDOGREL 75 MG TABLET] 90 tablet 0    Sig: TAKE 1 TABLET BY MOUTH EVERY DAY     Hematology: Antiplatelets - clopidogrel Failed - 07/21/2023 10:06 AM      Failed - HCT in normal range and within 180 days    HCT  Date Value Ref Range Status  01/14/2023 39.3 35.0 - 45.0 % Final   Hematocrit  Date Value Ref Range Status  10/09/2015 41.5 34.0 - 46.6 % Final         Failed - HGB in normal range and within 180 days    Hemoglobin  Date Value Ref Range Status  01/14/2023 13.5 11.7 - 15.5 g/dL Final   08/65/7846 96.2 11.1 - 15.9 g/dL Final         Failed - PLT in normal range and within 180 days    Platelets  Date Value Ref Range Status  01/14/2023 275 140 - 400 Thousand/uL Final  10/09/2015 324 150 - 379 x10E3/uL Final         Failed - Valid encounter within last 6 months    Recent Outpatient Visits           6 months ago Recurrent major depressive disorder, in partial remission [facility])   [facility] Berniece Salines, FNP   1 year ago Sebaceous cyst   [facility] Berniece Salines, FNP   1 year ago Recurrent major depressive disorder, in partial remission [facility])   [place] [facility] Della Goo F, FNP   1 year ago Recurrent major depressive disorder, in partial remission [facility])   [place] [facility] Danelle Berry, PA-C   1 year ago Obesity (BMI 30.0-34.9)   [facility] Berniece Salines, [place]              Passed - Cr in normal  range and within 360 days    Creat  Date Value Ref Range Status  01/14/2023 0.85 0.60 - 1.00 mg/dL Final          hydrochlorothiazide (HYDRODIURIL) 12.5 MG tablet [Pharmacy Med Name: HYDROCHLOROTHIAZIDE 12.5 MG TB] 90 tablet 0    Sig: TAKE 1 TABLET BY MOUTH EVERY DAY     Cardiovascular: Diuretics - Thiazide Failed - 07/21/2023 10:06 AM      Failed - Cr in normal range and within 180 days    Creat  Date Value Ref Range Status  01/14/2023 0.85 0.60 - 1.00 mg/dL Final         Failed - K in normal range and within 180 days    Potassium  Date Value Ref Range Status  01/14/2023 4.4 3.5 - 5.3 mmol/L Final  05/25/2012 4.1 3.5 - 5.1 mmol/L Final         Failed - Na in normal range and within 180 days    Sodium  Date Value Ref Range Status  01/14/2023 141 135 - 146 mmol/L Final  04/28/2016 143 134 - 144 mmol/L Final  05/25/2012 140 136 - 145 mmol/L Final         Failed - Valid encounter within last 6 months    Recent  Outpatient Visits           6 months ago Recurrent major depressive disorder, in partial remission [facility])   [place] [facility] Berniece Salines, FNP   1 year ago Sebaceous cyst   [facility] Berniece Salines, FNP   1 year ago Recurrent major depressive disorder, in partial remission [facility])   [place] [facility] Berniece Salines, FNP   1 year ago Recurrent major depressive disorder, in partial remission [facility])    [facility] Danelle Berry, PA-C   1 year ago Obesity (BMI 30.0-34.9)   [facility] Berniece Salines, [place]              Passed - Last BP in normal range    BP Readings from Last 1 Encounters:  01/14/23 132/74

## 2023-07-22 NOTE — Telephone Encounter (Signed)
No future appt scheduled

## 2023-08-03 ENCOUNTER — Other Ambulatory Visit: Payer: Self-pay | Admitting: Family Medicine

## 2023-08-03 DIAGNOSIS — Z13820 Encounter for screening for osteoporosis: Secondary | ICD-10-CM

## 2023-08-21 ENCOUNTER — Other Ambulatory Visit: Payer: Self-pay | Admitting: Nurse Practitioner

## 2023-08-21 DIAGNOSIS — I10 Essential (primary) hypertension: Secondary | ICD-10-CM

## 2023-08-24 NOTE — Telephone Encounter (Signed)
Rx 07/22/23 #90- too soon Attempted to call patient to schedule follow up appointment- overdue- left message to call office Requested Prescriptions  Pending Prescriptions Disp Refills   hydrochlorothiazide (HYDRODIURIL) 12.5 MG tablet [Pharmacy Med Name: HYDROCHLOROTHIAZIDE 12.5 MG TB] 90 tablet 0    Sig: TAKE 1 TABLET BY MOUTH EVERY DAY     Cardiovascular: Diuretics - Thiazide Failed - 08/24/2023 11:23 AM      Failed - Cr in normal range and within 180 days    Creat  Date Value Ref Range Status  01/14/2023 0.85 0.60 - 1.00 mg/dL Final         Failed - K in normal range and within 180 days    Potassium  Date Value Ref Range Status  01/14/2023 4.4 3.5 - 5.3 mmol/L Final  05/25/2012 4.1 3.5 - 5.1 mmol/L Final         Failed - Na in normal range and within 180 days    Sodium  Date Value Ref Range Status  01/14/2023 141 135 - 146 mmol/L Final  04/28/2016 143 134 - 144 mmol/L Final  05/25/2012 140 136 - 145 mmol/L Final         Failed - Valid encounter within last 6 months    Recent Outpatient Visits           7 months ago Recurrent major depressive disorder, in partial remission [facility])   [place] [facility] Berniece Salines, FNP   1 year ago Sebaceous cyst   [facility] Berniece Salines, FNP   1 year ago Recurrent major depressive disorder, in partial remission [facility])   [place] [facility] Berniece Salines, FNP   1 year ago Recurrent major depressive disorder, in partial remission [facility])    [facility] Danelle Berry, PA-C   1 year ago Obesity (BMI 30.0-34.9)   [facility] Berniece Salines, [place]              Passed - Last BP in normal range    BP Readings from Last 1 Encounters:  01/14/23 132/74

## 2023-10-17 ENCOUNTER — Other Ambulatory Visit: Payer: Self-pay | Admitting: Nurse Practitioner

## 2023-10-17 DIAGNOSIS — E782 Mixed hyperlipidemia: Secondary | ICD-10-CM

## 2023-10-19 NOTE — Telephone Encounter (Signed)
Requested Prescriptions  Pending Prescriptions Disp Refills   rosuvastatin (CRESTOR) 10 MG tablet [Pharmacy Med Name: ROSUVASTATIN CALCIUM 10 MG TAB] 90 tablet 0    Sig: TAKE 1 TABLET BY MOUTH EVERY DAY     Cardiovascular:  Antilipid - Statins 2 Failed - 10/19/2023  1:49 PM      Failed - Lipid Panel in normal range within the last 12 months    Cholesterol, Total  Date Value Ref Range Status  04/28/2016 127 100 - 199 mg/dL Final   Cholesterol  Date Value Ref Range Status  01/14/2023 91 <200 mg/dL Final   LDL Cholesterol (Calc)  Date Value Ref Range Status  01/14/2023 33 mg/dL (calc) Final    Comment:    Reference range: <100 . Desirable range <100 mg/dL for primary prevention;   <70 mg/dL for patients with CHD or diabetic patients  with > or = 2 CHD risk factors. Marland Kitchen LDL-C is now calculated using the Martin-Hopkins  calculation, which is a validated novel method providing  better accuracy than the Friedewald equation in the  estimation of LDL-C.  Horald Pollen et al. Lenox Ahr. 1610;960(45): 2061-2068  (http://education.[facility].com/faq/FAQ164)    HDL  Date Value Ref Range Status  01/14/2023 32 (L) > OR = 50 mg/dL Final  40/98/1191 37 (L) >39 mg/dL Final   Triglycerides  Date Value Ref Range Status  01/14/2023 193 (H) <150 mg/dL Final         Passed - Cr in normal range and within 360 days    Creat  Date Value Ref Range Status  01/14/2023 0.85 0.60 - 1.00 mg/dL Final         Passed - Patient is not pregnant      Passed - Valid encounter within last 12 months    Recent Outpatient Visits           9 months ago Recurrent major depressive disorder, in partial remission [facility])   [place] [facility] Berniece Salines, FNP   1 year ago Sebaceous cyst   [facility] Della Goo F, FNP   1 year ago Recurrent major depressive disorder, in partial remission [facility])   [place] [facility] Della Goo F, FNP    1 year ago Recurrent major depressive disorder, in partial remission [facility])   [place] [facility] Danelle Berry, PA-C   2 years ago Obesity (BMI 30.0-34.9)   [facility] Berniece Salines, [place]

## 2023-10-23 ENCOUNTER — Other Ambulatory Visit: Payer: Self-pay | Admitting: Family Medicine

## 2023-10-23 DIAGNOSIS — Z1231 Encounter for screening mammogram for malignant neoplasm of breast: Secondary | ICD-10-CM

## 2023-11-10 ENCOUNTER — Ambulatory Visit
Admission: RE | Admit: 2023-11-10 | Discharge: 2023-11-10 | Disposition: A | Discharge status: Home / Self Care | Payer: Medicare Other | Source: Ambulatory Visit | Attending: Family Medicine | Admitting: Family Medicine

## 2023-11-10 DIAGNOSIS — Z1231 Encounter for screening mammogram for malignant neoplasm of breast: Secondary | ICD-10-CM

## 2023-11-26 ENCOUNTER — Other Ambulatory Visit: Payer: Self-pay | Admitting: Nurse Practitioner

## 2023-11-26 DIAGNOSIS — I10 Essential (primary) hypertension: Secondary | ICD-10-CM

## 2023-11-27 NOTE — Telephone Encounter (Signed)
 Requested medications are due for refill today.  yes  Requested medications are on the active medications list.  yes  Last refill. 07/06/2023 #90 0 rf  Future visit scheduled.   no  Notes to clinic.  Pt is more than 3 months overdue for an ov.     Requested Prescriptions  Pending Prescriptions Disp Refills   valsartan (DIOVAN) 80 MG tablet [Pharmacy Med Name: VALSARTAN 80 MG TABLET] 90 tablet 0    Sig: TAKE 1 TABLET BY MOUTH EVERY DAY     Cardiovascular:  Angiotensin Receptor Blockers Failed - 11/27/2023 10:49 AM      Failed - Cr in normal range and within 180 days    Creat  Date Value Ref Range Status  01/14/2023 0.85 0.60 - 1.00 mg/dL Final         Failed - K in normal range and within 180 days    Potassium  Date Value Ref Range Status  01/14/2023 4.4 3.5 - 5.3 mmol/L Final  05/25/2012 4.1 3.5 - 5.1 mmol/L Final         Failed - Valid encounter within last 6 months    Recent Outpatient Visits   None            Passed - Patient is not pregnant      Passed - Last BP in normal range    BP Readings from Last 1 Encounters:  01/14/23 132/74

## 2023-12-05 ENCOUNTER — Other Ambulatory Visit: Payer: Self-pay | Admitting: Nurse Practitioner

## 2023-12-05 ENCOUNTER — Other Ambulatory Visit: Payer: Self-pay | Admitting: Internal Medicine

## 2023-12-05 DIAGNOSIS — M542 Cervicalgia: Secondary | ICD-10-CM

## 2023-12-05 DIAGNOSIS — E782 Mixed hyperlipidemia: Secondary | ICD-10-CM

## 2023-12-07 NOTE — Telephone Encounter (Signed)
 Unable to refill per protocol, appointment needed.   Requested Prescriptions  Pending Prescriptions Disp Refills   tiZANidine (ZANAFLEX) 4 MG tablet [Pharmacy Med Name: TIZANIDINE HCL 4 MG TABLET] 30 tablet 1    Sig: TAKE 0.5-1.5 TABLETS BY MOUTH EVERY 8 (EIGHT) HOURS AS NEEDED FOR MUSCLE SPASMS (MUSCLE TIGHTNESS).     Not Delegated - Cardiovascular:  Alpha-2 Agonists - tizanidine Failed - 12/07/2023 12:36 PM      Failed - This refill cannot be delegated      Failed - Valid encounter within last 6 months    Recent Outpatient Visits   None             rosuvastatin (CRESTOR) 10 MG tablet [Pharmacy Med Name: ROSUVASTATIN CALCIUM 10 MG TAB] 90 tablet 0    Sig: TAKE 1 TABLET BY MOUTH EVERY DAY     Cardiovascular:  Antilipid - Statins 2 Failed - 12/07/2023 12:36 PM      Failed - Valid encounter within last 12 months    Recent Outpatient Visits   None            Failed - Lipid Panel in normal range within the last 12 months    Cholesterol, Total  Date Value Ref Range Status  04/28/2016 127 100 - 199 mg/dL Final   Cholesterol  Date Value Ref Range Status  01/14/2023 91 <200 mg/dL Final   LDL Cholesterol (Calc)  Date Value Ref Range Status  01/14/2023 33 mg/dL (calc) Final    Comment:    Reference range: <100 . Desirable range <100 mg/dL for primary prevention;   <70 mg/dL for patients with CHD or diabetic patients  with > or = 2 CHD risk factors. Marland Kitchen LDL-C is now calculated using the Martin-Hopkins  calculation, which is a validated novel method providing  better accuracy than the Friedewald equation in the  estimation of LDL-C.  Horald Pollen et al. Lenox Ahr. 7829;562(13): 2061-2068  (http://education.[facility].com/faq/FAQ164)    HDL  Date Value Ref Range Status  01/14/2023 32 (L) > OR = 50 mg/dL Final  08/65/7846 37 (L) >39 mg/dL Final   Triglycerides  Date Value Ref Range Status  01/14/2023 193 (H) <150 mg/dL Final         Passed - Cr in normal range and  within 360 days    Creat  Date Value Ref Range Status  01/14/2023 0.85 0.60 - 1.00 mg/dL Final         Passed - Patient is not pregnant

## 2023-12-20 ENCOUNTER — Other Ambulatory Visit: Payer: Self-pay | Admitting: Nurse Practitioner

## 2023-12-21 NOTE — Telephone Encounter (Signed)
 Requested medications are due for refill today.  yes  Requested medications are on the active medications list.  yes  Last refill. 07/22/2023 #90 0 rf  Future visit scheduled.   no  Notes to clinic.  Pt is more than 3 months overdue for an ov.    Requested Prescriptions  Pending Prescriptions Disp Refills   clopidogrel  (PLAVIX ) 75 MG tablet [Pharmacy Med Name: CLOPIDOGREL  75 MG TABLET] 90 tablet 0    Sig: TAKE 1 TABLET BY MOUTH EVERY DAY     Hematology: Antiplatelets - clopidogrel  Failed - 12/21/2023  3:25 PM      Failed - HCT in normal range and within 180 days    HCT  Date Value Ref Range Status  01/14/2023 39.3 35.0 - 45.0 % Final   Hematocrit  Date Value Ref Range Status  10/09/2015 41.5 34.0 - 46.6 % Final         Failed - HGB in normal range and within 180 days    Hemoglobin  Date Value Ref Range Status  01/14/2023 13.5 11.7 - 15.5 g/dL Final  16/06/9603 54.0 11.1 - 15.9 g/dL Final         Failed - PLT in normal range and within 180 days    Platelets  Date Value Ref Range Status  01/14/2023 275 140 - 400 Thousand/uL Final  10/09/2015 324 150 - 379 x10E3/uL Final         Failed - Valid encounter within last 6 months    Recent Outpatient Visits   None            Passed - Cr in normal range and within 360 days    Creat  Date Value Ref Range Status  01/14/2023 0.85 0.60 - 1.00 mg/dL Final

## 2023-12-21 NOTE — Telephone Encounter (Signed)
 Called pt - left message to return our call and schedule an OV.

## 2024-02-11 ENCOUNTER — Other Ambulatory Visit: Payer: Self-pay | Admitting: Family Medicine

## 2024-02-11 DIAGNOSIS — Z13820 Encounter for screening for osteoporosis: Secondary | ICD-10-CM

## 2024-03-03 ENCOUNTER — Other Ambulatory Visit: Payer: Self-pay | Admitting: Nurse Practitioner

## 2024-03-03 DIAGNOSIS — E782 Mixed hyperlipidemia: Secondary | ICD-10-CM

## 2024-03-07 NOTE — Telephone Encounter (Signed)
 Unable to refill per protocol, appointment needed.   Requested Prescriptions  Pending Prescriptions Disp Refills   rosuvastatin  (CRESTOR ) 10 MG tablet [Pharmacy Med Name: ROSUVASTATIN  CALCIUM  10 MG TAB] 90 tablet 0    Sig: TAKE 1 TABLET BY MOUTH EVERY DAY     Cardiovascular:  Antilipid - Statins 2 Failed - 03/07/2024  2:51 PM      Failed - Cr in normal range and within 360 days    Creat  Date Value Ref Range Status  01/14/2023 0.85 0.60 - 1.00 mg/dL Final         Failed - Valid encounter within last 12 months    Recent Outpatient Visits   None            Failed - Lipid Panel in normal range within the last 12 months    Cholesterol, Total  Date Value Ref Range Status  04/28/2016 127 100 - 199 mg/dL Final   Cholesterol  Date Value Ref Range Status  01/14/2023 91 <200 mg/dL Final   LDL Cholesterol (Calc)  Date Value Ref Range Status  01/14/2023 33 mg/dL (calc) Final    Comment:    Reference range: <100 . Desirable range <100 mg/dL for primary prevention;   <70 mg/dL for patients with CHD or diabetic patients  with > or = 2 CHD risk factors. SABRA LDL-C is now calculated using the Martin-Hopkins  calculation, which is a validated novel method providing  better accuracy than the Friedewald equation in the  estimation of LDL-C.  Gladis APPLETHWAITE et al. SANDREA. 7986;689(80): 2061-2068  (http://education.[facility].com/faq/FAQ164)    HDL  Date Value Ref Range Status  01/14/2023 32 (L) > OR = 50 mg/dL Final  91/71/7982 37 (L) >39 mg/dL Final   Triglycerides  Date Value Ref Range Status  01/14/2023 193 (H) <150 mg/dL Final         Passed - Patient is not pregnant       clopidogrel  (PLAVIX ) 75 MG tablet [Pharmacy Med Name: CLOPIDOGREL  75 MG TABLET] 90 tablet 0    Sig: TAKE 1 TABLET BY MOUTH EVERY DAY     Hematology: Antiplatelets - clopidogrel  Failed - 03/07/2024  2:51 PM      Failed - HCT in normal range and within 180 days    HCT  Date Value Ref Range Status   01/14/2023 39.3 35.0 - 45.0 % Final   Hematocrit  Date Value Ref Range Status  10/09/2015 41.5 34.0 - 46.6 % Final         Failed - HGB in normal range and within 180 days    Hemoglobin  Date Value Ref Range Status  01/14/2023 13.5 11.7 - 15.5 g/dL Final  97/92/7982 85.6 11.1 - 15.9 g/dL Final         Failed - PLT in normal range and within 180 days    Platelets  Date Value Ref Range Status  01/14/2023 275 140 - 400 Thousand/uL Final  10/09/2015 324 150 - 379 x10E3/uL Final         Failed - Cr in normal range and within 360 days    Creat  Date Value Ref Range Status  01/14/2023 0.85 0.60 - 1.00 mg/dL Final         Failed - Valid encounter within last 6 months    Recent Outpatient Visits   None

## 2024-03-16 ENCOUNTER — Ambulatory Visit: Payer: Self-pay | Admitting: Surgery

## 2024-03-16 NOTE — H&P (View-Only) (Signed)
 REFERRING PHYSICIAN:  Junior Ivanoff, MD  PROVIDER:  TODD OZELL SPINNER, MD  MRN: [id number] DOB: 12/20/1950 DATE OF ENCOUNTER: 03/16/2024  Subjective   Chief Complaint: New Consultation (Multiple lipomas)     History of Present Illness:  Patient is referred by Dr. Ivanoff Junior from dermatology for surgical evaluation and management of an enlarging symptomatic soft tissue mass on the left flank and a firm nodular mass involving the right shoulder.  Patient has noted that both lesions have gradually increased in size.  She notes that the left flank mass causes her difficulty with sleeping and lies just beneath her bra line causing discomfort.  She thinks this may also be related to her back pain.  She has had no such previous lesions removed in the past.  She has no history of trauma.  Patient is on Plavix .  She had a series of mini strokes in 2006.  Plavix  is managed by her neurologist.   Review of Systems: A complete review of systems was obtained from the patient.  I have reviewed this information and discussed as appropriate with the patient.  See HPI as well for other ROS.  Review of Systems  Constitutional: Negative.   HENT: Negative.    Eyes: Negative.   Respiratory: Negative.    Cardiovascular: Negative.   Gastrointestinal: Negative.   Genitourinary: Negative.   Musculoskeletal:  Positive for back pain.  Skin:        Nodular mass right shoulder Soft tissue mass left flank  Neurological: Negative.   Endo/Heme/Allergies: Negative.   Psychiatric/Behavioral: Negative.        Medical History: Past Medical History:  Diagnosis Date  . Anxiety   . Asthma without status asthmaticus (HHS-HCC)   . Depression   . GERD (gastroesophageal reflux disease)   . Hyperlipidemia   . Hypertension   . Stroke (CMS/HHS-HCC)     Patient Active Problem List  Diagnosis  . Anxiety  . Pure hypercholesterolemia  . GERD without esophagitis  . Cerebrovascular disease  .  Anxiety disorder  . Barrett's esophagus without dysplasia  . Other symptoms and signs involving cognitive functions and awareness  . Other abnormal glucose  . Fibromyalgia  . Hyperlipidemia  . Major depressive disorder, single episode, in partial remission ()  . Contracture of muscle, other site  . Cough  . Urgency of urination  . Essential (primary) hypertension  . Chest pain  . Mass in chest  . Intra-abdominal and pelvic swelling, mass and lump, unspecified site  . SOB (shortness of breath) on exertion  . Pericardial effusion (HHS-HCC)  . Soft tissue mass  . Skin lesion    Past Surgical History:  Procedure Laterality Date  . HYSTERECTOMY SUPRACERVICAL ABDOMINAL W/REMOVAL TUBES &/OR OVARIES    . tuibal ligation       Allergies  Allergen Reactions  . Hydrocodone -Acetaminophen  Nausea    Current Outpatient Medications on File Prior to Visit  Medication Sig Dispense Refill  . clopidogrel  (PLAVIX ) 75 mg tablet Take 75 mg by mouth once daily.    . EPIPEN  2-PAK 0.3 mg/0.3 mL pen injector   0  . escitalopram  oxalate (LEXAPRO ) 10 MG tablet Take 10 mg by mouth once daily.    SABRA gabapentin (NEURONTIN) 800 MG tablet TK 1 T PO QID  5  . mirabegron (MYRBETRIQ) 50 mg ER tablet Take 50 mg by mouth once daily    . oxyCODONE -acetaminophen  (PERCOCET) 5-325 mg tablet Take 1 tablet by mouth every 6 (six) hours    .  pantoprazole  (PROTONIX ) 40 MG DR tablet Take by mouth.    . primidone (MYSOLINE) 50 MG tablet Take 50 mg by mouth 2 (two) times daily.    . propranoloL (INDERAL) 60 MG tablet Take 60 mg by mouth once daily       . rosuvastatin  (CRESTOR ) 10 MG tablet Take 10 mg by mouth once daily.    . tiZANidine  (ZANAFLEX ) 4 MG tablet Take 4 mg by mouth 3 (three) times daily    . valsartan  (DIOVAN ) 80 MG tablet Take 80 mg by mouth once daily    . hydroCHLOROthiazide  (HYDRODIURIL ) 12.5 MG tablet Take 1 tablet (12.5 mg total) by mouth once daily 30 tablet 11   No current facility-administered  medications on file prior to visit.    Family History  Problem Relation Age of Onset  . Diabetes Mother   . Breast cancer Sister   . Obesity Sister   . Skin cancer Sister      Social History   Tobacco Use  Smoking Status Former  . Current packs/day: 0.50  . Types: Cigarettes  Smokeless Tobacco Never     Social History   Socioeconomic History  . Marital status: Married  Tobacco Use  . Smoking status: Former    Current packs/day: 0.50    Types: Cigarettes  . Smokeless tobacco: Never  Vaping Use  . Vaping status: Never Used  Substance and Sexual Activity  . Alcohol use: Yes    Comment: social  . Drug use: Never  . Sexual activity: Defer   Social Drivers of Health   Financial Resource Strain: Low Risk  (07/23/2021)   Received from [facility]   Overall Financial Resource Strain (CARDIA)   . Difficulty of Paying Living Expenses: Not hard at all  Food Insecurity: No Food Insecurity (07/23/2021)   [facility]   Hunger Vital Sign   . Within the past 12 months, you worried that your food would run out before you got the money to buy more.: Never true   . Within the past 12 months, the food you bought just didn't last and you didn't have money to get more.: Never true  Transportation Needs: No Transportation Needs (07/23/2021)   Received from [facility] - Transportation   . Lack of Transportation (Medical): No   . Lack of Transportation (Non-Medical): No  Physical Activity: Inactive (07/23/2021)   Received from [facility]   Exercise Vital Sign   . On average, how many days per week do you engage in moderate to strenuous exercise (like a brisk walk)?: 0 days   . On average, how many minutes do you engage in exercise at this level?: 0 min  Stress: No Stress Concern Present (07/23/2021)   Received from [facility] - Occupational Stress Questionnaire   . Feeling of Stress : Only a little  Social  Connections: Moderately Integrated (07/23/2021)   Received from [facility]   Social Connection and Isolation Panel   . In a typical week, how many times do you talk on the phone with family, friends, or neighbors?: More than three times a week   . How often do you get together with friends or relatives?: More than three times a week   . How often do you attend church or religious services?: More than 4 times per year   . Do you belong to any clubs or organizations such as church groups, unions, fraternal or  athletic groups, or school groups?: No   . How often do you attend meetings of the clubs or organizations you belong to?: Never   . Are you married, widowed, divorced, separated, never married, or living with a partner?: Married  Housing Stability: Unknown (03/16/2024)   Housing Stability Vital Sign   . Homeless in the Last Year: No    Objective:    Vitals:   03/16/24 1015 03/16/24 1018  BP: 99/66   Pulse: 79   Temp: 36.6 C (97.8 F)   TempSrc: Oral   SpO2: 97%   Weight: 90.7 kg (200 lb)   Height: 170.2 cm (5' 7)   PainSc:  0-No pain    Body mass index is 31.32 kg/m.  Physical Exam   GENERAL APPEARANCE Comfortable, no acute issues Development: normal Gross deformities: none  SKIN Rash, lesions, ulcers: none Induration, erythema: none Nodules: In the right posterior shoulder at the edge of the trapezius is a relatively firm nodular mass which appears to be attached to the underside of the dermis measuring approximately 1 cm in diameter. In the left flank beneath the bra line is a 5 cm x 7 cm x 3 cm soft tissue mass which is discrete, mobile, and mildly tender consistent with a lipoma.  EYES Conjunctiva and lids: normal Pupils: equal  EARS, NOSE, MOUTH, THROAT External ears: no lesion or deformity External nose: no lesion or deformity Hearing: grossly normal  CHEST/CV Not assessed  ABDOMEN Not assessed  GENITOURINARY/RECTAL Not  assessed  MUSCULOSKELETAL Station and gait: normal Digits and nails: no clubbing or cyanosis Muscle strength: grossly normal all extremities Deformity: none  PSYCHIATRIC Oriented to person, place, and time: yes Mood and affect: normal for situation Judgment and insight: appropriate for situation    Assessment and Plan:  Diagnoses and all orders for this visit:  Soft tissue mass  Skin lesion    Patient is referred by her dermatologist, Dr. Niels Bless, for surgical evaluation and management of an enlarging soft tissue mass in the left flank and a nodular skin lesion on the right shoulder.  Today we examined both of these lesions.  Both are gradually increasing in size.  The lesion in the left flank is symptomatic, preventing the patient from sleeping comfortably, and perhaps contributing to her back pain.  She desires surgical removal of both lesions.  We discussed surgical excision under local anesthesia with sedation.  We discussed the postoperative recovery to be anticipated.  We discussed postoperative wound care.  We discussed the fact we would send both lesions to pathology for evaluation.  The patient understands and wishes to proceed in the near future.  Patient does take Plavix  with a history of neurologic events dating back to 2006.  We will obtain preoperative clearance for discontinuing the Plavix  for 5 to 7 days prior to surgery from her neurologist.  +++++++++++++++++++++++++++++++++++++++++++++  You are being scheduled for surgery.  You should hear from our office's scheduling department within 3 business days about the location, date, and time of surgery.  We try to make accommodations for patient's preferences in scheduling surgery, but sometimes the Operating Room's schedule or Dr. Ronold schedule prevents us  from making those accommodations.  If you have not heard from our office within 3 business days, call the office and ask for Dr. Ronold nurse  [place]).  [facility] OFFICE: [phone]   Krystal Spinner MD [facility] Surgery Office: [phone]

## 2024-04-08 ENCOUNTER — Encounter (HOSPITAL_BASED_OUTPATIENT_CLINIC_OR_DEPARTMENT_OTHER): Payer: Self-pay | Admitting: Surgery

## 2024-04-08 DIAGNOSIS — M7989 Other specified soft tissue disorders: Secondary | ICD-10-CM | POA: Diagnosis present

## 2024-04-08 DIAGNOSIS — L989 Disorder of the skin and subcutaneous tissue, unspecified: Secondary | ICD-10-CM

## 2024-04-08 NOTE — H&P (Signed)
 REFERRING PHYSICIAN: Junior Ivanoff, MD  PROVIDER: Ressie Slevin OZELL SPINNER, MD   Chief Complaint: New Consultation (Multiple lipomas)  History of Present Illness:  Patient is referred by Dr. Ivanoff Junior from dermatology for surgical evaluation and management of an enlarging symptomatic soft tissue mass on the left flank and a firm nodular mass involving the right shoulder. Patient has noted that both lesions have gradually increased in size. She notes that the left flank mass causes her difficulty with sleeping and lies just beneath her bra line causing discomfort. She thinks this may also be related to her back pain. She has had no such previous lesions removed in the past. She has no history of trauma. Patient is on Plavix . She had a series of mini strokes in 2006. Plavix  is managed by her neurologist.  Review of Systems: A complete review of systems was obtained from the patient. I have reviewed this information and discussed as appropriate with the patient. See HPI as well for other ROS.  Review of Systems  Constitutional: Negative.  HENT: Negative.  Eyes: Negative.  Respiratory: Negative.  Cardiovascular: Negative.  Gastrointestinal: Negative.  Genitourinary: Negative.  Musculoskeletal: Positive for back pain.  Skin:  Nodular mass right shoulder Soft tissue mass left flank  Neurological: Negative.  Endo/Heme/Allergies: Negative.  Psychiatric/Behavioral: Negative.    Medical History: Past Medical History:  Diagnosis Date  Anxiety  Asthma without status asthmaticus (HHS-HCC)  Depression  GERD (gastroesophageal reflux disease)  Hyperlipidemia  Hypertension  Stroke (CMS/HHS-HCC)   Patient Active Problem List  Diagnosis  Anxiety  Pure hypercholesterolemia  GERD without esophagitis  Cerebrovascular disease  Anxiety disorder  Barrett's esophagus without dysplasia  Other symptoms and signs involving cognitive functions and awareness  Other abnormal glucose   Fibromyalgia  Hyperlipidemia  Major depressive disorder, single episode, in partial remission ()  Contracture of muscle, other site  Cough  Urgency of urination  Essential (primary) hypertension  Chest pain  Mass in chest  Intra-abdominal and pelvic swelling, mass and lump, unspecified site  SOB (shortness of breath) on exertion  Pericardial effusion (HHS-HCC)  Soft tissue mass  Skin lesion   Past Surgical History:  Procedure Laterality Date  HYSTERECTOMY SUPRACERVICAL ABDOMINAL W/REMOVAL TUBES &/OR OVARIES  tuibal ligation    Allergies  Allergen Reactions  Hydrocodone -Acetaminophen  Nausea   Current Outpatient Medications on File Prior to Visit  Medication Sig Dispense Refill  clopidogrel  (PLAVIX ) 75 mg tablet Take 75 mg by mouth once daily.  EPIPEN  2-PAK 0.3 mg/0.3 mL pen injector 0  escitalopram  oxalate (LEXAPRO ) 10 MG tablet Take 10 mg by mouth once daily.  gabapentin (NEURONTIN) 800 MG tablet TK 1 T PO QID 5  mirabegron (MYRBETRIQ) 50 mg ER tablet Take 50 mg by mouth once daily  oxyCODONE-acetaminophen  (PERCOCET) 5-325 mg tablet Take 1 tablet by mouth every 6 (six) hours  pantoprazole  (PROTONIX ) 40 MG DR tablet Take by mouth.  primidone (MYSOLINE) 50 MG tablet Take 50 mg by mouth 2 (two) times daily.  propranoloL (INDERAL) 60 MG tablet Take 60 mg by mouth once daily  rosuvastatin  (CRESTOR ) 10 MG tablet Take 10 mg by mouth once daily.  tiZANidine  (ZANAFLEX ) 4 MG tablet Take 4 mg by mouth 3 (three) times daily  valsartan  (DIOVAN ) 80 MG tablet Take 80 mg by mouth once daily  hydroCHLOROthiazide  (HYDRODIURIL ) 12.5 MG tablet Take 1 tablet (12.5 mg total) by mouth once daily 30 tablet 11   No current facility-administered medications on file prior to visit.  Family History  Problem Relation Age of Onset  Diabetes Mother  Breast cancer Sister  Obesity Sister  Skin cancer Sister    Social History   Tobacco Use  Smoking Status Former  Current packs/day: 0.50   Types: Cigarettes  Smokeless Tobacco Never    Social History   Socioeconomic History  Marital status: Married  Tobacco Use  Smoking status: Former  Current packs/day: 0.50  Types: Cigarettes  Smokeless tobacco: Never  Vaping Use  Vaping status: Never Used  Substance and Sexual Activity  Alcohol use: Yes  Comment: social  Drug use: Never  Sexual activity: Defer   Social Drivers of Health   Financial Resource Strain: Low Risk (07/23/2021)  [facility]  Overall Financial Resource Strain (CARDIA)  Difficulty of Paying Living Expenses: Not hard at all  Food Insecurity: No Food Insecurity (07/23/2021)  [facility]  Hunger Vital Sign  Within the past 12 months, you worried that your food would run out before you got the money to buy more.: Never true  Within the past 12 months, the food you bought just didn't last and you didn't have money to get more.: Never true  Transportation Needs: No Transportation Needs (07/23/2021)  Received from [facility] - Transportation  Lack of Transportation (Medical): No  Lack of Transportation (Non-Medical): No  Physical Activity: Inactive (07/23/2021)  Received from [facility]  Exercise Vital Sign  On average, how many days per week do you engage in moderate to strenuous exercise (like a brisk walk)?: 0 days  On average, how many minutes do you engage in exercise at this level?: 0 min  Stress: No Stress Concern Present (07/23/2021)  Received from [facility] of Occupational Health - Occupational Stress Questionnaire  Feeling of Stress : Only a little  Social Connections: Moderately Integrated (07/23/2021)  Received from [facility]  Social Connection and Isolation Panel  In a typical week, how many times do you talk on the phone with family, friends, or neighbors?: More than three times a week  How often do you get together with friends or relatives?: More than three times a week   How often do you attend church or religious services?: More than 4 times per year  Do you belong to any clubs or organizations such as church groups, unions, fraternal or athletic groups, or school groups?: No  How often do you attend meetings of the clubs or organizations you belong to?: Never  Are you married, widowed, divorced, separated, never married, or living with a partner?: Married  Housing Stability: Unknown (03/16/2024)  Housing Stability Vital Sign  Homeless in the Last Year: No   Objective:   Vitals:  BP: 99/66  Pulse: 79  Temp: 36.6 C (97.8 F)  TempSrc: Oral  SpO2: 97%  Weight: 90.7 kg (200 lb)  Height: 170.2 cm (5' 7)  PainSc: 0-No pain   Body mass index is 31.32 kg/m.  Physical Exam   GENERAL APPEARANCE Comfortable, no acute issues Development: normal Gross deformities: none  SKIN Rash, lesions, ulcers: none Induration, erythema: none Nodules: In the right posterior shoulder at the edge of the trapezius is a relatively firm nodular mass which appears to be attached to the underside of the dermis measuring approximately 1 cm in diameter. In the left flank beneath the bra line is a 5 cm x 7 cm x 3 cm soft tissue mass which is discrete, mobile, and mildly tender consistent with a lipoma.  EYES Conjunctiva and lids: normal Pupils: equal  EARS, NOSE, MOUTH, THROAT External ears: no lesion or deformity External nose: no lesion or deformity Hearing: grossly normal  CHEST/CV Not assessed  ABDOMEN Not assessed  GENITOURINARY/RECTAL Not assessed  MUSCULOSKELETAL Station and gait: normal Digits and nails: no clubbing or cyanosis Muscle strength: grossly normal all extremities Deformity: none  PSYCHIATRIC Oriented to person, place, and time: yes Mood and affect: normal for situation Judgment and insight: appropriate for situation   Assessment and Plan:   Soft tissue mass Skin lesion  Patient is referred by her dermatologist, Dr. Niels Bless, for surgical evaluation and management of an enlarging soft tissue mass in the left flank and a nodular skin lesion on the right shoulder.  Today we examined both of these lesions. Both are gradually increasing in size. The lesion in the left flank is symptomatic, preventing the patient from sleeping comfortably, and perhaps contributing to her back pain. She desires surgical removal of both lesions.  We discussed surgical excision under local anesthesia with sedation. We discussed the postoperative recovery to be anticipated. We discussed postoperative wound care. We discussed the fact we would send both lesions to pathology for evaluation. The patient understands and wishes to proceed in the near future.  Patient does take Plavix  with a history of neurologic events dating back to 2006. We will obtain preoperative clearance for discontinuing the Plavix  for 5 to 7 days prior to surgery from her neurologist.  Krystal Spinner, MD [facility] Surgery [facility] practice Office: [phone]

## 2024-04-12 ENCOUNTER — Encounter (HOSPITAL_BASED_OUTPATIENT_CLINIC_OR_DEPARTMENT_OTHER)
Admission: RE | Admit: 2024-04-12 | Discharge: 2024-04-12 | Disposition: A | Discharge status: Home / Self Care | Source: Ambulatory Visit | Attending: Surgery | Admitting: Surgery

## 2024-04-12 DIAGNOSIS — Z01810 Encounter for preprocedural cardiovascular examination: Secondary | ICD-10-CM | POA: Insufficient documentation

## 2024-04-12 DIAGNOSIS — Z01818 Encounter for other preprocedural examination: Secondary | ICD-10-CM | POA: Diagnosis present

## 2024-04-12 MED ORDER — CHLORHEXIDINE GLUCONATE CLOTH 2 % EX PADS: 6.0000 | MEDICATED_PAD | Freq: Once | CUTANEOUS | Status: DC

## 2024-04-12 NOTE — Progress Notes (Signed)

## 2024-04-14 ENCOUNTER — Other Ambulatory Visit: Payer: Self-pay

## 2024-04-14 ENCOUNTER — Ambulatory Visit (HOSPITAL_BASED_OUTPATIENT_CLINIC_OR_DEPARTMENT_OTHER): Payer: Self-pay | Admitting: Anesthesiology

## 2024-04-14 ENCOUNTER — Ambulatory Visit (HOSPITAL_BASED_OUTPATIENT_CLINIC_OR_DEPARTMENT_OTHER)
Admission: RE | Admit: 2024-04-14 | Discharge: 2024-04-14 | Disposition: A | Discharge status: Home / Self Care | Attending: Surgery | Admitting: Surgery

## 2024-04-14 ENCOUNTER — Encounter (HOSPITAL_BASED_OUTPATIENT_CLINIC_OR_DEPARTMENT_OTHER): Payer: Self-pay | Admitting: Surgery

## 2024-04-14 ENCOUNTER — Encounter (HOSPITAL_BASED_OUTPATIENT_CLINIC_OR_DEPARTMENT_OTHER)
Admission: RE | Disposition: A | Discharge status: Home / Self Care | Payer: Self-pay | Source: Home / Self Care | Attending: Surgery

## 2024-04-14 DIAGNOSIS — Z01818 Encounter for other preprocedural examination: Secondary | ICD-10-CM

## 2024-04-14 DIAGNOSIS — Z87891 Personal history of nicotine dependence: Secondary | ICD-10-CM

## 2024-04-14 DIAGNOSIS — M1990 Unspecified osteoarthritis, unspecified site: Secondary | ICD-10-CM | POA: Insufficient documentation

## 2024-04-14 DIAGNOSIS — L989 Disorder of the skin and subcutaneous tissue, unspecified: Secondary | ICD-10-CM | POA: Diagnosis present

## 2024-04-14 DIAGNOSIS — Z79899 Other long term (current) drug therapy: Secondary | ICD-10-CM | POA: Diagnosis not present

## 2024-04-14 DIAGNOSIS — R0602 Shortness of breath: Secondary | ICD-10-CM | POA: Insufficient documentation

## 2024-04-14 DIAGNOSIS — F32A Depression, unspecified: Secondary | ICD-10-CM | POA: Insufficient documentation

## 2024-04-14 DIAGNOSIS — F419 Anxiety disorder, unspecified: Secondary | ICD-10-CM | POA: Insufficient documentation

## 2024-04-14 DIAGNOSIS — K219 Gastro-esophageal reflux disease without esophagitis: Secondary | ICD-10-CM | POA: Diagnosis not present

## 2024-04-14 DIAGNOSIS — M549 Dorsalgia, unspecified: Secondary | ICD-10-CM | POA: Insufficient documentation

## 2024-04-14 DIAGNOSIS — M797 Fibromyalgia: Secondary | ICD-10-CM | POA: Diagnosis not present

## 2024-04-14 DIAGNOSIS — Z8673 Personal history of transient ischemic attack (TIA), and cerebral infarction without residual deficits: Secondary | ICD-10-CM | POA: Diagnosis not present

## 2024-04-14 DIAGNOSIS — Z7902 Long term (current) use of antithrombotics/antiplatelets: Secondary | ICD-10-CM | POA: Insufficient documentation

## 2024-04-14 DIAGNOSIS — M7989 Other specified soft tissue disorders: Secondary | ICD-10-CM

## 2024-04-14 DIAGNOSIS — D171 Benign lipomatous neoplasm of skin and subcutaneous tissue of trunk: Secondary | ICD-10-CM | POA: Insufficient documentation

## 2024-04-14 DIAGNOSIS — I10 Essential (primary) hypertension: Secondary | ICD-10-CM | POA: Diagnosis not present

## 2024-04-14 DIAGNOSIS — J45909 Unspecified asthma, uncomplicated: Secondary | ICD-10-CM | POA: Insufficient documentation

## 2024-04-14 HISTORY — PX: EXCISION MASS LOWER EXTREMETIES: SHX6705

## 2024-04-14 HISTORY — DX: Essential tremor: G25.0

## 2024-04-14 HISTORY — PX: EXCISION OF BACK LESION: SHX6597

## 2024-04-14 HISTORY — DX: Overactive bladder: N32.81

## 2024-04-14 SURGERY — EXCISION, LESION, BACK
Anesthesia: Monitor Anesthesia Care | Laterality: Right

## 2024-04-14 MED ORDER — BUPIVACAINE HCL (PF) 0.5 % IJ SOLN
INTRAMUSCULAR | Status: DC | PRN
  Administered 2024-04-14: 35 mL

## 2024-04-14 MED ORDER — TRAMADOL HCL 50 MG PO TABS: 50.0000 mg | ORAL_TABLET | Freq: Four times a day (QID) | ORAL | 0 refills | Status: AC | PRN

## 2024-04-14 MED ORDER — KETOROLAC TROMETHAMINE 15 MG/ML IJ SOLN
INTRAMUSCULAR | Status: DC | PRN
  Administered 2024-04-14: 15 mg via INTRAVENOUS

## 2024-04-14 MED ORDER — OXYCODONE HCL 5 MG PO TABS: 5.0000 mg | ORAL_TABLET | Freq: Once | ORAL | Status: DC | PRN

## 2024-04-14 MED ORDER — ACETAMINOPHEN 10 MG/ML IV SOLN: 1000.0000 mg | Freq: Once | INTRAVENOUS | Status: DC | PRN

## 2024-04-14 MED ORDER — FENTANYL CITRATE (PF) 100 MCG/2ML IJ SOLN: 25.0000 ug | INTRAMUSCULAR | Status: DC | PRN

## 2024-04-14 MED ORDER — 0.9 % SODIUM CHLORIDE (POUR BTL) OPTIME
TOPICAL | Status: DC | PRN
  Administered 2024-04-14: 1000 mL

## 2024-04-14 MED ORDER — ONDANSETRON HCL 4 MG/2ML IJ SOLN
INTRAMUSCULAR | Status: DC | PRN
  Administered 2024-04-14: 4 mg via INTRAVENOUS

## 2024-04-14 MED ORDER — OXYCODONE HCL 5 MG/5ML PO SOLN: 5.0000 mg | Freq: Once | ORAL | Status: DC | PRN

## 2024-04-14 MED ORDER — PROPOFOL 10 MG/ML IV BOLUS
INTRAVENOUS | Status: DC | PRN
  Administered 2024-04-14: 40 mg via INTRAVENOUS
  Administered 2024-04-14 (×3): 20 mg via INTRAVENOUS

## 2024-04-14 MED ORDER — PROPOFOL 500 MG/50ML IV EMUL
INTRAVENOUS | Status: DC | PRN
  Administered 2024-04-14: 100 ug/kg/min via INTRAVENOUS

## 2024-04-14 MED ORDER — BUPIVACAINE HCL (PF) 0.5 % IJ SOLN
INTRAMUSCULAR | Status: AC
  Filled 2024-04-14: qty 30

## 2024-04-14 MED ORDER — CEFAZOLIN SODIUM-DEXTROSE 2-4 GM/100ML-% IV SOLN
2.0000 g | INTRAVENOUS | Status: AC
  Administered 2024-04-14: 2 g via INTRAVENOUS

## 2024-04-14 MED ORDER — FENTANYL CITRATE (PF) 100 MCG/2ML IJ SOLN
INTRAMUSCULAR | Status: AC
  Filled 2024-04-14: qty 2

## 2024-04-14 MED ORDER — PHENYLEPHRINE 80 MCG/ML (10ML) SYRINGE FOR IV PUSH (FOR BLOOD PRESSURE SUPPORT)
PREFILLED_SYRINGE | INTRAVENOUS | Status: AC
  Filled 2024-04-14: qty 10

## 2024-04-14 MED ORDER — KETOROLAC TROMETHAMINE 30 MG/ML IJ SOLN
INTRAMUSCULAR | Status: AC
  Filled 2024-04-14: qty 1

## 2024-04-14 MED ORDER — CEFAZOLIN SODIUM-DEXTROSE 2-4 GM/100ML-% IV SOLN
INTRAVENOUS | Status: AC
  Filled 2024-04-14: qty 100

## 2024-04-14 MED ORDER — ONDANSETRON HCL 4 MG/2ML IJ SOLN
INTRAMUSCULAR | Status: AC
  Filled 2024-04-14: qty 2

## 2024-04-14 MED ORDER — PHENYLEPHRINE HCL-NACL 20-0.9 MG/250ML-% IV SOLN
INTRAVENOUS | Status: DC | PRN
Start: 2024-04-14 — End: 2024-04-14
  Administered 2024-04-14: 40 ug/min via INTRAVENOUS

## 2024-04-14 MED ORDER — DROPERIDOL 2.5 MG/ML IJ SOLN: 0.6250 mg | Freq: Once | INTRAMUSCULAR | Status: DC | PRN

## 2024-04-14 MED ORDER — LACTATED RINGERS IV SOLN: INTRAVENOUS | Status: DC

## 2024-04-14 MED ORDER — PHENYLEPHRINE 80 MCG/ML (10ML) SYRINGE FOR IV PUSH (FOR BLOOD PRESSURE SUPPORT)
PREFILLED_SYRINGE | INTRAVENOUS | Status: DC | PRN
  Administered 2024-04-14: 160 ug via INTRAVENOUS

## 2024-04-14 MED ORDER — FENTANYL CITRATE (PF) 100 MCG/2ML IJ SOLN
INTRAMUSCULAR | Status: DC | PRN
  Administered 2024-04-14 (×2): 25 ug via INTRAVENOUS

## 2024-04-14 SURGICAL SUPPLY — 35 items
BLADE SURG 15 STRL LF DISP TIS (BLADE) ×3 IMPLANT
CANISTER SUCT 1200ML W/VALVE (MISCELLANEOUS) IMPLANT
CHLORAPREP W/TINT 26 (MISCELLANEOUS) ×3 IMPLANT
CLEANER CAUTERY TIP PAD (MISCELLANEOUS) IMPLANT
COVER BACK TABLE 60X90IN (DRAPES) ×3 IMPLANT
COVER MAYO STAND STRL (DRAPES) ×3 IMPLANT
DERMABOND ADVANCED .7 DNX12 (GAUZE/BANDAGES/DRESSINGS) ×3 IMPLANT
DRAPE LAPAROTOMY 100X72 PEDS (DRAPES) IMPLANT
DRAPE U-SHAPE 76X120 STRL (DRAPES) IMPLANT
DRAPE UTILITY XL STRL (DRAPES) ×3 IMPLANT
DRSG TEGADERM 4X4.75 (GAUZE/BANDAGES/DRESSINGS) IMPLANT
ELECTRODE REM PT RTRN 9FT ADLT (ELECTROSURGICAL) ×3 IMPLANT
GAUZE SPONGE 4X4 12PLY STRL LF (GAUZE/BANDAGES/DRESSINGS) ×3 IMPLANT
GLOVE SURG ORTHO 8.0 STRL STRW (GLOVE) ×3 IMPLANT
GOWN STRL REUS W/ TWL LRG LVL3 (GOWN DISPOSABLE) ×3 IMPLANT
GOWN STRL REUS W/ TWL XL LVL3 (GOWN DISPOSABLE) ×3 IMPLANT
NDL HYPO 25X1 1.5 SAFETY (NEEDLE) ×3 IMPLANT
NEEDLE HYPO 25X1 1.5 SAFETY (NEEDLE) ×2 IMPLANT
PACK BASIN DAY SURGERY FS (CUSTOM PROCEDURE TRAY) ×3 IMPLANT
PENCIL SMOKE EVACUATOR (MISCELLANEOUS) ×3 IMPLANT
SHEET MEDIUM DRAPE 40X70 STRL (DRAPES) IMPLANT
SLEEVE SCD COMPRESS KNEE MED (STOCKING) IMPLANT
SPIKE FLUID TRANSFER (MISCELLANEOUS) IMPLANT
STRIP CLOSURE SKIN 1/2X4 (GAUZE/BANDAGES/DRESSINGS) IMPLANT
SUCTION TUBE FRAZIER 10FR DISP (SUCTIONS) IMPLANT
SUT ETHILON 3 0 PS 1 (SUTURE) IMPLANT
SUT ETHILON 4 0 PS 2 18 (SUTURE) IMPLANT
SUT MNCRL AB 3-0 PS2 18 (SUTURE) IMPLANT
SUT MNCRL AB 4-0 PS2 18 (SUTURE) IMPLANT
SUT VIC AB 4-0 PS2 18 (SUTURE) IMPLANT
SUT VICRYL 3-0 CR8 SH (SUTURE) ×3 IMPLANT
SYR CONTROL 10ML LL (SYRINGE) ×3 IMPLANT
TOWEL GREEN STERILE FF (TOWEL DISPOSABLE) ×6 IMPLANT
TUBE CONNECTING 20X1/4 (TUBING) IMPLANT
YANKAUER SUCT BULB TIP NO VENT (SUCTIONS) IMPLANT

## 2024-04-14 NOTE — Anesthesia Postprocedure Evaluation (Signed)
 Anesthesia Post Note  Patient: Robin Bryan  Procedure(s) Performed: EXCISION, LESION, BACK (Left) EXCISION MASS LOWER EXTREMITIES (Right)     Patient location during evaluation: PACU Anesthesia Type: MAC Level of consciousness: awake and alert Pain management: pain level controlled Vital Signs Assessment: post-procedure vital signs reviewed and stable Respiratory status: spontaneous breathing, nonlabored ventilation, respiratory function stable and patient connected to nasal cannula oxygen Cardiovascular status: stable and blood pressure returned to baseline Postop Assessment: no apparent nausea or vomiting Anesthetic complications: no   No notable events documented.  Last Vitals:  Vitals:   04/14/24 1115 04/14/24 1133  BP: (!) 105/57 121/81  Pulse: (!) 57 74  Resp: 12 18  Temp:  (!) 36.2 C  SpO2: 95% 96%    Last Pain:  Vitals:   04/14/24 1133  TempSrc: Temporal  PainSc: 0-No pain                 Thom JONELLE Peoples

## 2024-04-14 NOTE — Interval H&P Note (Signed)
 History and Physical Interval Note:  04/14/2024 9:16 AM  Robin Bryan  has presented today for surgery, with the diagnosis of SOFT TISSUE MASS CUTANEOUS NODULE.  The various methods of treatment have been discussed with the patient and family. After consideration of risks, benefits and other options for treatment, the patient has consented to    Procedure(s) with comments: EXCISION, LESION, BACK (Left) - EXCISION SOFT TISSUE MASS LEFT FLANK  EXCISION CUTANEOUS NODULE RIGHT SHOULDER EXCISION MASS LOWER EXTREMITIES (Right) as a surgical intervention.    The patient's history has been reviewed, patient examined, no change in status, stable for surgery.  I have reviewed the patient's chart and labs.  Questions were answered to the patient's satisfaction.    Krystal Spinner, MD [facility] Surgery [facility] practice Office: [phone]  Krystal Spinner

## 2024-04-14 NOTE — Anesthesia Preprocedure Evaluation (Addendum)
 Anesthesia Evaluation  Patient identified by MRN, date of birth, ID band Patient awake    Reviewed: Allergy & Precautions, H&P , NPO status , Patient's Chart, lab work & pertinent test results  History of Anesthesia Complications Negative for: history of anesthetic complications  Airway Mallampati: II  TM Distance: >3 FB Neck ROM: Full    Dental no notable dental hx.    Pulmonary asthma , former smoker   Pulmonary exam normal breath sounds clear to auscultation       Cardiovascular hypertension, (-) angina Normal cardiovascular exam Rhythm:Regular Rate:Normal     Neuro/Psych  PSYCHIATRIC DISORDERS Anxiety Depression    CVA    GI/Hepatic ,GERD  ,,FLD Hx of esophageal stricture Barretts    Endo/Other  negative endocrine ROS    Renal/GU negative Renal ROS  negative genitourinary   Musculoskeletal  (+) Arthritis ,  Fibromyalgia -  Abdominal   Peds negative pediatric ROS (+)  Hematology negative hematology ROS (+)   Anesthesia Other Findings Soft tissue mass on left back  Reproductive/Obstetrics negative OB ROS                              Anesthesia Physical Anesthesia Plan  ASA: 3  Anesthesia Plan: MAC   Post-op Pain Management: Tylenol  PO (pre-op)*   Induction: Intravenous  PONV Risk Score and Plan: 3 and Ondansetron , Dexamethasone , Midazolam  and Treatment may vary due to age or medical condition  Airway Management Planned: Natural Airway and Simple Face Mask  Additional Equipment: None  Intra-op Plan:   Post-operative Plan: Extubation in OR  Informed Consent: I have reviewed the patients History and Physical, chart, labs and discussed the procedure including the risks, benefits and alternatives for the proposed anesthesia with the patient or authorized representative who has indicated his/her understanding and acceptance.     Dental advisory given  Plan Discussed  with: CRNA  Anesthesia Plan Comments:          Anesthesia Quick Evaluation

## 2024-04-14 NOTE — Transfer of Care (Signed)
 Immediate Anesthesia Transfer of Care Note  Patient: Robin Bryan  Procedure(s) Performed: EXCISION, LESION, BACK (Left) EXCISION MASS LOWER EXTREMITIES (Right)  Patient Location: PACU  Anesthesia Type:MAC  Level of Consciousness: awake, alert , oriented, and patient cooperative  Airway & Oxygen Therapy: Patient Spontanous Breathing  Post-op Assessment: Report given to RN and Post -op Vital signs reviewed and stable  Post vital signs: Reviewed and stable  Last Vitals:  Vitals Value Taken Time  BP 90/62 04/14/24 10:36  Temp    Pulse 60 04/14/24 10:39  Resp 12 04/14/24 10:39  SpO2 97 % 04/14/24 10:39  Vitals shown include unfiled device data.  Last Pain:  Vitals:   04/14/24 0826  TempSrc: Temporal  PainSc: 0-No pain         Complications: No notable events documented.

## 2024-04-14 NOTE — Anesthesia Procedure Notes (Signed)
 Date/Time: 04/14/2024 9:30 AM  Performed by: Denton Niels CROME, CRNAPre-anesthesia Checklist: Patient identified, Emergency Drugs available, Suction available, Patient being monitored and Timeout performed Oxygen Delivery Method: Simple face mask Induction Type: IV induction Placement Confirmation: positive ETCO2 Dental Injury: Teeth and Oropharynx as per pre-operative assessment

## 2024-04-14 NOTE — Discharge Instructions (Addendum)
  [facility] -- DISCHARGE INSTRUCTIONS  REMINDER:   Carry a list of your medications and allergies with you at all times  Call your pharmacy at least 1 week in advance to refill prescriptions  Do not mix any prescribed pain medicine with alcohol  Do not drive any motor vehicles while taking pain medication  Take medications with food unless otherwise directed  Follow-up appointments (date to return to physician): Please call [phone] to confirm your follow up appointment with your surgeon.  Call your Surgeon if you have:  Temperature greater than 101.0  Persistent nausea and vomiting  Severe uncontrolled pain  Redness, tenderness, or signs of infection (pain, swelling, redness, odor or green/yellow discharge around the site)  Difficulty breathing, headache or visual disturbances  Hives  Persistent dizziness or light-headedness  Any other questions or concerns you may have after discharge  In an emergency, call 911 or go to an Emergency Department at a nearby hospital.  Diet: Begin with liquids, and if they are tolerated, resume your usual diet.  Avoid spicy, greasy or heavy foods.  If you have nausea or vomiting, go back to liquids.  If you cannot keep liquids down, call your doctor.  Avoid alcohol consumption while on prescription pain medications. Good nutrition promotes healing. Increase fiber and fluids.   ADDITIONAL INSTRUCTIONS: Leave Dermabond in place for 7 to 10 days.  May shower.  Use ice pack for the first 2 to 3 days as needed for comfort.  Prescription for pain medication is available at your pharmacy.  May also use Tylenol  or Advil  as needed for minor discomfort.  [facility] Office: [phone]   No ibuprofen /motrin  before 6:30pm   Post Anesthesia Home Care Instructions  Activity: Get plenty of rest for the remainder of the day. A responsible individual must stay with you for 24 hours following the procedure.  For the next 24  hours, DO NOT: -Drive a car -Advertising copywriter -Drink alcoholic beverages -Take any medication unless instructed by your physician -Make any legal decisions or sign important papers.  Meals: Start with liquid foods such as gelatin or soup. Progress to regular foods as tolerated. Avoid greasy, spicy, heavy foods. If nausea and/or vomiting occur, drink only clear liquids until the nausea and/or vomiting subsides. Call your physician if vomiting continues.  Special Instructions/Symptoms: Your throat may feel dry or sore from the anesthesia or the breathing tube placed in your throat during surgery. If this causes discomfort, gargle with warm salt water. The discomfort should disappear within 24 hours.

## 2024-04-14 NOTE — Op Note (Signed)
 Operative Note  Pre-operative Diagnosis:  1. Soft tissue mass left flank  2. Subcutaneous mass right shoulder  Post-operative Diagnosis:  same  Surgeon:  Krystal Spinner, MD  Assistant:  none   Procedure:  1.  Excision of soft tissue mass left flank, subcutaneous, 8 x 6 x 3 cm;  2.  Excision of subcutaneous mass right posterior shoulder, 2 x 2 x 1.5 cm  Anesthesia:  local with IV sedation (MAC)  Estimated Blood Loss:  minimal  Drains: none         Specimen: to pathology  Indications:  Patient is referred by Dr. Niels Bless from dermatology for surgical evaluation and management of an enlarging symptomatic soft tissue mass on the left flank and a firm nodular mass involving the right shoulder. Patient has noted that both lesions have gradually increased in size. She notes that the left flank mass causes her difficulty with sleeping and lies just beneath her bra line causing discomfort. She thinks this may also be related to her back pain. She has had no such previous lesions removed in the past. She has no history of trauma. Patient is on Plavix . She had a series of mini strokes in 2006. Plavix  is managed by her neurologist.  Patient now comes to surgery for excision of both masses.  Procedure:  The patient was seen in the pre-op holding area. The risks, benefits, complications, treatment options, and expected outcomes were previously discussed with the patient. The patient agreed with the proposed plan and has signed the informed consent form.  The patient was brought to the operating room by the surgical team, identified as Robin Bryan and the procedure verified. A time out was completed and the above information confirmed.  Following administration of intravenous sedation, the patient is positioned and then prepped and draped in the usual aseptic fashion.  The mass on the left flank was addressed first.  Skin was anesthetized with local anesthetic.  Local anesthetic was  infiltrated into the deeper subcutaneous tissues.  An elliptical incision was made with a #15 blade so as to encompass a small protrusion on the skin.  Dissection was then carried into the subcutaneous tissues using the electrocautery for hemostasis.  A soft tissue mass consistent with a lobulated lipoma was identified.  It was dissected out and excised in its entirety using the electrocautery for hemostasis.  The mass measured 8 x 6 x 3 cm in size and was submitted in its entirety to pathology for review.  Subcutaneous tissues were closed with interrupted 3-0 Vicryl sutures.  Skin was closed with a running 4-0 Monocryl subcuticular suture.  Dermabond was applied as dressing.  Next we turned our attention to the subcutaneous mass at the right posterior shoulder.  Local anesthetic was infiltrated into the skin and subcutaneous tissues.  Again an elliptical incision was made so as to encompass the overlying skin.  Dissection was carried into the subcutaneous tissues and the mass was identified.  It was excised with a margin of normal adipose tissue using the electrocautery for hemostasis.  The specimen measured 2 x 2 x 1.5 cm and was submitted in its entirety to pathology for review.  Subcutaneous tissues were closed with interrupted 3-0 Vicryl sutures.  Skin was closed with a running 4-0 Monocryl subcuticular suture.  Wound was washed and dried and Dermabond was applied as dressing.  Patient was awakened from sedation and transferred to the recovery room in stable condition.  The patient tolerated the procedure well.  Krystal Spinner, MD [facility] Surgery Office: [phone]

## 2024-04-15 ENCOUNTER — Encounter (HOSPITAL_BASED_OUTPATIENT_CLINIC_OR_DEPARTMENT_OTHER): Payer: Self-pay | Admitting: Surgery

## 2024-04-18 LAB — SURGICAL PATHOLOGY

## 2024-06-03 ENCOUNTER — Other Ambulatory Visit: Payer: Self-pay | Admitting: Internal Medicine

## 2024-08-27 ENCOUNTER — Other Ambulatory Visit: Payer: Self-pay | Admitting: Internal Medicine

## 2024-09-05 ENCOUNTER — Telehealth: Payer: Self-pay | Admitting: Internal Medicine

## 2024-09-05 MED ORDER — PANTOPRAZOLE SODIUM 40 MG PO TBEC: 40.0000 mg | DELAYED_RELEASE_TABLET | Freq: Two times a day (BID) | ORAL | 0 refills | Status: AC

## 2024-09-05 NOTE — Telephone Encounter (Signed)
 Inbound call from patient stating that she is needing a refill on her pantoprazole  40 MG. Patient was scheduled for an OV with Dr. Abran per her request and did advise his PA had sooner appointment times patient did not want to schedule with PA. Patient was scheduled for March the 18 th. Please advise.

## 2024-09-05 NOTE — Telephone Encounter (Signed)
 Pantoprazole  refilled.

## 2024-11-16 ENCOUNTER — Ambulatory Visit: Admitting: Internal Medicine
